# Patient Record
Sex: Female | Born: 1955
Health system: Southern US, Community
[De-identification: ages and names within clinical notes are randomized; demographics above are authoritative.]

## PROBLEM LIST (undated history)

## (undated) DIAGNOSIS — R0981 Nasal congestion: Secondary | ICD-10-CM

## (undated) DIAGNOSIS — C50912 Malignant neoplasm of unspecified site of left female breast: Secondary | ICD-10-CM

## (undated) DIAGNOSIS — E559 Vitamin D deficiency, unspecified: Secondary | ICD-10-CM

## (undated) DIAGNOSIS — I10 Essential (primary) hypertension: Secondary | ICD-10-CM

## (undated) DIAGNOSIS — E785 Hyperlipidemia, unspecified: Secondary | ICD-10-CM

## (undated) DIAGNOSIS — R002 Palpitations: Secondary | ICD-10-CM

## (undated) DIAGNOSIS — R059 Cough, unspecified: Secondary | ICD-10-CM

## (undated) DIAGNOSIS — M2550 Pain in unspecified joint: Secondary | ICD-10-CM

## (undated) DIAGNOSIS — N83209 Unspecified ovarian cyst, unspecified side: Secondary | ICD-10-CM

## (undated) DIAGNOSIS — F419 Anxiety disorder, unspecified: Secondary | ICD-10-CM

## (undated) DIAGNOSIS — R05 Cough: Secondary | ICD-10-CM

## (undated) DIAGNOSIS — N979 Female infertility, unspecified: Secondary | ICD-10-CM

## (undated) DIAGNOSIS — Z923 Personal history of irradiation: Secondary | ICD-10-CM

## (undated) HISTORY — DX: Pain in unspecified joint: M25.50

## (undated) HISTORY — DX: Vitamin D deficiency, unspecified: E55.9

## (undated) HISTORY — DX: Palpitations: R00.2

## (undated) HISTORY — DX: Essential (primary) hypertension: I10

## (undated) HISTORY — DX: Female infertility, unspecified: N97.9

## (undated) HISTORY — DX: Hyperlipidemia, unspecified: E78.5

---

## 1988-06-02 HISTORY — PX: DILATION AND CURETTAGE OF UTERUS: SHX78

## 1997-08-03 ENCOUNTER — Ambulatory Visit (HOSPITAL_COMMUNITY): Admission: RE | Admit: 1997-08-03 | Discharge: 1997-08-03 | Payer: Self-pay | Admitting: Family Medicine

## 1998-12-21 ENCOUNTER — Ambulatory Visit (HOSPITAL_COMMUNITY): Admission: RE | Admit: 1998-12-21 | Discharge: 1998-12-21 | Payer: Self-pay | Admitting: Family Medicine

## 2000-01-15 ENCOUNTER — Ambulatory Visit (HOSPITAL_COMMUNITY): Admission: RE | Admit: 2000-01-15 | Discharge: 2000-01-15 | Payer: Self-pay | Admitting: Family Medicine

## 2000-01-15 ENCOUNTER — Encounter: Payer: Self-pay | Admitting: Family Medicine

## 2000-08-31 ENCOUNTER — Encounter: Admission: RE | Admit: 2000-08-31 | Discharge: 2000-11-29 | Payer: Self-pay | Admitting: Family Medicine

## 2001-09-27 ENCOUNTER — Encounter: Payer: Self-pay | Admitting: Family Medicine

## 2001-09-27 ENCOUNTER — Ambulatory Visit (HOSPITAL_COMMUNITY): Admission: RE | Admit: 2001-09-27 | Discharge: 2001-09-27 | Payer: Self-pay | Admitting: Family Medicine

## 2001-10-22 ENCOUNTER — Other Ambulatory Visit: Admission: RE | Admit: 2001-10-22 | Discharge: 2001-10-22 | Payer: Self-pay | Admitting: Family Medicine

## 2003-01-02 ENCOUNTER — Encounter: Payer: Self-pay | Admitting: Family Medicine

## 2003-01-02 ENCOUNTER — Ambulatory Visit (HOSPITAL_COMMUNITY): Admission: RE | Admit: 2003-01-02 | Discharge: 2003-01-02 | Payer: Self-pay | Admitting: Family Medicine

## 2003-06-21 ENCOUNTER — Other Ambulatory Visit: Admission: RE | Admit: 2003-06-21 | Discharge: 2003-06-21 | Payer: Self-pay | Admitting: Family Medicine

## 2004-07-04 ENCOUNTER — Ambulatory Visit (HOSPITAL_COMMUNITY): Admission: RE | Admit: 2004-07-04 | Discharge: 2004-07-04 | Payer: Self-pay | Admitting: Family Medicine

## 2004-08-06 ENCOUNTER — Other Ambulatory Visit: Admission: RE | Admit: 2004-08-06 | Discharge: 2004-08-06 | Payer: Self-pay | Admitting: Family Medicine

## 2005-03-19 ENCOUNTER — Other Ambulatory Visit: Admission: RE | Admit: 2005-03-19 | Discharge: 2005-03-19 | Payer: Self-pay | Admitting: Obstetrics and Gynecology

## 2005-08-27 ENCOUNTER — Ambulatory Visit (HOSPITAL_COMMUNITY): Admission: RE | Admit: 2005-08-27 | Discharge: 2005-08-27 | Payer: Self-pay | Admitting: Family Medicine

## 2005-09-23 ENCOUNTER — Encounter: Admission: RE | Admit: 2005-09-23 | Discharge: 2005-10-08 | Payer: Self-pay | Admitting: Family Medicine

## 2005-09-25 ENCOUNTER — Other Ambulatory Visit: Admission: RE | Admit: 2005-09-25 | Discharge: 2005-09-25 | Payer: Self-pay | Admitting: Obstetrics and Gynecology

## 2006-02-11 ENCOUNTER — Ambulatory Visit (HOSPITAL_COMMUNITY): Admission: RE | Admit: 2006-02-11 | Discharge: 2006-02-11 | Payer: Self-pay | Admitting: Gastroenterology

## 2006-02-11 ENCOUNTER — Encounter (INDEPENDENT_AMBULATORY_CARE_PROVIDER_SITE_OTHER): Payer: Self-pay | Admitting: Specialist

## 2006-09-02 ENCOUNTER — Ambulatory Visit (HOSPITAL_COMMUNITY): Admission: RE | Admit: 2006-09-02 | Discharge: 2006-09-02 | Payer: Self-pay | Admitting: Family Medicine

## 2007-11-25 ENCOUNTER — Ambulatory Visit (HOSPITAL_COMMUNITY): Admission: RE | Admit: 2007-11-25 | Discharge: 2007-11-25 | Payer: Self-pay | Admitting: Family Medicine

## 2008-03-27 ENCOUNTER — Other Ambulatory Visit: Admission: RE | Admit: 2008-03-27 | Discharge: 2008-03-27 | Payer: Self-pay | Admitting: Family Medicine

## 2008-06-13 ENCOUNTER — Ambulatory Visit: Payer: Self-pay | Admitting: Family Medicine

## 2008-10-02 ENCOUNTER — Emergency Department (HOSPITAL_COMMUNITY): Admission: EM | Admit: 2008-10-02 | Discharge: 2008-10-02 | Payer: Self-pay | Admitting: Emergency Medicine

## 2009-01-11 ENCOUNTER — Ambulatory Visit (HOSPITAL_COMMUNITY): Admission: RE | Admit: 2009-01-11 | Discharge: 2009-01-11 | Payer: Self-pay | Admitting: Family Medicine

## 2009-03-27 ENCOUNTER — Encounter: Payer: Self-pay | Admitting: Internal Medicine

## 2009-04-13 DIAGNOSIS — E669 Obesity, unspecified: Secondary | ICD-10-CM

## 2009-04-13 DIAGNOSIS — J309 Allergic rhinitis, unspecified: Secondary | ICD-10-CM | POA: Insufficient documentation

## 2009-04-13 DIAGNOSIS — E7800 Pure hypercholesterolemia, unspecified: Secondary | ICD-10-CM | POA: Insufficient documentation

## 2009-04-13 DIAGNOSIS — F411 Generalized anxiety disorder: Secondary | ICD-10-CM | POA: Insufficient documentation

## 2009-04-13 DIAGNOSIS — F329 Major depressive disorder, single episode, unspecified: Secondary | ICD-10-CM

## 2009-04-13 DIAGNOSIS — I10 Essential (primary) hypertension: Secondary | ICD-10-CM | POA: Insufficient documentation

## 2009-04-16 ENCOUNTER — Ambulatory Visit: Payer: Self-pay | Admitting: Internal Medicine

## 2009-04-16 DIAGNOSIS — R002 Palpitations: Secondary | ICD-10-CM | POA: Insufficient documentation

## 2009-04-19 ENCOUNTER — Ambulatory Visit: Payer: Self-pay

## 2009-05-03 ENCOUNTER — Ambulatory Visit (HOSPITAL_COMMUNITY): Admission: RE | Admit: 2009-05-03 | Discharge: 2009-05-03 | Payer: Self-pay | Admitting: Internal Medicine

## 2009-05-03 ENCOUNTER — Encounter: Payer: Self-pay | Admitting: Internal Medicine

## 2009-05-03 ENCOUNTER — Ambulatory Visit: Payer: Self-pay

## 2009-05-03 ENCOUNTER — Ambulatory Visit: Payer: Self-pay | Admitting: Cardiology

## 2009-05-10 ENCOUNTER — Other Ambulatory Visit: Admission: RE | Admit: 2009-05-10 | Discharge: 2009-05-10 | Payer: Self-pay | Admitting: Family Medicine

## 2009-06-04 ENCOUNTER — Ambulatory Visit: Payer: Self-pay | Admitting: Internal Medicine

## 2010-02-07 ENCOUNTER — Ambulatory Visit (HOSPITAL_COMMUNITY): Admission: RE | Admit: 2010-02-07 | Discharge: 2010-02-07 | Payer: Self-pay | Admitting: Family Medicine

## 2010-07-04 ENCOUNTER — Other Ambulatory Visit: Payer: Self-pay | Admitting: Family Medicine

## 2010-07-04 ENCOUNTER — Other Ambulatory Visit (HOSPITAL_COMMUNITY)
Admission: RE | Admit: 2010-07-04 | Discharge: 2010-07-04 | Disposition: A | Discharge status: Home / Self Care | Payer: 59 | Source: Ambulatory Visit | Attending: Family Medicine | Admitting: Family Medicine

## 2010-07-04 DIAGNOSIS — Z124 Encounter for screening for malignant neoplasm of cervix: Secondary | ICD-10-CM | POA: Insufficient documentation

## 2010-07-04 NOTE — Letter (Signed)
Summary: [facility]   Imported By: Kassie Mends 07/23/2009 13:11:29  _____________________________________________________________________  External Attachment:    Type:   Image     Comment:   External Document

## 2010-07-04 NOTE — Assessment & Plan Note (Signed)
Summary: 6wk f/u pt needed to wait until 1st week in january/sl   Visit Type:  Follow-up Referring Provider:  Mila Palmer, MD Primary Provider:  Shaune Pollack, MD [facility])   History of Present Illness: The patient presents today for routine electrophysiology followup. She reports doing very well since last being seen in our clinic. Her palpitations have resolved.  The patient denies symptoms of chest pain, shortness of breath, orthopnea, PND, lower extremity edema, dizziness, presyncope, syncope, or neurologic sequela. The patient is tolerating medications without difficulties and is otherwise without complaint today.   She has significant family/ social stress but feels that this is improving.  Current Medications (verified): 1)  Lisinopril 10 Mg Tabs (Lisinopril) .... Take One Tablet By Mouth Daily 2)  Simvastatin 40 Mg Tabs (Simvastatin) .... Take One Tablet By Mouth Daily At Bedtime 3)  Vitamin D (Ergocalciferol) 50000 Unit Caps (Ergocalciferol) .... Weekly 4)  Multivitamins   Tabs (Multiple Vitamin) .... Once Daily -- Sometimes 5)  Wellbutrin  150 Mg Xr24h-Tab (Bupropion Hcl) .... Take One Tablet By Mouth Once Daily.  Allergies (verified): No Known Drug Allergies  Past History:  Past Medical History: hypercholesterolemia allergic rhinitis hypertension depression obesity palpitations  Social History: Reviewed history from 04/16/2009 and no changes required. Pt lives in [place] with husband and 2 daughters. 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 Tobacco Use - quit 1982 Alcohol Use - rare (once per month) Drug Use - none  Vital Signs:  Patient profile:   54 year old female Height:      64 inches Weight:      193 pounds BMI:     33.25 Pulse rate:   86 / minute BP sitting:   132 / 82  Vitals Entered By: Laurance Flatten CMA (June 04, 2009 4:38 PM)  Physical Exam  General:  tearful, obese, NAD Head:  normocephalic and atraumatic Eyes:   PERRLA/EOM intact; conjunctiva and lids normal. Nose:  no deformity, discharge, inflammation, or lesions Mouth:  Teeth, gums and palate normal. Oral mucosa normal. Neck:  Neck supple, no JVD. No masses, thyromegaly or abnormal cervical nodes. Lungs:  Clear bilaterally to auscultation and percussion. Heart:  Non-displaced PMI, chest non-tender; regular rate and rhythm, S1, S2 without murmurs, rubs or gallops. Carotid upstroke normal, no bruit. Normal abdominal aortic size, no bruits. Femorals normal pulses, no bruits. Pedals normal pulses. No edema, no varicosities. Abdomen:  Bowel sounds positive; abdomen soft and non-tender without masses, organomegaly, or hernias noted. No hepatosplenomegaly. Msk:  Back normal, normal gait. Muscle strength and tone normal. Pulses:  pulses normal in all 4 extremities Extremities:  No clubbing or cyanosis. Neurologic:  Alert and oriented x 3. Skin:  Intact without lesions or rashes. Cervical Nodes:  no significant adenopathy Psych:  tearful,  denies SI/HI   Impression & Recommendations:  Problem # 1:  PALPITATIONS (ICD-785.1) Recent event monitor was unremarkable.  No arrhythmias documented.  Her echo reveals no significant abnormalities. Her symptoms have improved.  NO further follow-up is recommended.  Problem # 2:  OBESITY (ICD-278.00) lifestyle modification advised  Problem # 3:  HYPERTENSION (ICD-401.9) stable, no change  The following medications were removed from the medication list:    Aspirin 81 Mg Tbec (Aspirin) .[name]... Take one tablet by mouth every other day Her updated medication list for this problem includes:    Lisinopril 10 Mg Tabs (Lisinopril) .[name]... Take one tablet by mouth daily  Problem # 4:  DEPRESSION (ICD-311) Pt to follow-up with  PCP  Patient Instructions: 1)  return as needed

## 2010-10-18 NOTE — Op Note (Signed)
NAMEAILEY, Connie Clarke                ACCOUNT NO.:  [id number]   MEDICAL RECORD NO.:  [id number]          PATIENT TYPE:  AMB   LOCATION:  ENDO                         FACILITY:  [facility]   PHYSICIAN:  Bernette Redbird, M.D.   DATE OF BIRTH:  08-Mar-1956   DATE OF PROCEDURE:  02/11/2006  DATE OF DISCHARGE:                                 OPERATIVE REPORT   PROCEDURE:  Colonoscopy with biopsy.   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  There is a family history of colon cancer in her  maternal grandfather, probably when he was in his 32s.   FINDINGS:  Diminutive rectal polyp.   PROCEDURE:  The nature, purpose, risks of the procedure had been discussed  with the patient through our open access program, and I reviewed the purpose  and risks with her prior to the procedure.  She had provided written  consent.   SEDATION:  Prior to and during the course of the procedure totaled fentanyl  25 mg, fentanyl 75 mcg and Versed 6 mg IV without arrhythmias or  desaturation.   The Olympus adjustable tension pediatric video colonoscope was advanced with  slight difficulty through a slightly fixated and looping sigmoid region, and  then fairly easily around the colon to the cecum, as identified by clear  visualization of the appendiceal orifice and ileocecal valve.  The terminal  ileum was not entered.  The quality of prep was excellent and it is felt  that all areas were well seen.   In the rectum was a 3-mm sessile polyp, removed by several cold biopsies.  No other polyps were seen, and there was no evidence of cancer, colitis,  vascular malformations or diverticulosis.  Retroflexion in the rectum, prior  to the biopsy was negative, and reinspection of the rectum showed no  additional abnormalities.   The patient tolerated the procedure well and there no apparent  complications.   IMPRESSION:  1. Solitary diminutive rectal polyp (211.4).  2. Remote family history of colon cancer.   PLAN:  1. Await pathology results.  2. Probable colonoscopic follow-up in 5 years in view of the family      history, regardless of the histology of her polyp.           ______________________________  Bernette Redbird, M.D.     RB/MEDQ  D:  02/11/2006  T:  02/11/2006  Job:  [id number]   cc:   Duncan Dull, M.D.

## 2011-03-04 ENCOUNTER — Other Ambulatory Visit (HOSPITAL_COMMUNITY): Payer: Self-pay | Admitting: Family Medicine

## 2011-03-04 DIAGNOSIS — Z1231 Encounter for screening mammogram for malignant neoplasm of breast: Secondary | ICD-10-CM

## 2011-03-05 ENCOUNTER — Ambulatory Visit (HOSPITAL_COMMUNITY)
Admission: RE | Admit: 2011-03-05 | Discharge: 2011-03-05 | Disposition: A | Discharge status: Home / Self Care | Payer: 59 | Source: Ambulatory Visit | Attending: Family Medicine | Admitting: Family Medicine

## 2011-03-05 DIAGNOSIS — Z1231 Encounter for screening mammogram for malignant neoplasm of breast: Secondary | ICD-10-CM | POA: Insufficient documentation

## 2012-06-07 ENCOUNTER — Other Ambulatory Visit (HOSPITAL_COMMUNITY): Payer: Self-pay | Admitting: Family Medicine

## 2012-06-07 DIAGNOSIS — Z1231 Encounter for screening mammogram for malignant neoplasm of breast: Secondary | ICD-10-CM

## 2012-06-16 ENCOUNTER — Ambulatory Visit (HOSPITAL_COMMUNITY)
Admission: RE | Admit: 2012-06-16 | Discharge: 2012-06-16 | Disposition: A | Discharge status: Home / Self Care | Payer: 59 | Source: Ambulatory Visit | Attending: Family Medicine | Admitting: Family Medicine

## 2012-06-16 DIAGNOSIS — Z1231 Encounter for screening mammogram for malignant neoplasm of breast: Secondary | ICD-10-CM | POA: Insufficient documentation

## 2013-07-13 ENCOUNTER — Other Ambulatory Visit (HOSPITAL_COMMUNITY)
Admission: RE | Admit: 2013-07-13 | Discharge: 2013-07-13 | Disposition: A | Discharge status: Home / Self Care | Payer: 59 | Source: Ambulatory Visit | Attending: Family Medicine | Admitting: Family Medicine

## 2013-07-13 ENCOUNTER — Other Ambulatory Visit: Payer: Self-pay | Admitting: Family Medicine

## 2013-07-13 DIAGNOSIS — Z124 Encounter for screening for malignant neoplasm of cervix: Secondary | ICD-10-CM | POA: Insufficient documentation

## 2013-07-18 ENCOUNTER — Other Ambulatory Visit (HOSPITAL_COMMUNITY): Payer: Self-pay | Admitting: Family Medicine

## 2013-07-18 DIAGNOSIS — Z1231 Encounter for screening mammogram for malignant neoplasm of breast: Secondary | ICD-10-CM

## 2013-07-25 ENCOUNTER — Ambulatory Visit (HOSPITAL_COMMUNITY)
Admission: RE | Admit: 2013-07-25 | Discharge: 2013-07-25 | Disposition: A | Discharge status: Home / Self Care | Payer: 59 | Source: Ambulatory Visit | Attending: Family Medicine | Admitting: Family Medicine

## 2013-07-25 DIAGNOSIS — Z1231 Encounter for screening mammogram for malignant neoplasm of breast: Secondary | ICD-10-CM | POA: Insufficient documentation

## 2014-07-10 ENCOUNTER — Other Ambulatory Visit (HOSPITAL_COMMUNITY): Payer: Self-pay | Admitting: Family Medicine

## 2014-07-10 DIAGNOSIS — Z1231 Encounter for screening mammogram for malignant neoplasm of breast: Secondary | ICD-10-CM

## 2014-08-02 ENCOUNTER — Ambulatory Visit (HOSPITAL_COMMUNITY)
Admission: RE | Admit: 2014-08-02 | Discharge: 2014-08-02 | Disposition: A | Discharge status: Home / Self Care | Payer: 59 | Source: Ambulatory Visit | Attending: Family Medicine | Admitting: Family Medicine

## 2014-08-02 DIAGNOSIS — Z1231 Encounter for screening mammogram for malignant neoplasm of breast: Secondary | ICD-10-CM | POA: Diagnosis present

## 2015-06-18 DIAGNOSIS — Z9889 Other specified postprocedural states: Secondary | ICD-10-CM | POA: Diagnosis not present

## 2015-06-18 DIAGNOSIS — M25561 Pain in right knee: Secondary | ICD-10-CM | POA: Diagnosis not present

## 2015-06-20 DIAGNOSIS — M25561 Pain in right knee: Secondary | ICD-10-CM | POA: Diagnosis not present

## 2015-06-20 DIAGNOSIS — Z9889 Other specified postprocedural states: Secondary | ICD-10-CM | POA: Diagnosis not present

## 2015-06-25 DIAGNOSIS — M25561 Pain in right knee: Secondary | ICD-10-CM | POA: Diagnosis not present

## 2015-06-25 DIAGNOSIS — Z9889 Other specified postprocedural states: Secondary | ICD-10-CM | POA: Diagnosis not present

## 2015-06-27 DIAGNOSIS — M25561 Pain in right knee: Secondary | ICD-10-CM | POA: Diagnosis not present

## 2015-06-27 DIAGNOSIS — Z9889 Other specified postprocedural states: Secondary | ICD-10-CM | POA: Diagnosis not present

## 2015-06-28 MED FILL — ESTRADIOL 0.075 MG PATCH: 0.075 | 56 days supply | Qty: 16 | Fill #0

## 2015-06-28 MED FILL — PROGESTERONE 100 MG CAPSULE: 100 | 30 days supply | Qty: 90 | Fill #0

## 2015-07-10 DIAGNOSIS — L814 Other melanin hyperpigmentation: Secondary | ICD-10-CM | POA: Diagnosis not present

## 2015-07-10 DIAGNOSIS — L720 Epidermal cyst: Secondary | ICD-10-CM | POA: Diagnosis not present

## 2015-07-10 DIAGNOSIS — D1801 Hemangioma of skin and subcutaneous tissue: Secondary | ICD-10-CM | POA: Diagnosis not present

## 2015-07-11 DIAGNOSIS — H52223 Regular astigmatism, bilateral: Secondary | ICD-10-CM | POA: Diagnosis not present

## 2015-07-11 DIAGNOSIS — H25013 Cortical age-related cataract, bilateral: Secondary | ICD-10-CM | POA: Diagnosis not present

## 2015-07-11 DIAGNOSIS — H5213 Myopia, bilateral: Secondary | ICD-10-CM | POA: Diagnosis not present

## 2015-07-11 DIAGNOSIS — H40013 Open angle with borderline findings, low risk, bilateral: Secondary | ICD-10-CM | POA: Diagnosis not present

## 2015-07-16 DIAGNOSIS — Z01419 Encounter for gynecological examination (general) (routine) without abnormal findings: Secondary | ICD-10-CM | POA: Diagnosis not present

## 2015-07-16 DIAGNOSIS — N840 Polyp of corpus uteri: Secondary | ICD-10-CM | POA: Diagnosis not present

## 2015-07-16 DIAGNOSIS — N889 Noninflammatory disorder of cervix uteri, unspecified: Secondary | ICD-10-CM | POA: Diagnosis not present

## 2015-07-23 ENCOUNTER — Other Ambulatory Visit: Payer: Self-pay

## 2015-07-23 DIAGNOSIS — Z1231 Encounter for screening mammogram for malignant neoplasm of breast: Secondary | ICD-10-CM

## 2015-07-23 DIAGNOSIS — Z13820 Encounter for screening for osteoporosis: Secondary | ICD-10-CM | POA: Diagnosis not present

## 2015-07-30 MED FILL — PROGESTERONE 100 MG CAPSULE: 100 | 30 days supply | Qty: 90 | Fill #1

## 2015-08-07 DIAGNOSIS — F39 Unspecified mood [affective] disorder: Secondary | ICD-10-CM | POA: Diagnosis not present

## 2015-08-07 DIAGNOSIS — N951 Menopausal and female climacteric states: Secondary | ICD-10-CM | POA: Diagnosis not present

## 2015-08-07 DIAGNOSIS — E559 Vitamin D deficiency, unspecified: Secondary | ICD-10-CM | POA: Diagnosis not present

## 2015-08-07 DIAGNOSIS — R7302 Impaired glucose tolerance (oral): Secondary | ICD-10-CM | POA: Diagnosis not present

## 2015-08-09 MED FILL — LISINOPRIL 10 MG TABLET: 10 | 90 days supply | Qty: 90 | Fill #0

## 2015-08-10 ENCOUNTER — Ambulatory Visit: Payer: 59

## 2015-08-13 ENCOUNTER — Ambulatory Visit
Admission: RE | Admit: 2015-08-13 | Discharge: 2015-08-13 | Disposition: A | Discharge status: Home / Self Care | Payer: 59 | Source: Ambulatory Visit

## 2015-08-13 DIAGNOSIS — Z1231 Encounter for screening mammogram for malignant neoplasm of breast: Secondary | ICD-10-CM | POA: Diagnosis not present

## 2015-08-13 MED FILL — ESTRADIOL 0.1 MG PATCH: 0.1 | 28 days supply | Qty: 8 | Fill #0

## 2015-08-13 MED FILL — NATURE-THROID 32.5 MG TAB: 32.5 | 30 days supply | Qty: 30 | Fill #0

## 2015-08-31 DIAGNOSIS — H5213 Myopia, bilateral: Secondary | ICD-10-CM | POA: Diagnosis not present

## 2015-08-31 DIAGNOSIS — Z83511 Family history of glaucoma: Secondary | ICD-10-CM | POA: Diagnosis not present

## 2015-08-31 DIAGNOSIS — H524 Presbyopia: Secondary | ICD-10-CM | POA: Diagnosis not present

## 2015-08-31 DIAGNOSIS — H52223 Regular astigmatism, bilateral: Secondary | ICD-10-CM | POA: Diagnosis not present

## 2015-08-31 DIAGNOSIS — H40013 Open angle with borderline findings, low risk, bilateral: Secondary | ICD-10-CM | POA: Diagnosis not present

## 2015-09-05 MED FILL — PROGESTERONE 100 MG CAPSULE: 100 | 30 days supply | Qty: 90 | Fill #2

## 2015-09-10 DIAGNOSIS — F329 Major depressive disorder, single episode, unspecified: Secondary | ICD-10-CM | POA: Diagnosis not present

## 2015-09-10 DIAGNOSIS — Z8639 Personal history of other endocrine, nutritional and metabolic disease: Secondary | ICD-10-CM | POA: Diagnosis not present

## 2015-09-10 DIAGNOSIS — Z0000 Encounter for general adult medical examination without abnormal findings: Secondary | ICD-10-CM | POA: Diagnosis not present

## 2015-09-10 DIAGNOSIS — E7800 Pure hypercholesterolemia, unspecified: Secondary | ICD-10-CM | POA: Diagnosis not present

## 2015-09-10 DIAGNOSIS — E559 Vitamin D deficiency, unspecified: Secondary | ICD-10-CM | POA: Diagnosis not present

## 2015-09-10 DIAGNOSIS — R0683 Snoring: Secondary | ICD-10-CM | POA: Diagnosis not present

## 2015-09-10 DIAGNOSIS — I10 Essential (primary) hypertension: Secondary | ICD-10-CM | POA: Diagnosis not present

## 2015-09-10 LAB — LIPID PANEL
CHOLESTEROL: 201 — AB (ref 0–200)
HDL: 53 (ref 35–70)
LDL CALC: 133
TRIGLYCERIDES: 74 (ref 40–160)

## 2015-09-10 LAB — VITAMIN D 25 HYDROXY (VIT D DEFICIENCY, FRACTURES): Vit D, 25-Hydroxy: 45.9

## 2015-09-10 LAB — HEPATIC FUNCTION PANEL
ALK PHOS: 66 (ref 25–125)
ALT: 24 (ref 7–35)
AST: 17 (ref 13–35)
BILIRUBIN, TOTAL: 0.6

## 2015-09-10 LAB — BASIC METABOLIC PANEL
BUN: 27 — AB (ref 4–21)
Creatinine: 0.8 (ref 0.5–1.1)
Glucose: 100
Potassium: 4.9 (ref 3.4–5.3)
SODIUM: 136 — AB (ref 137–147)

## 2015-09-10 LAB — HEMOGLOBIN A1C: Hemoglobin A1C: 5.4

## 2015-09-10 LAB — TSH: TSH: 1.19 (ref 0.41–5.90)

## 2015-10-08 MED FILL — PROGESTERONE 100 MG CAPSULE: 100 | 30 days supply | Qty: 90 | Fill #3

## 2015-10-08 MED FILL — ESTRADIOL 0.075 MG PATCH: 0.075 | 28 days supply | Qty: 8 | Fill #0

## 2015-11-06 MED FILL — PROGESTERONE 100 MG CAPSULE: 100 | 30 days supply | Qty: 90 | Fill #4

## 2015-11-07 MED FILL — ESTRADIOL 0.075 MG PATCH: 0.075 | 28 days supply | Qty: 8 | Fill #1

## 2015-11-09 DIAGNOSIS — F39 Unspecified mood [affective] disorder: Secondary | ICD-10-CM | POA: Diagnosis not present

## 2015-11-09 DIAGNOSIS — Z8639 Personal history of other endocrine, nutritional and metabolic disease: Secondary | ICD-10-CM | POA: Diagnosis not present

## 2015-11-09 DIAGNOSIS — E559 Vitamin D deficiency, unspecified: Secondary | ICD-10-CM | POA: Diagnosis not present

## 2015-11-09 DIAGNOSIS — E669 Obesity, unspecified: Secondary | ICD-10-CM | POA: Diagnosis not present

## 2015-11-09 DIAGNOSIS — N951 Menopausal and female climacteric states: Secondary | ICD-10-CM | POA: Diagnosis not present

## 2015-11-09 DIAGNOSIS — R5383 Other fatigue: Secondary | ICD-10-CM | POA: Diagnosis not present

## 2015-11-09 DIAGNOSIS — R7302 Impaired glucose tolerance (oral): Secondary | ICD-10-CM | POA: Diagnosis not present

## 2015-11-27 MED FILL — PROGESTERONE 100 MG CAPSULE: 100 | 30 days supply | Qty: 90 | Fill #5

## 2015-11-27 MED FILL — LISINOPRIL 10 MG TABLET: 10 | 90 days supply | Qty: 90 | Fill #0

## 2015-12-11 MED FILL — ESTRADIOL 0.075 MG PATCH: 0.075 | 28 days supply | Qty: 8 | Fill #2

## 2016-01-04 MED FILL — ESTRADIOL 0.075 MG PATCH: 0.075 | 28 days supply | Qty: 8 | Fill #3

## 2016-01-08 MED FILL — PROGESTERONE 100 MG CAPSULE: 100 | 30 days supply | Qty: 90 | Fill #0

## 2016-02-15 MED FILL — ESTRADIOL 0.075 MG PATCH: 0.075 | 28 days supply | Qty: 8 | Fill #4

## 2016-03-12 MED FILL — PROGESTERONE 100 MG CAPSULE: 100 | 30 days supply | Qty: 90 | Fill #1

## 2016-03-12 MED FILL — LISINOPRIL 10 MG TABLET: 10 | 90 days supply | Qty: 90 | Fill #1

## 2016-03-12 MED FILL — ESTRADIOL 0.075 MG PATCH: 0.075 | 28 days supply | Qty: 8 | Fill #5

## 2016-04-09 MED FILL — PROGESTERONE 100 MG CAPSULE: 100 | 30 days supply | Qty: 90 | Fill #2

## 2016-04-09 MED FILL — ESTRADIOL 0.075 MG PATCH: 0.075 | 28 days supply | Qty: 8 | Fill #6

## 2016-05-12 DIAGNOSIS — N951 Menopausal and female climacteric states: Secondary | ICD-10-CM | POA: Diagnosis not present

## 2016-05-12 MED FILL — ESTRADIOL 0.05 MG PATCH: 0.05 | 84 days supply | Qty: 24 | Fill #0

## 2016-05-15 ENCOUNTER — Encounter (INDEPENDENT_AMBULATORY_CARE_PROVIDER_SITE_OTHER): Payer: 59 | Admitting: Family Medicine

## 2016-05-15 MED FILL — PROGESTERONE 100 MG CAPSULE: 100 | 90 days supply | Qty: 90 | Fill #0

## 2016-05-15 MED FILL — LISINOPRIL 10 MG TABLET: 10 | 90 days supply | Qty: 90 | Fill #2

## 2016-05-21 ENCOUNTER — Ambulatory Visit (INDEPENDENT_AMBULATORY_CARE_PROVIDER_SITE_OTHER): Payer: 59 | Admitting: Family Medicine

## 2016-05-21 ENCOUNTER — Encounter (INDEPENDENT_AMBULATORY_CARE_PROVIDER_SITE_OTHER): Payer: Self-pay | Admitting: Family Medicine

## 2016-05-21 VITALS — BP 146/76 | HR 81 | Temp 97.1°F | Resp 12 | Ht 64.0 in | Wt 201.0 lb

## 2016-05-21 DIAGNOSIS — Z1331 Encounter for screening for depression: Secondary | ICD-10-CM

## 2016-05-21 DIAGNOSIS — I10 Essential (primary) hypertension: Secondary | ICD-10-CM | POA: Diagnosis not present

## 2016-05-21 DIAGNOSIS — Z6834 Body mass index (BMI) 34.0-34.9, adult: Secondary | ICD-10-CM | POA: Diagnosis not present

## 2016-05-21 DIAGNOSIS — R0602 Shortness of breath: Secondary | ICD-10-CM

## 2016-05-21 DIAGNOSIS — Z1389 Encounter for screening for other disorder: Secondary | ICD-10-CM

## 2016-05-21 DIAGNOSIS — E669 Obesity, unspecified: Secondary | ICD-10-CM

## 2016-05-21 DIAGNOSIS — E559 Vitamin D deficiency, unspecified: Secondary | ICD-10-CM

## 2016-05-21 DIAGNOSIS — Z9189 Other specified personal risk factors, not elsewhere classified: Secondary | ICD-10-CM | POA: Diagnosis not present

## 2016-05-21 DIAGNOSIS — R5383 Other fatigue: Secondary | ICD-10-CM | POA: Diagnosis not present

## 2016-05-21 NOTE — Progress Notes (Signed)
Office: [phone]  /  Fax: [phone]   HPI:   Chief Complaint: OBESITY  Connie Clarke (MR# [id number]) is a 60 y.o. female who presents on 05/22/2016 for obesity evaluation and treatment. Current BMI is Body mass index is 34.5 kg/m.[name] Connie Clarke has struggled with obesity for years and has been unsuccessful in either losing weight or maintaining long term weight loss. Connie Clarke attended our information session and states she is currently in the action stage of change and ready to dedicate time achieving and maintaining a healthier weight.  Connie Clarke states she has been heavy most of  her life,  Connie Clarke out frequently, notes sweet cravings, eats fast Clarke frequently, often eats larger meals than normal, is embarrassed by hear eating and feels judged.   Connie Clarke Connie Clarke feels her energy is lower than it should be. This has worsened with weight gain and has not worsened recently. Connie Clarke reports daytime somnolence and  reports waking up still tired. Connie Clarke is at risk for obstructive sleep apnea. Patent has a history of symptoms of daytime Connie Clarke. Connie Clarke generally gets 8 hours of sleep per night, and states they generally have generally restful sleep. Snoring is present. Apneic episodes is not present. Epworth Sleepiness Score is 10.  Dyspnea on exertion [name] notes increasing shortness of breath with exercising and seems to be worsening over time with weight gain. She notes getting out of breath sooner with activity than she used to. This has not gotten worse recently. Connie Clarke denies orthopnea.  Hypertension Connie Clarke is a 60 y.o. female with hypertension.  She is taking her  medications as instructed, no medication side effects noted, no TIA's, no chest pain on exertion. She is working weight loss to help control her blood pressure with the goal of decreasing her BP medications and decreasing her risk of heart attack and stroke. Connie Clarke blood pressure is not currently controlled today but she states  this is not her normal BP.  Vitamin D deficiency Connie Clarke has a diagnosis of vitamin D deficiency. She is currently taking vit D and denies nausea, vomiting or muscle weakness.   Depression Screen Connie Clarke and Mood (modified PHQ-9) score was  Depression screen PHQ 2/9 05/21/2016  Decreased Interest 2  Down, Depressed, Hopeless 3  PHQ - 2 Score 5  Altered sleeping 1  Tired, decreased energy 2  Change in appetite 3  Feeling bad or failure about yourself  3  Trouble concentrating 1  Moving slowly or fidgety/restless 1  Suicidal thoughts 0  PHQ-9 Score 16    ALLERGIES: Allergies not on file  MEDICATIONS: No current outpatient prescriptions on file prior to visit.   No current facility-administered medications on file prior to visit.     PAST MEDICAL HISTORY: Past Medical History:  Diagnosis Date  . Depression   . Hyperlipidemia   . Hypertension   . Infertility, female   . Joint pain   . Palpitations   . Vitamin D deficiency     PAST SURGICAL HISTORY: Past Surgical History:  Procedure Laterality Date  . NO PAST SURGERIES      SOCIAL HISTORY: Social History  Substance Use Topics  . Smoking status: Former Smoker    Quit date: 05/21/2014  . Smokeless tobacco: Never Used  . Alcohol use Not on file    FAMILY HISTORY: Family History  Problem Relation Age of Onset  . Stroke Mother   . Anxiety disorder Mother   . Obesity Mother   . Diabetes Father   .  Hyperlipidemia Father   . Stroke Father   . Depression Father   . Obesity Father     ROS: Review of Systems  Constitutional: Positive for malaise/Connie Clarke.  HENT:       Stuffiness  Eyes:       Wears glasses or contacts  Musculoskeletal: Positive for joint pain (Occasional).  Psychiatric/Behavioral: Positive for depression.       Frequent Tearfullness    PHYSICAL EXAM: Blood pressure (!) 146/76, pulse 81, temperature 97.1 F (36.2 C), temperature source Oral, resp. rate 12, height 5\' 4"  (1.626 m),  weight 201 lb (91.2 kg), last menstrual period 06/02/2005, SpO2 99 %. Body mass index is 34.5 kg/m. Physical Exam  Constitutional: She is oriented to person, place, and time. She appears well-developed and well-nourished.  HENT:  Head: Normocephalic and atraumatic.  Nose: Nose normal.  Eyes: EOM are normal. No scleral icterus.  Neck: Normal range of motion. Neck supple. No thyromegaly present.  Cardiovascular: Normal rate and regular rhythm.   Pulmonary/Chest: Effort normal and breath sounds normal.  Abdominal: Soft. There is no tenderness.  Musculoskeletal: Normal range of motion. She exhibits edema.  Neurological: She is alert and oriented to person, place, and time. Coordination normal.  Skin: Skin is warm and dry.  Psychiatric: She has a normal mood and affect. Her behavior is normal.  Vitals reviewed.   RECENT LABS AND TESTS: BMET    Component Value Date/Time   NA 140 05/21/2016 1504   K 4.6 05/21/2016 1504   CL 101 05/21/2016 1504   CO2 24 05/21/2016 1504   GLUCOSE 80 05/21/2016 1504   BUN 14 05/21/2016 1504   CREATININE 0.72 05/21/2016 1504   CALCIUM 9.6 05/21/2016 1504   GFRNONAA 91 05/21/2016 1504   GFRAA 105 05/21/2016 1504   Lab Results  Component Value Date   HGBA1C 5.4 05/21/2016   Lab Results  Component Value Date   INSULIN WILL FOLLOW 05/21/2016   CBC    Component Value Date/Time   WBC 7.3 05/21/2016 1504   RBC 4.97 05/21/2016 1504   HCT 43.4 05/21/2016 1504   MCV 87 05/21/2016 1504   MCH 29.6 05/21/2016 1504   MCHC 33.9 05/21/2016 1504   RDW 13.1 05/21/2016 1504   LYMPHSABS 1.9 05/21/2016 1504   EOSABS 0.1 05/21/2016 1504   BASOSABS 0.0 05/21/2016 1504   Iron/TIBC/Ferritin/ %Sat No results found for: IRON, TIBC, FERRITIN, IRONPCTSAT Lipid Panel     Component Value Date/Time   CHOL 236 (H) 05/21/2016 1504   TRIG 198 (H) 05/21/2016 1504   HDL 53 05/21/2016 1504   LDLCALC 143 (H) 05/21/2016 1504   Hepatic Function Panel     Component  Value Date/Time   PROT 6.8 05/21/2016 1504   ALBUMIN 4.3 05/21/2016 1504   AST 23 05/21/2016 1504   ALT 43 (H) 05/21/2016 1504   ALKPHOS 85 05/21/2016 1504   BILITOT 0.5 05/21/2016 1504      Component Value Date/Time   TSH 1.230 05/21/2016 1504    ECG  shows NSR with a rate of 76 INDIRECT CALORIMETER done today shows a VO2 of 232 and a REE of 1616.    ASSESSMENT AND PLAN: Other Connie Clarke - Plan: EKG 12-Lead, Comprehensive metabolic panel, Vitamin 123456, T3, T4, free, TSH, Insulin, random, Lipid Panel With LDL/HDL Ratio, Folate, Hemoglobin A1c, CBC With Differential  Shortness of breath - Plan: EKG 12-Lead  Essential hypertension - Plan: EKG 12-Lead, Comprehensive metabolic panel  Vitamin D deficiency - Plan: VITAMIN D  25 Hydroxy (Vit-D Deficiency, Fractures)  At risk for heart disease - Plan: EKG 12-Lead  Depression screening  Class 1 obesity with body mass index (BMI) of 34.0 to 34.9 in adult, unspecified obesity type, unspecified whether serious comorbidity present  PLAN:  Connie Clarke Amry was informed that her Connie Clarke may be related to obesity, depression or many other causes. Labs will be ordered, and in the meanwhile Connie Clarke has agreed to work on diet, exercise and weight loss to help with Connie Clarke. Proper sleep hygiene was discussed including the need for 7-8 hours of quality sleep each night. A sleep study was not ordered based on symptoms and Epworth score.  Exercise Induced Shortness of Breath Connie Clarke shortness of breath appears to be obesity related and exercise induced. She has agreed to work on weight loss and gradually increase exercise to treat her exercise induced shortness of breath. If Connie Clarke follows our instructions and loses weight without improvement of her shortness of breath, we will plan to refer to pulmonology. We will monitor this condition regularly. Connie Clarke agrees to this plan.  Hypertension We discussed sodium restriction, working on healthy weight loss,  and a regular exercise program as the means to achieve improved blood pressure control. Connie Clarke agreed with this plan and agreed to follow up as directed. We will continue to monitor her blood pressure as well as her progress with the above lifestyle modifications. She will continue her medications as prescribed and will watch for signs of hypotension as he continues his lifestyle modifications.   Vitamin D Deficiency Connie Clarke was informed that low vitamin D levels contributes to Connie Clarke and are associated with obesity, will check labs and follow  Risk counselling Cardiovascular risk counselling Connie Clarke was given extended (at least 15 minutes) coronary artery disease prevention counseling today. She is 60 y.o. female and has risk factors for heart disease including obesity. We discussed intensive lifestyle modifications today with an emphasis on specific weight loss instructions and strategies. Pt was also informed of the importance of increasing exercise and decreasing saturated fats to help prevent heart disease.  Depression Screen Connie Clarke had a positive depression screening. Depression is commonly associated with obesity and often results in emotional eating behaviors. We will monitor this closely and work on CBT to help improve the non-hunger eating patterns. Referral to Psychology may be required if no improvement is seen as she continues in our clinic.  Obesity Connie Clarke is currently in the action stage of change and her goal is to continue with weight loss efforts She has agreed to follow the Category 3 plan Connie Clarke has been instructed to work up to a goal of 150 minutes of combined cardio and strengthening exercise per week for weight loss and overall health benefits. We discussed the following Behavioral Modification Stratagies today: decreasing sodium intake  Connie Clarke has agreed to follow up with our clinic in 2 weeks. She was informed of the importance of frequent follow up visits to maximize her  success with intensive lifestyle modifications for her multiple health conditions. She was informed we would discuss her lab results at her next visit unless there is a critical issue that needs to be addressed sooner. Shalette agreed to keep her next visit at the agreed upon time to discuss these results.  I, Doreene Nest, am acting as scribe for Dennard Nip, MD  I have reviewed the above documentation for accuracy and completeness, and I agree with the above. -Dennard Nip, MD

## 2016-05-22 LAB — CBC WITH DIFFERENTIAL
BASOS ABS: 0 10*3/uL (ref 0.0–0.2)
Basos: 0 %
EOS (ABSOLUTE): 0.1 10*3/uL (ref 0.0–0.4)
Eos: 2 %
Hematocrit: 43.4 % (ref 34.0–46.6)
Hemoglobin: 14.7 g/dL (ref 11.1–15.9)
IMMATURE GRANS (ABS): 0 10*3/uL (ref 0.0–0.1)
Immature Granulocytes: 0 %
LYMPHS ABS: 1.9 10*3/uL (ref 0.7–3.1)
LYMPHS: 26 %
MCH: 29.6 pg (ref 26.6–33.0)
MCHC: 33.9 g/dL (ref 31.5–35.7)
MCV: 87 fL (ref 79–97)
Monocytes Absolute: 0.5 10*3/uL (ref 0.1–0.9)
Monocytes: 6 %
NEUTROS ABS: 4.8 10*3/uL (ref 1.4–7.0)
NEUTROS PCT: 66 %
RBC: 4.97 x10E6/uL (ref 3.77–5.28)
RDW: 13.1 % (ref 12.3–15.4)
WBC: 7.3 10*3/uL (ref 3.4–10.8)

## 2016-05-22 LAB — COMPREHENSIVE METABOLIC PANEL
ALK PHOS: 85 IU/L (ref 39–117)
ALT: 43 IU/L — AB (ref 0–32)
AST: 23 IU/L (ref 0–40)
Albumin/Globulin Ratio: 1.7 (ref 1.2–2.2)
Albumin: 4.3 g/dL (ref 3.6–4.8)
BUN/Creatinine Ratio: 19 (ref 12–28)
BUN: 14 mg/dL (ref 8–27)
Bilirubin Total: 0.5 mg/dL (ref 0.0–1.2)
CO2: 24 mmol/L (ref 18–29)
CREATININE: 0.72 mg/dL (ref 0.57–1.00)
Calcium: 9.6 mg/dL (ref 8.7–10.3)
Chloride: 101 mmol/L (ref 96–106)
GFR calc Af Amer: 105 mL/min/{1.73_m2} (ref >59)
GFR calc non Af Amer: 91 mL/min/{1.73_m2} (ref >59)
GLOBULIN, TOTAL: 2.5 g/dL (ref 1.5–4.5)
GLUCOSE: 80 mg/dL (ref 65–99)
Potassium: 4.6 mmol/L (ref 3.5–5.2)
SODIUM: 140 mmol/L (ref 134–144)
Total Protein: 6.8 g/dL (ref 6.0–8.5)

## 2016-05-22 LAB — LIPID PANEL WITH LDL/HDL RATIO
Cholesterol, Total: 236 mg/dL — ABNORMAL HIGH (ref 100–199)
HDL: 53 mg/dL (ref >39)
LDL Calculated: 143 mg/dL — ABNORMAL HIGH (ref 0–99)
LDl/HDL Ratio: 2.7 ratio units (ref 0.0–3.2)
Triglycerides: 198 mg/dL — ABNORMAL HIGH (ref 0–149)
VLDL CHOLESTEROL CAL: 40 mg/dL (ref 5–40)

## 2016-05-22 LAB — VITAMIN D 25 HYDROXY (VIT D DEFICIENCY, FRACTURES): VIT D 25 HYDROXY: 53.6 ng/mL (ref 30.0–100.0)

## 2016-05-22 LAB — T4, FREE: Free T4: 1.23 ng/dL (ref 0.82–1.77)

## 2016-05-22 LAB — VITAMIN B12: VITAMIN B 12: 446 pg/mL (ref 232–1245)

## 2016-05-22 LAB — INSULIN, RANDOM: INSULIN: 13.5 u[IU]/mL (ref 2.6–24.9)

## 2016-05-22 LAB — T3: T3 TOTAL: 147 ng/dL (ref 71–180)

## 2016-05-22 LAB — TSH: TSH: 1.23 u[IU]/mL (ref 0.450–4.500)

## 2016-05-22 LAB — FOLATE: FOLATE: 10 ng/mL (ref >3.0)

## 2016-05-22 LAB — HEMOGLOBIN A1C
Est. average glucose Bld gHb Est-mCnc: 108 mg/dL
HEMOGLOBIN A1C: 5.4 % (ref 4.8–5.6)

## 2016-06-09 ENCOUNTER — Ambulatory Visit (INDEPENDENT_AMBULATORY_CARE_PROVIDER_SITE_OTHER): Payer: 59 | Admitting: Family Medicine

## 2016-06-09 ENCOUNTER — Encounter (INDEPENDENT_AMBULATORY_CARE_PROVIDER_SITE_OTHER): Payer: Self-pay

## 2016-06-10 ENCOUNTER — Encounter (INDEPENDENT_AMBULATORY_CARE_PROVIDER_SITE_OTHER): Payer: Self-pay

## 2016-06-11 ENCOUNTER — Encounter (INDEPENDENT_AMBULATORY_CARE_PROVIDER_SITE_OTHER): Payer: Self-pay

## 2016-06-11 ENCOUNTER — Ambulatory Visit (INDEPENDENT_AMBULATORY_CARE_PROVIDER_SITE_OTHER): Payer: 59 | Admitting: Family Medicine

## 2016-06-16 ENCOUNTER — Ambulatory Visit (INDEPENDENT_AMBULATORY_CARE_PROVIDER_SITE_OTHER): Payer: 59 | Admitting: Family Medicine

## 2016-06-16 VITALS — BP 118/74 | HR 81 | Temp 97.9°F | Ht 64.0 in | Wt 197.0 lb

## 2016-06-16 DIAGNOSIS — E785 Hyperlipidemia, unspecified: Secondary | ICD-10-CM | POA: Diagnosis not present

## 2016-06-16 DIAGNOSIS — Z9189 Other specified personal risk factors, not elsewhere classified: Secondary | ICD-10-CM | POA: Diagnosis not present

## 2016-06-16 DIAGNOSIS — Z6833 Body mass index (BMI) 33.0-33.9, adult: Secondary | ICD-10-CM

## 2016-06-16 DIAGNOSIS — E669 Obesity, unspecified: Secondary | ICD-10-CM | POA: Diagnosis not present

## 2016-06-16 DIAGNOSIS — E559 Vitamin D deficiency, unspecified: Secondary | ICD-10-CM | POA: Diagnosis not present

## 2016-06-16 DIAGNOSIS — E8881 Metabolic syndrome: Secondary | ICD-10-CM | POA: Diagnosis not present

## 2016-06-16 NOTE — Progress Notes (Signed)
Office: [phone]  /  Fax: [phone]   HPI:   Chief Complaint: OBESITY Connie Clarke is here to discuss her progress with her obesity treatment plan. She is following her eating plan approximately 85 % of the time and states she is exercising 40 minutes 5-6 times per week. Connie Clarke struggled with following the plan while travelling, deviated significantly from the plan, and was bored with plan. Connie Clarke is walking for exercise. Her weight is 197 lb (89.4 kg) today and has had a weight loss of 4 pounds over a period of 3-4 weeks since her last visit. She has lost 4 lbs since starting treatment with [name].  Insulin Resistance Connie Clarke has a  New diagnosis of insulin resistance based on her elevated fasting insulin level >5. Positive polyphagia in evenings, positive family history of diabetes. Although Connie Clarke's blood glucose readings are still under good control, insulin resistance puts her at greater risk of metabolic syndrome and diabetes. She is not taking metformin currently and continues to work on diet and exercise to decrease risk of diabetes.  At risk for diabetes Connie Clarke is at higher than average risk for developing diabetes due to her obesity. She currently denies polyuria or polydipsia.  Hyperlipidemia Connie Clarke has hyperlipidemia, elevated LDL and triglycerides and has been trying to improve her cholesterol levels with intensive lifestyle modification including a low saturated fat diet, exercise and weight loss. She denies any chest pain, claudication or myalgias. Connie Clarke states she doesn't want to be on Statins due to family history of diabetes.  Vitamin D deficiency Connie Clarke has a diagnosis of vitamin D deficiency. She is currently taking vit D and denies nausea, vomiting or muscle weakness.  Wt Readings from Last 500 Encounters:  06/16/16 197 lb (89.4 kg)  05/21/16 201 lb (91.2 kg)  06/04/09 193 lb (87.5 kg)  04/16/09 193 lb (87.5 kg)     ALLERGIES: Allergies not on  file  MEDICATIONS: Current Outpatient Prescriptions on File Prior to Visit  Medication Sig Dispense Refill   Cholecalciferol ([employer] VIT D 5000 HIGH-POTENCY) 5000 units capsule Take 5,000 Units by mouth daily.     estradiol (VIVELLE-DOT) 0.05 MG/24HR patch Place 0.05 patches onto the skin 2 (two) times a week.  11   lisinopril (PRINIVIL,ZESTRIL) 10 MG tablet Take 10 mg by mouth every morning.  3   progesterone (PROMETRIUM) 100 MG capsule Take 100 mg by mouth daily.     No current facility-administered medications on file prior to visit.     PAST MEDICAL HISTORY: Past Medical History:  Diagnosis Date   Depression    Hyperlipidemia    Hypertension    Infertility, female    Joint pain    Palpitations    Vitamin D deficiency     PAST SURGICAL HISTORY: Past Surgical History:  Procedure Laterality Date   NO PAST SURGERIES      SOCIAL HISTORY: Social History  Substance Use Topics   Smoking status: Former Smoker    Quit date: 05/21/2014   Smokeless tobacco: Never Used   Alcohol use Not on file    FAMILY HISTORY: Family History  Problem Relation Age of Onset   Stroke Mother    Anxiety disorder Mother    Obesity Mother    Diabetes Father    Hyperlipidemia Father    Stroke Father    Depression Father    Obesity Father     ROS: Review of Systems  Constitutional: Positive for weight loss.  Cardiovascular: Negative for chest pain and claudication.  Genitourinary: Negative for frequency.  Musculoskeletal: Negative for myalgias.  Endo/Heme/Allergies: Negative for polydipsia.       Positive Polyphagia    PHYSICAL EXAM: Blood pressure 118/74, pulse 81, temperature 97.9 F (36.6 C), temperature source Oral, height 5\' 4"  (1.626 m), weight 197 lb (89.4 kg), last menstrual period 06/02/2005, SpO2 99 %. Body mass index is 33.81 kg/m. Physical Exam  Constitutional: She is oriented to person, place, and time. She appears well-developed and  well-nourished.  Cardiovascular: Normal rate.   Pulmonary/Chest: Effort normal.  Musculoskeletal: Normal range of motion.  Neurological: She is oriented to person, place, and time.  Skin: Skin is warm and dry.  Psychiatric: She has a normal mood and affect. Her behavior is normal.  Vitals reviewed.   RECENT LABS AND TESTS: BMET    Component Value Date/Time   NA 140 05/21/2016 1504   K 4.6 05/21/2016 1504   CL 101 05/21/2016 1504   CO2 24 05/21/2016 1504   GLUCOSE 80 05/21/2016 1504   BUN 14 05/21/2016 1504   CREATININE 0.72 05/21/2016 1504   CALCIUM 9.6 05/21/2016 1504   GFRNONAA 91 05/21/2016 1504   GFRAA 105 05/21/2016 1504   Lab Results  Component Value Date   HGBA1C 5.4 05/21/2016   Lab Results  Component Value Date   INSULIN 13.5 05/21/2016   CBC    Component Value Date/Time   WBC 7.3 05/21/2016 1504   RBC 4.97 05/21/2016 1504   HCT 43.4 05/21/2016 1504   MCV 87 05/21/2016 1504   MCH 29.6 05/21/2016 1504   MCHC 33.9 05/21/2016 1504   RDW 13.1 05/21/2016 1504   LYMPHSABS 1.9 05/21/2016 1504   EOSABS 0.1 05/21/2016 1504   BASOSABS 0.0 05/21/2016 1504   Iron/TIBC/Ferritin/ %Sat No results found for: IRON, TIBC, FERRITIN, IRONPCTSAT Lipid Panel     Component Value Date/Time   CHOL 236 (H) 05/21/2016 1504   TRIG 198 (H) 05/21/2016 1504   HDL 53 05/21/2016 1504   LDLCALC 143 (H) 05/21/2016 1504   Hepatic Function Panel     Component Value Date/Time   PROT 6.8 05/21/2016 1504   ALBUMIN 4.3 05/21/2016 1504   AST 23 05/21/2016 1504   ALT 43 (H) 05/21/2016 1504   ALKPHOS 85 05/21/2016 1504   BILITOT 0.5 05/21/2016 1504      Component Value Date/Time   TSH 1.230 05/21/2016 1504    ASSESSMENT AND PLAN: Hyperlipidemia, unspecified hyperlipidemia type - Plan: omega-3 fish oil (MAXEPA) 1000 MG CAPS capsule  Insulin resistance  Vitamin D deficiency  At risk for diabetes mellitus  Class 1 obesity without serious comorbidity with body mass index  (BMI) of 33.0 to 33.9 in adult, unspecified obesity type  PLAN:  Insulin Resistance Connie Clarke will continue to work on weight loss, exercise, and decreasing simple carbohydrates in her diet to help decrease the risk of diabetes. We dicussed metformin including benefits and risks. She was informed that eating too many simple carbohydrates or too many calories at one sitting increases the likelihood of GI side effects. Kynda declined metformin for now and prescription was not written today. Talanda agreed to follow up with [name] as directed to monitor her progress. Will re-check labs in 3 months.  Diabetes risk counselling Zhavia was given extended (at least 30 minutes) diabetes prevention counseling today. She is 61 y.o. female and has risk factors for diabetes including obesity. We discussed intensive lifestyle modifications today with an emphasis on weight loss as well as increasing exercise and decreasing simple  carbohydrates in her diet.  Hyperlipidemia Leafy was informed of the American Heart Association Guidelines emphasize intensive lifestyle modifications as the first line treatment for hyperlipidemia. We discussed many lifestyle modifications today in depth, and Lizet will continue to work on decreasing saturated fats such as fatty red meat, butter and many fried foods. She will also increase vegetables and lean protein in her diet and continue to work on exercise and her weight loss efforts. Dayanne agrees to start to take OTC fish oil 3,000 MG daily. Will re-check labs in 3 months and follow.  Vitamin D Deficiency Janyia was informed that low vitamin D levels contributes to fatigue and are associated with obesity, breast, and colon cancer. She agrees to continue to take prescription Vit D @5 ,000 IU every day and will follow up for routine testing of vitamin D, at least 2-3 times per year. She was informed of the risk of over-replacement of vitamin D and agrees to not increase her dose unless he  discusses this with [name] first.  Obesity Janera is currently in the action stage of change. As such, her goal is to continue with weight loss efforts She has agreed to keep a food journal with 350-500 calories and 30+ grams protein daily at supper, sugar below 10 grams and follow the Category 2 plan Guneet has been instructed to work up to a goal of 150 minutes of combined cardio and strengthening exercise per week for weight loss and overall health benefits. We discussed the following Behavioral Modification Stratagies today: increasing lean protein intake, decreasing simple carbohydrates  and increasing lower sugar fruits  Taelyr has agreed to follow up with our clinic in 2 weeks. She was informed of the importance of frequent follow up visits to maximize her success with intensive lifestyle modifications for her multiple health conditions.  I, Doreene Nest, am acting as scribe for Dennard Nip, MD  I have reviewed the above documentation for accuracy and completeness, and I agree with the above. -Dennard Nip, MD

## 2016-06-17 MED ORDER — OMEGA-3 FISH OIL 1000 MG PO CAPS: 3.0000 | ORAL_CAPSULE | Freq: Every day | ORAL | Status: DC

## 2016-06-23 ENCOUNTER — Ambulatory Visit (INDEPENDENT_AMBULATORY_CARE_PROVIDER_SITE_OTHER): Payer: 59 | Admitting: Family Medicine

## 2016-06-30 ENCOUNTER — Ambulatory Visit (INDEPENDENT_AMBULATORY_CARE_PROVIDER_SITE_OTHER): Payer: 59 | Admitting: Family Medicine

## 2016-06-30 ENCOUNTER — Encounter (INDEPENDENT_AMBULATORY_CARE_PROVIDER_SITE_OTHER): Payer: Self-pay | Admitting: Family Medicine

## 2016-06-30 VITALS — BP 145/73 | HR 72 | Temp 98.1°F | Ht 64.0 in | Wt 199.0 lb

## 2016-06-30 DIAGNOSIS — E669 Obesity, unspecified: Secondary | ICD-10-CM | POA: Diagnosis not present

## 2016-06-30 DIAGNOSIS — E559 Vitamin D deficiency, unspecified: Secondary | ICD-10-CM

## 2016-06-30 DIAGNOSIS — R030 Elevated blood-pressure reading, without diagnosis of hypertension: Secondary | ICD-10-CM | POA: Diagnosis not present

## 2016-06-30 DIAGNOSIS — Z6834 Body mass index (BMI) 34.0-34.9, adult: Secondary | ICD-10-CM | POA: Diagnosis not present

## 2016-06-30 NOTE — Progress Notes (Signed)
Office: [phone]  /  Fax: [phone]   HPI:   Chief Complaint: OBESITY Connie Clarke is here to discuss her progress with her obesity treatment plan. She is following her eating plan approximately 80 % of the time and states she is walking 45 minutes 6 times per week. She has started journaling. Connie Clarke is currently struggling with retaining fluid today.  She had celebration eating over the weekend. Her weight is 199 lb (90.3 kg) today and she has had a weight gain of 2 lbs over the past 2 weeks since her last visit. She has lost 2 lbs since starting treatment with [name].  Vitamin D deficiency Connie Clarke has a diagnosis of vitamin D deficiency. She is currently taking vit D and denies nausea, vomiting or muscle weakness.  Elevated Blood Pressure Connie Clarke has had 2 elevated blood pressure readings in the past month, she thinks she may have white coat syndrome. She denies chest pain or headache.  Wt Readings from Last 500 Encounters:  06/30/16 199 lb (90.3 kg)  06/16/16 197 lb (89.4 kg)  05/21/16 201 lb (91.2 kg)  06/04/09 193 lb (87.5 kg)  04/16/09 193 lb (87.5 kg)     ALLERGIES: Not on File  MEDICATIONS: Current Outpatient Prescriptions on File Prior to Visit  Medication Sig Dispense Refill  . Cholecalciferol ([employer] VIT D 5000 HIGH-POTENCY) 5000 units capsule Take 5,000 Units by mouth daily.    [name] estradiol (VIVELLE-DOT) 0.05 MG/24HR patch Place 0.05 patches onto the skin 2 (two) times a week.  11  . lisinopril (PRINIVIL,ZESTRIL) 10 MG tablet Take 10 mg by mouth every morning.  3  . omega-3 fish oil (MAXEPA) 1000 MG CAPS capsule Take 3 capsules (3,000 mg total) by mouth daily. 90 each   . progesterone (PROMETRIUM) 100 MG capsule Take 100 mg by mouth daily.     No current facility-administered medications on file prior to visit.     PAST MEDICAL HISTORY: Past Medical History:  Diagnosis Date  . Depression   . Hyperlipidemia   . Hypertension   . Infertility, female   . Joint pain     . Palpitations   . Vitamin D deficiency     PAST SURGICAL HISTORY: Past Surgical History:  Procedure Laterality Date  . NO PAST SURGERIES      SOCIAL HISTORY: Social History  Substance Use Topics  . Smoking status: Former Smoker    Quit date: 05/21/2014  . Smokeless tobacco: Never Used  . Alcohol use Not on file    FAMILY HISTORY: Family History  Problem Relation Age of Onset  . Stroke Mother   . Anxiety disorder Mother   . Obesity Mother   . Diabetes Father   . Hyperlipidemia Father   . Stroke Father   . Depression Father   . Obesity Father     ROS: Review of Systems  Constitutional: Negative for weight loss.  Cardiovascular: Negative for chest pain.  Gastrointestinal: Negative for nausea and vomiting.  Musculoskeletal:       Negative muscle weakness  Neurological: Negative for headaches.    PHYSICAL EXAM: Blood pressure (!) 145/73, pulse 72, temperature 98.1 F (36.7 C), temperature source Oral, height 5\' 4"  (1.626 m), weight 199 lb (90.3 kg), last menstrual period 06/02/2005, SpO2 99 %. Body mass index is 34.16 kg/m. Physical Exam  Constitutional: She is oriented to person, place, and time. She appears well-developed and well-nourished.  Cardiovascular: Normal rate.   Pulmonary/Chest: Effort normal.  Musculoskeletal: Normal range of motion.  Neurological: She is oriented to person, place, and time.  Skin: Skin is warm and dry.  Psychiatric: She has a normal mood and affect. Her behavior is normal.  Vitals reviewed.   RECENT LABS AND TESTS: BMET    Component Value Date/Time   NA 140 05/21/2016 1504   K 4.6 05/21/2016 1504   CL 101 05/21/2016 1504   CO2 24 05/21/2016 1504   GLUCOSE 80 05/21/2016 1504   BUN 14 05/21/2016 1504   CREATININE 0.72 05/21/2016 1504   CALCIUM 9.6 05/21/2016 1504   GFRNONAA 91 05/21/2016 1504   GFRAA 105 05/21/2016 1504   Lab Results  Component Value Date   HGBA1C 5.4 05/21/2016   Lab Results  Component Value  Date   INSULIN 13.5 05/21/2016   CBC    Component Value Date/Time   WBC 7.3 05/21/2016 1504   RBC 4.97 05/21/2016 1504   HCT 43.4 05/21/2016 1504   MCV 87 05/21/2016 1504   MCH 29.6 05/21/2016 1504   MCHC 33.9 05/21/2016 1504   RDW 13.1 05/21/2016 1504   LYMPHSABS 1.9 05/21/2016 1504   EOSABS 0.1 05/21/2016 1504   BASOSABS 0.0 05/21/2016 1504   Iron/TIBC/Ferritin/ %Sat No results found for: IRON, TIBC, FERRITIN, IRONPCTSAT Lipid Panel     Component Value Date/Time   CHOL 236 (H) 05/21/2016 1504   TRIG 198 (H) 05/21/2016 1504   HDL 53 05/21/2016 1504   LDLCALC 143 (H) 05/21/2016 1504   Hepatic Function Panel     Component Value Date/Time   PROT 6.8 05/21/2016 1504   ALBUMIN 4.3 05/21/2016 1504   AST 23 05/21/2016 1504   ALT 43 (H) 05/21/2016 1504   ALKPHOS 85 05/21/2016 1504   BILITOT 0.5 05/21/2016 1504      Component Value Date/Time   TSH 1.230 05/21/2016 1504    ASSESSMENT AND PLAN: Vitamin D deficiency  Elevated blood pressure reading without diagnosis of hypertension  Class 1 obesity without serious comorbidity with body mass index (BMI) of 34.0 to 34.9 in adult, unspecified obesity type  PLAN:  Vitamin D Deficiency Connie Clarke was informed that low vitamin D levels contributes to fatigue and are associated with obesity, breast, and colon cancer. She agrees to continue to take OTC Vit D @5 ,000 IU every week and will plan to re-check labs in 6 weeks and will follow up for routine testing of vitamin D, at least 2-3 times per year. She was informed of the risk of over-replacement of vitamin D and agrees to not increase her dose unless he discusses this with [name] first.  Elevated Blood Pressure Connie Clarke agrees to check blood pressure at home and at work 3 to 4 times a week and bring in log to next visit.  We spent > than 50% of the 30 minute visit on the counseling as documented in the note.  Obesity Connie Clarke is currently in the action stage of change. As such, her  goal is to continue with weight loss efforts She has agreed to keep a food journal with 1100-1400 calories and 80+ grams of protein daily Connie Clarke has been instructed to work up to a goal of 150 minutes of combined cardio and strengthening exercise per week for weight loss and overall health benefits. We discussed the following Behavioral Modification Stratagies today: increasing lean protein intake, lower sugar fruits and increasing water.  Connie Clarke has agreed to follow up with our clinic in 2 weeks. She was informed of the importance of frequent follow up visits to maximize her  success with intensive lifestyle modifications for her multiple health conditions.  I, Doreene Nest, am acting as scribe for Dennard Nip, MD  I have reviewed the above documentation for accuracy and completeness, and I agree with the above. -Dennard Nip, MD

## 2016-07-16 ENCOUNTER — Ambulatory Visit (INDEPENDENT_AMBULATORY_CARE_PROVIDER_SITE_OTHER): Payer: 59 | Admitting: Family Medicine

## 2016-07-16 VITALS — BP 123/77 | HR 83 | Temp 98.3°F | Ht 64.0 in | Wt 193.0 lb

## 2016-07-16 DIAGNOSIS — E8881 Metabolic syndrome: Secondary | ICD-10-CM | POA: Diagnosis not present

## 2016-07-16 DIAGNOSIS — Z6833 Body mass index (BMI) 33.0-33.9, adult: Secondary | ICD-10-CM

## 2016-07-16 DIAGNOSIS — Z6832 Body mass index (BMI) 32.0-32.9, adult: Secondary | ICD-10-CM | POA: Insufficient documentation

## 2016-07-16 DIAGNOSIS — E669 Obesity, unspecified: Secondary | ICD-10-CM | POA: Diagnosis not present

## 2016-07-16 NOTE — Progress Notes (Signed)
Office: [phone]  /  Fax: [phone]   HPI:   Chief Complaint: OBESITY Connie Clarke is here to discuss her progress with her obesity treatment plan. She is following her eating plan approximately 80 to 90 % of the time and states she is walking 10,000 steps   7 times per week. Connie Clarke has done well with weight loss. She is getting tired of some of the food. She is currently struggling with planning meals ahead of time. She is journaling some and doing well with increased protein.Connie Clarke  Her weight is 193 lb (87.5 kg) today and has had a weight loss of 6 pounds over a period of 2 weeks since her last visit. She has lost 8 lbs since starting treatment with [name].  Insulin Resistance Connie Clarke has a diagnosis of insulin resistance based on her elevated fasting insulin level >5. Although Connie Clarke's blood glucose readings are still under good control, insulin resistance puts her at greater risk of metabolic syndrome and diabetes. She is not taking metformin currently, she is attempting to control without medications and is doing well so far. She had questions about ketosis and eating plans related to Atkins, etc. She continues to work on diet and exercise to decrease risk of diabetes. She is doing well working on decreasing simple carbohydrates in diet.  Wt Readings from Last 500 Encounters:  07/16/16 193 lb (87.5 kg)  06/30/16 199 lb (90.3 kg)  06/16/16 197 lb (89.4 kg)  05/21/16 201 lb (91.2 kg)  06/04/09 193 lb (87.5 kg)  04/16/09 193 lb (87.5 kg)     ALLERGIES: Not on File  MEDICATIONS: Current Outpatient Prescriptions on File Prior to Visit  Medication Sig Dispense Refill   Cholecalciferol ([employer]S VIT D 5000 HIGH-POTENCY) 5000 units capsule Take 5,000 Units by mouth daily.     estradiol (VIVELLE-DOT) 0.05 MG/24HR patch Place 0.05 patches onto the skin 2 (two) times a week.  11   lisinopril (PRINIVIL,ZESTRIL) 10 MG tablet Take 10 mg by mouth every morning.  3   omega-3 fish oil (MAXEPA) 1000 MG  CAPS capsule Take 3 capsules (3,000 mg total) by mouth daily. 90 each    progesterone (PROMETRIUM) 100 MG capsule Take 100 mg by mouth daily.     No current facility-administered medications on file prior to visit.     PAST MEDICAL HISTORY: Past Medical History:  Diagnosis Date   Depression    Hyperlipidemia    Hypertension    Infertility, female    Joint pain    Palpitations    Vitamin D deficiency     PAST SURGICAL HISTORY: Past Surgical History:  Procedure Laterality Date   NO PAST SURGERIES      SOCIAL HISTORY: Social History  Substance Use Topics   Smoking status: Former Smoker    Quit date: 05/21/2014   Smokeless tobacco: Never Used   Alcohol use Not on file    FAMILY HISTORY: Family History  Problem Relation Age of Onset   Stroke Mother    Anxiety disorder Mother    Obesity Mother    Diabetes Father    Hyperlipidemia Father    Stroke Father    Depression Father    Obesity Father     ROS: ROS  PHYSICAL EXAM: Blood pressure 123/77, pulse 83, temperature 98.3 F (36.8 C), temperature source Oral, height 5\' 4"  (1.626 m), weight 193 lb (87.5 kg), last menstrual period 06/02/2005, SpO2 99 %. Body mass index is 33.13 kg/m. Physical Exam  Constitutional: She is oriented  to person, place, and time. She appears well-developed and well-nourished.  Cardiovascular: Normal rate.   Pulmonary/Chest: Effort normal.  Musculoskeletal: Normal range of motion.  Neurological: She is oriented to person, place, and time.  Skin: Skin is warm and dry.  Psychiatric: She has a normal mood and affect. Her behavior is normal.  Vitals reviewed.   RECENT LABS AND TESTS: BMET    Component Value Date/Time   NA 140 05/21/2016 1504   K 4.6 05/21/2016 1504   CL 101 05/21/2016 1504   CO2 24 05/21/2016 1504   GLUCOSE 80 05/21/2016 1504   BUN 14 05/21/2016 1504   CREATININE 0.72 05/21/2016 1504   CALCIUM 9.6 05/21/2016 1504   GFRNONAA 91 05/21/2016 1504    GFRAA 105 05/21/2016 1504   Lab Results  Component Value Date   HGBA1C 5.4 05/21/2016   Lab Results  Component Value Date   INSULIN 13.5 05/21/2016   CBC    Component Value Date/Time   WBC 7.3 05/21/2016 1504   RBC 4.97 05/21/2016 1504   HCT 43.4 05/21/2016 1504   MCV 87 05/21/2016 1504   MCH 29.6 05/21/2016 1504   MCHC 33.9 05/21/2016 1504   RDW 13.1 05/21/2016 1504   LYMPHSABS 1.9 05/21/2016 1504   EOSABS 0.1 05/21/2016 1504   BASOSABS 0.0 05/21/2016 1504   Iron/TIBC/Ferritin/ %Sat No results found for: IRON, TIBC, FERRITIN, IRONPCTSAT Lipid Panel     Component Value Date/Time   CHOL 236 (H) 05/21/2016 1504   TRIG 198 (H) 05/21/2016 1504   HDL 53 05/21/2016 1504   LDLCALC 143 (H) 05/21/2016 1504   Hepatic Function Panel     Component Value Date/Time   PROT 6.8 05/21/2016 1504   ALBUMIN 4.3 05/21/2016 1504   AST 23 05/21/2016 1504   ALT 43 (H) 05/21/2016 1504   ALKPHOS 85 05/21/2016 1504   BILITOT 0.5 05/21/2016 1504      Component Value Date/Time   TSH 1.230 05/21/2016 1504    ASSESSMENT AND PLAN: Insulin resistance  Class 1 obesity without serious comorbidity with body mass index (BMI) of 33.0 to 33.9 in adult, unspecified obesity type  PLAN:  Insulin Resistance Connie Clarke will continue to work on weight loss, exercise, and decreasing simple carbohydrates in her diet to help decrease the risk of diabetes. We dicussed metformin including benefits and risks. We discussed insulin resistance and what the evidence says about the best way to eat for this condition. She was informed that eating too many simple carbohydrates or too many calories at one sitting increases the likelihood of GI side effects. [name] declined metformin for now and prescription was not written today. [name] agreed to follow up with [name] as directed to monitor her progress.  We spent > than 50% of the 15 minute visit on the counseling as documented in the note.  Obesity Connie Clarke is  currently in the action stage of change. As such, her goal is to continue with weight loss efforts She has agreed to keep a food journal with 1100 to 1400 calories and 80+ grams of protein daily Connie Clarke has been instructed to work up to a goal of 150 minutes of combined cardio and strengthening exercise per week for weight loss and overall health benefits. We discussed the following Behavioral Modification Stratagies today: decreasing simple carbohydrates , increasing lower sugar fruits, not skipping meals, ways to avoid night time snacking and avoiding temptations  Orba has agreed to follow up with our clinic in 2 weeks. She was informed  of the importance of frequent follow up visits to maximize her success with intensive lifestyle modifications for her multiple health conditions.  I, Doreene Nest, am acting as scribe for Dennard Nip, MD  I have reviewed the above documentation for accuracy and completeness, and I agree with the above. -Dennard Nip, MD

## 2016-08-04 ENCOUNTER — Ambulatory Visit (INDEPENDENT_AMBULATORY_CARE_PROVIDER_SITE_OTHER): Payer: 59 | Admitting: Family Medicine

## 2016-08-04 ENCOUNTER — Other Ambulatory Visit: Payer: Self-pay | Admitting: Family Medicine

## 2016-08-04 VITALS — BP 130/75 | Temp 97.8°F | Resp 16 | Ht 64.0 in | Wt 193.0 lb

## 2016-08-04 DIAGNOSIS — Z1231 Encounter for screening mammogram for malignant neoplasm of breast: Secondary | ICD-10-CM

## 2016-08-04 DIAGNOSIS — Z6833 Body mass index (BMI) 33.0-33.9, adult: Secondary | ICD-10-CM

## 2016-08-04 DIAGNOSIS — E8881 Metabolic syndrome: Secondary | ICD-10-CM

## 2016-08-04 DIAGNOSIS — Z9189 Other specified personal risk factors, not elsewhere classified: Secondary | ICD-10-CM | POA: Diagnosis not present

## 2016-08-04 DIAGNOSIS — R945 Abnormal results of liver function studies: Secondary | ICD-10-CM

## 2016-08-04 DIAGNOSIS — E669 Obesity, unspecified: Secondary | ICD-10-CM | POA: Diagnosis not present

## 2016-08-04 DIAGNOSIS — R7989 Other specified abnormal findings of blood chemistry: Secondary | ICD-10-CM | POA: Insufficient documentation

## 2016-08-04 MED ORDER — METFORMIN HCL 500 MG PO TABS: 500.0000 mg | ORAL_TABLET | Freq: Every day | ORAL | 0 refills | Status: DC

## 2016-08-04 MED FILL — ESTRADIOL 0.05 MG PATCH: 0.05 | 84 days supply | Qty: 24 | Fill #1

## 2016-08-04 MED FILL — PROGESTERONE 100 MG CAPSULE: 100 | 90 days supply | Qty: 90 | Fill #1

## 2016-08-04 MED FILL — metFORMIN HCL 500 MG TABS: 500 | 30 days supply | Qty: 30 | Fill #0

## 2016-08-04 NOTE — Progress Notes (Signed)
Office: [phone]  /  Fax: [phone]   HPI:   Chief Complaint: OBESITY Connie Clarke is here to discuss her progress with her obesity treatment plan. She is following her eating plan approximately 50 % of the time and states she is exercising 10,000 steps 5 to 6 times per week. Connie Clarke notes some celebration eating over the past weekend while traveling with family. She is trying to increase walking and worked on improving portion control. She hasn't journaled much over the last week and she is not eating enough protein. Her weight is 193 lb (87.5 kg) today and has maintained weight over the past 2 to 3 weeks since her last visit. She has lost 8 lbs since starting treatment with [name].  Insulin Resistance [name] has a diagnosis of insulin resistance based on her elevated fasting insulin level >5. Although Connie Clarke's blood glucose readings are still under good control, insulin resistance puts her at greater risk of metabolic syndrome and diabetes. She had questions about sugar and carbohydrates. She is working on decreasing simple carbohydrates with weight loss to help avoid diabetes. She is not taking metformin currently and continues to work on diet and exercise to help avoid diabetes.  At risk for diabetes Connie Clarke is at higher than average risk for developing diabetes due to her obesity. She currently denies polyuria or polydipsia.  Wt Readings from Last 500 Encounters:  08/04/16 193 lb (87.5 kg)  07/16/16 193 lb (87.5 kg)  06/30/16 199 lb (90.3 kg)  06/16/16 197 lb (89.4 kg)  05/21/16 201 lb (91.2 kg)  06/04/09 193 lb (87.5 kg)  04/16/09 193 lb (87.5 kg)     ALLERGIES: No Known Allergies  MEDICATIONS: Current Outpatient Prescriptions on File Prior to Visit  Medication Sig Dispense Refill  . Cholecalciferol ([employer] VIT D 5000 HIGH-POTENCY) 5000 units capsule Take 5,000 Units by mouth daily.    [name] estradiol (VIVELLE-DOT) 0.05 MG/24HR patch Place 0.05 patches onto the skin 2 (two) times a  week.  11  . lisinopril (PRINIVIL,ZESTRIL) 10 MG tablet Take 10 mg by mouth every morning.  3  . omega-3 fish oil (MAXEPA) 1000 MG CAPS capsule Take 3 capsules (3,000 mg total) by mouth daily. 90 each   . progesterone (PROMETRIUM) 100 MG capsule Take 100 mg by mouth daily.     No current facility-administered medications on file prior to visit.     PAST MEDICAL HISTORY: Past Medical History:  Diagnosis Date  . Depression   . Hyperlipidemia   . Hypertension   . Infertility, female   . Joint pain   . Palpitations   . Vitamin D deficiency     PAST SURGICAL HISTORY: Past Surgical History:  Procedure Laterality Date  . NO PAST SURGERIES      SOCIAL HISTORY: Social History  Substance Use Topics  . Smoking status: Former Smoker    Quit date: 05/21/2014  . Smokeless tobacco: Never Used  . Alcohol use Not on file    FAMILY HISTORY: Family History  Problem Relation Age of Onset  . Stroke Mother   . Anxiety disorder Mother   . Obesity Mother   . Diabetes Father   . Hyperlipidemia Father   . Stroke Father   . Depression Father   . Obesity Father     ROS: Review of Systems  Constitutional: Negative for weight loss.  Genitourinary: Negative for frequency.  Endo/Heme/Allergies: Negative for polydipsia.    PHYSICAL EXAM: Blood pressure 130/75, temperature 97.8 F (36.6 C), temperature source Oral,  resp. rate 16, height 5\' 4"  (1.626 m), weight 193 lb (87.5 kg), last menstrual period 06/02/2005, SpO2 99 %. Body mass index is 33.13 kg/m. Physical Exam  Constitutional: She is oriented to person, place, and time. She appears well-developed and well-nourished.  Cardiovascular: Normal rate.   Pulmonary/Chest: Effort normal.  Musculoskeletal: Normal range of motion.  Neurological: She is oriented to person, place, and time.  Skin: Skin is warm and dry.  Psychiatric: She has a normal mood and affect. Her behavior is normal.  Vitals reviewed.   RECENT LABS AND  TESTS: BMET    Component Value Date/Time   NA 140 05/21/2016 1504   K 4.6 05/21/2016 1504   CL 101 05/21/2016 1504   CO2 24 05/21/2016 1504   GLUCOSE 80 05/21/2016 1504   BUN 14 05/21/2016 1504   CREATININE 0.72 05/21/2016 1504   CALCIUM 9.6 05/21/2016 1504   GFRNONAA 91 05/21/2016 1504   GFRAA 105 05/21/2016 1504   Lab Results  Component Value Date   HGBA1C 5.4 05/21/2016   Lab Results  Component Value Date   INSULIN 13.5 05/21/2016   CBC    Component Value Date/Time   WBC 7.3 05/21/2016 1504   RBC 4.97 05/21/2016 1504   HCT 43.4 05/21/2016 1504   MCV 87 05/21/2016 1504   MCH 29.6 05/21/2016 1504   MCHC 33.9 05/21/2016 1504   RDW 13.1 05/21/2016 1504   LYMPHSABS 1.9 05/21/2016 1504   EOSABS 0.1 05/21/2016 1504   BASOSABS 0.0 05/21/2016 1504   Iron/TIBC/Ferritin/ %Sat No results found for: IRON, TIBC, FERRITIN, IRONPCTSAT Lipid Panel     Component Value Date/Time   CHOL 236 (H) 05/21/2016 1504   TRIG 198 (H) 05/21/2016 1504   HDL 53 05/21/2016 1504   LDLCALC 143 (H) 05/21/2016 1504   Hepatic Function Panel     Component Value Date/Time   PROT 6.8 05/21/2016 1504   ALBUMIN 4.3 05/21/2016 1504   AST 23 05/21/2016 1504   ALT 43 (H) 05/21/2016 1504   ALKPHOS 85 05/21/2016 1504   BILITOT 0.5 05/21/2016 1504      Component Value Date/Time   TSH 1.230 05/21/2016 1504    ASSESSMENT AND PLAN: Insulin resistance - Plan: metFORMIN (GLUCOPHAGE) 500 MG tablet  At risk for diabetes mellitus  Class 1 obesity without serious comorbidity with body mass index (BMI) of 33.0 to 33.9 in adult, unspecified obesity type  PLAN:  Insulin Resistance Connie Clarke will continue to work on weight loss, exercise, and decreasing simple carbohydrates in her diet to help decrease the risk of diabetes. We dicussed metformin including benefits and risks. She was informed that eating too many simple carbohydrates or too many calories at one sitting increases the likelihood of GI side  effects. Connie Clarke requested metformin for now and prescription was written today for Metformin 500 mg every morning #30 with no refills. Connie Clarke agreed to follow up with [name] as directed to monitor her progress.  Diabetes risk counselling Connie Clarke was given extended (at least 15 minutes) diabetes prevention counseling today. She is 61 y.o. female and has risk factors for diabetes including obesity. We discussed intensive lifestyle modifications today with an emphasis on weight loss as well as increasing exercise and decreasing simple carbohydrates in her diet.  Obesity Keyira is currently in the action stage of change. As such, her goal is to continue with weight loss efforts She has agreed to go back to  keeping a food journal with 1100 to 1400 calories and 80+ grams  of protein protein at daily Yeneisy has been instructed to work up to a goal of 150 minutes of combined cardio and strengthening exercise per week for weight loss and overall health benefits. We discussed the following Behavioral Modification Stratagies today: increasing lean protein intake, decreasing simple carbohydrates  and increasing lower sugar fruits  Suli has agreed to follow up with our clinic in 2 weeks. She was informed of the importance of frequent follow up visits to maximize her success with intensive lifestyle modifications for her multiple health conditions.  I, Doreene Nest, am acting as scribe for Dennard Nip, MD  I have reviewed the above documentation for accuracy and completeness, and I agree with the above. -Dennard Nip, MD

## 2016-08-20 ENCOUNTER — Ambulatory Visit (INDEPENDENT_AMBULATORY_CARE_PROVIDER_SITE_OTHER): Payer: 59 | Admitting: Family Medicine

## 2016-08-20 ENCOUNTER — Ambulatory Visit
Admission: RE | Admit: 2016-08-20 | Discharge: 2016-08-20 | Disposition: A | Discharge status: Home / Self Care | Payer: 59 | Source: Ambulatory Visit | Attending: Family Medicine | Admitting: Family Medicine

## 2016-08-20 VITALS — BP 131/83 | HR 82 | Temp 97.6°F | Ht 64.0 in | Wt 191.0 lb

## 2016-08-20 DIAGNOSIS — R945 Abnormal results of liver function studies: Secondary | ICD-10-CM

## 2016-08-20 DIAGNOSIS — E669 Obesity, unspecified: Secondary | ICD-10-CM

## 2016-08-20 DIAGNOSIS — R7989 Other specified abnormal findings of blood chemistry: Secondary | ICD-10-CM | POA: Diagnosis not present

## 2016-08-20 DIAGNOSIS — Z9189 Other specified personal risk factors, not elsewhere classified: Secondary | ICD-10-CM

## 2016-08-20 DIAGNOSIS — Z1231 Encounter for screening mammogram for malignant neoplasm of breast: Secondary | ICD-10-CM

## 2016-08-20 DIAGNOSIS — E8881 Metabolic syndrome: Secondary | ICD-10-CM

## 2016-08-20 DIAGNOSIS — E559 Vitamin D deficiency, unspecified: Secondary | ICD-10-CM | POA: Diagnosis not present

## 2016-08-20 DIAGNOSIS — E785 Hyperlipidemia, unspecified: Secondary | ICD-10-CM | POA: Diagnosis not present

## 2016-08-20 DIAGNOSIS — Z6832 Body mass index (BMI) 32.0-32.9, adult: Secondary | ICD-10-CM

## 2016-08-21 NOTE — Progress Notes (Signed)
Office: [phone]  /  Fax: [phone]   HPI:   Chief Complaint: OBESITY Connie Clarke is here to discuss her progress with her obesity treatment plan. She is following her eating plan approximately 75 % of the time and states she is walking 10,000 steps 6 times per week. Nour continues to do well with weight loss. She has struggled to journal regularly and still notes cravings especially. Her weight is 191 lb (86.6 kg) today and has had a weight loss of 2 pounds over a period of 2 weeks since her last visit. She has lost 10 lbs since starting treatment with [name].  Vitamin D deficiency Connie Clarke has a diagnosis of vitamin D deficiency. She is currently taking OTC vit D, not yet at goal. She denies nausea, vomiting or muscle weakness. Connie Clarke is due for labs.  Insulin Resistance Connie Clarke has a diagnosis of insulin resistance based on her elevated fasting insulin level >5. Although Connie Clarke's blood glucose readings are still under good control, insulin resistance puts her at greater risk of metabolic syndrome and diabetes. She started taking metformin and feels it isn't helping her and would like to stop taking it. She still notes polyphagia and is due for labs. Connie Clarke continues to work on diet and exercise to decrease risk of diabetes.  Elevated Liver Function Test Connie Clarke has elevated liver function test, likely due to Non Alcoholic Fatty Liver Disease. She is working on weight loss and is due for labs.  Hyperlipidemia Connie Clarke has hyperlipidemia and has been trying to improve her cholesterol levels with intensive lifestyle modification including a low saturated fat diet, exercise and weight loss. She denies any chest pain, claudication or myalgias.  Wt Readings from Last 500 Encounters:  08/20/16 191 lb (86.6 kg)  08/04/16 193 lb (87.5 kg)  07/16/16 193 lb (87.5 kg)  06/30/16 199 lb (90.3 kg)  06/16/16 197 lb (89.4 kg)  05/21/16 201 lb (91.2 kg)  06/04/09 193 lb (87.5 kg)  04/16/09 193 lb (87.5  kg)     ALLERGIES: No Known Allergies  MEDICATIONS: Current Outpatient Prescriptions on File Prior to Visit  Medication Sig Dispense Refill  . Cholecalciferol ([employer] VIT D 5000 HIGH-POTENCY) 5000 units capsule Take 5,000 Units by mouth daily.    [name] estradiol (VIVELLE-DOT) 0.05 MG/24HR patch Place 0.05 patches onto the skin 2 (two) times a week.  11  . lisinopril (PRINIVIL,ZESTRIL) 10 MG tablet Take 10 mg by mouth every morning.  3  . metFORMIN (GLUCOPHAGE) 500 MG tablet Take 1 tablet (500 mg total) by mouth daily with breakfast. 30 tablet 0  . omega-3 fish oil (MAXEPA) 1000 MG CAPS capsule Take 3 capsules (3,000 mg total) by mouth daily. 90 each   . progesterone (PROMETRIUM) 100 MG capsule Take 100 mg by mouth daily.     No current facility-administered medications on file prior to visit.     PAST MEDICAL HISTORY: Past Medical History:  Diagnosis Date  . Depression   . Hyperlipidemia   . Hypertension   . Infertility, female   . Joint pain   . Palpitations   . Vitamin D deficiency     PAST SURGICAL HISTORY: Past Surgical History:  Procedure Laterality Date  . NO PAST SURGERIES      SOCIAL HISTORY: Social History  Substance Use Topics  . Smoking status: Former Smoker    Quit date: 05/21/2014  . Smokeless tobacco: Never Used  . Alcohol use Not on file    FAMILY HISTORY: Family History  Problem  Relation Age of Onset  . Stroke Mother   . Anxiety disorder Mother   . Obesity Mother   . Diabetes Father   . Hyperlipidemia Father   . Stroke Father   . Depression Father   . Obesity Father     ROS: Review of Systems  Constitutional: Positive for weight loss.  Cardiovascular: Negative for chest pain and claudication.  Gastrointestinal: Negative for nausea and vomiting.  Musculoskeletal: Negative for myalgias.       Negative muscle weakness  Endo/Heme/Allergies:       Polyphagia    PHYSICAL EXAM: Blood pressure 131/83, pulse 82, temperature 97.6 F (36.4 C),  temperature source Oral, height 5\' 4"  (1.626 m), weight 191 lb (86.6 kg), last menstrual period 06/02/2005, SpO2 98 %. Body mass index is 32.79 kg/m. Physical Exam  Constitutional: She is oriented to person, place, and time. She appears well-developed and well-nourished.  Cardiovascular: Normal rate.   Pulmonary/Chest: Effort normal.  Musculoskeletal: Normal range of motion.  Neurological: She is oriented to person, place, and time.  Skin: Skin is warm and dry.  Psychiatric: She has a normal mood and affect. Her behavior is normal.  Vitals reviewed.   RECENT LABS AND TESTS: BMET    Component Value Date/Time   NA 140 05/21/2016 1504   K 4.6 05/21/2016 1504   CL 101 05/21/2016 1504   CO2 24 05/21/2016 1504   GLUCOSE 80 05/21/2016 1504   BUN 14 05/21/2016 1504   CREATININE 0.72 05/21/2016 1504   CALCIUM 9.6 05/21/2016 1504   GFRNONAA 91 05/21/2016 1504   GFRAA 105 05/21/2016 1504   Lab Results  Component Value Date   HGBA1C 5.4 05/21/2016   Lab Results  Component Value Date   INSULIN 13.5 05/21/2016   CBC    Component Value Date/Time   WBC 7.3 05/21/2016 1504   RBC 4.97 05/21/2016 1504   HCT 43.4 05/21/2016 1504   MCV 87 05/21/2016 1504   MCH 29.6 05/21/2016 1504   MCHC 33.9 05/21/2016 1504   RDW 13.1 05/21/2016 1504   LYMPHSABS 1.9 05/21/2016 1504   EOSABS 0.1 05/21/2016 1504   BASOSABS 0.0 05/21/2016 1504   Iron/TIBC/Ferritin/ %Sat No results found for: IRON, TIBC, FERRITIN, IRONPCTSAT Lipid Panel     Component Value Date/Time   CHOL 236 (H) 05/21/2016 1504   TRIG 198 (H) 05/21/2016 1504   HDL 53 05/21/2016 1504   LDLCALC 143 (H) 05/21/2016 1504   Hepatic Function Panel     Component Value Date/Time   PROT 6.8 05/21/2016 1504   ALBUMIN 4.3 05/21/2016 1504   AST 23 05/21/2016 1504   ALT 43 (H) 05/21/2016 1504   ALKPHOS 85 05/21/2016 1504   BILITOT 0.5 05/21/2016 1504      Component Value Date/Time   TSH 1.230 05/21/2016 1504    ASSESSMENT AND  PLAN: Insulin resistance - Plan: Hemoglobin A1c, Insulin, random  Vitamin D deficiency - Plan: Comprehensive metabolic panel, VITAMIN D 25 Hydroxy (Vit-D Deficiency, Fractures)  Elevated liver function tests - Plan: Comprehensive metabolic panel  Hyperlipidemia, unspecified hyperlipidemia type - Plan: Comprehensive metabolic panel, Lipid Panel With LDL/HDL Ratio  Class 1 obesity without serious comorbidity with body mass index (BMI) of 32.0 to 32.9 in adult, unspecified obesity type  PLAN:  Vitamin D Deficiency Connie Clarke was informed that low vitamin D levels contributes to fatigue and are associated with obesity, breast, and colon cancer. She agrees to continue to take OTC vitamin D and we will check labs and  will follow up for routine testing of vitamin D, at least 2-3 times per year. She was informed of the risk of on Vit ver-replacement of vitamin D and agrees to not increase her dose unless he discusses this with [name] first. Connie Clarke agrees to follow with our clinic in 2 weeks.  Insulin Resistance Connie Clarke will continue to work on weight loss, exercise, and decreasing simple carbohydrates in her diet to help decrease the risk of diabetes. We dicussed metformin including benefits and risks. She was informed that eating too many simple carbohydrates or too many calories at one sitting increases the likelihood of GI side effects. Nareh is ok to discontinue metformin for now and prescription was not written today. We will check labs and Connie Clarke agreed to follow up with [name] as directed to monitor her progress.  Elevated Liver Function Test We will check labs and follow  Hyperlipidemia Connie Clarke was informed of the American Heart Association Guidelines emphasizing intensive lifestyle modifications as the first line treatment for hyperlipidemia. We discussed many lifestyle modifications today in depth, and Connie Clarke will continue to work on decreasing saturated fats such as fatty red meat, butter and many fried  foods. She will also increase vegetables and lean protein in her diet and continue to work on exercise and weight loss efforts. We will check labs and follow.  Cardiovascular risk counselling Connie Clarke was given extended (at least 15 minutes) coronary artery disease prevention counseling today. She is 61 y.o. female and has risk factors for heart disease including obesity. We discussed intensive lifestyle modifications today with an emphasis on specific weight loss instructions and strategies. Pt was also informed of the importance of increasing exercise and decreasing saturated fats to help prevent heart disease.  Obesity Connie Clarke is currently in the action stage of change. As such, her goal is to continue with weight loss efforts She has agreed to keep a food journal with 1100 to 1400 calories and 80 grams of protein daily Connie Clarke has been instructed to work up to a goal of 150 minutes of combined cardio and strengthening exercise per week for weight loss and overall health benefits. We discussed the following Behavioral Modification Stratagies today: increasing lean protein intake, work on meal planning and easy cooking plans, dealing with family or coworker sabotage and emotional eating strategies  Connie Clarke has agreed to follow up with our clinic in 2 weeks. She was informed of the importance of frequent follow up visits to maximize her success with intensive lifestyle modifications for her multiple health conditions.  I, Doreene Nest, am acting as scribe for Dennard Nip, MD  I have reviewed the above documentation for accuracy and completeness, and I agree with the above. -Dennard Nip, MD

## 2016-08-27 DIAGNOSIS — R7989 Other specified abnormal findings of blood chemistry: Secondary | ICD-10-CM | POA: Diagnosis not present

## 2016-08-27 DIAGNOSIS — E559 Vitamin D deficiency, unspecified: Secondary | ICD-10-CM | POA: Diagnosis not present

## 2016-08-27 DIAGNOSIS — E785 Hyperlipidemia, unspecified: Secondary | ICD-10-CM | POA: Diagnosis not present

## 2016-08-27 DIAGNOSIS — E8881 Metabolic syndrome: Secondary | ICD-10-CM | POA: Diagnosis not present

## 2016-08-28 LAB — COMPREHENSIVE METABOLIC PANEL
A/G RATIO: 1.6 (ref 1.2–2.2)
ALT: 33 IU/L — AB (ref 0–32)
AST: 20 IU/L (ref 0–40)
Albumin: 4.2 g/dL (ref 3.6–4.8)
Alkaline Phosphatase: 81 IU/L (ref 39–117)
BUN/Creatinine Ratio: 31 — ABNORMAL HIGH (ref 12–28)
BUN: 24 mg/dL (ref 8–27)
Bilirubin Total: 0.5 mg/dL (ref 0.0–1.2)
CALCIUM: 9.8 mg/dL (ref 8.7–10.3)
CO2: 25 mmol/L (ref 18–29)
Chloride: 101 mmol/L (ref 96–106)
Creatinine, Ser: 0.78 mg/dL (ref 0.57–1.00)
GFR calc Af Amer: 96 mL/min/{1.73_m2} (ref >59)
GFR, EST NON AFRICAN AMERICAN: 83 mL/min/{1.73_m2} (ref >59)
Globulin, Total: 2.7 g/dL (ref 1.5–4.5)
Glucose: 89 mg/dL (ref 65–99)
POTASSIUM: 5.2 mmol/L (ref 3.5–5.2)
Sodium: 141 mmol/L (ref 134–144)
Total Protein: 6.9 g/dL (ref 6.0–8.5)

## 2016-08-28 LAB — HEMOGLOBIN A1C
ESTIMATED AVERAGE GLUCOSE: 105 mg/dL
Hgb A1c MFr Bld: 5.3 % (ref 4.8–5.6)

## 2016-08-28 LAB — LIPID PANEL WITH LDL/HDL RATIO
Cholesterol, Total: 237 mg/dL — ABNORMAL HIGH (ref 100–199)
HDL: 54 mg/dL (ref >39)
LDL Calculated: 160 mg/dL — ABNORMAL HIGH (ref 0–99)
LDl/HDL Ratio: 3 ratio units (ref 0.0–3.2)
Triglycerides: 114 mg/dL (ref 0–149)
VLDL Cholesterol Cal: 23 mg/dL (ref 5–40)

## 2016-08-28 LAB — INSULIN, RANDOM: INSULIN: 11.6 u[IU]/mL (ref 2.6–24.9)

## 2016-08-28 LAB — VITAMIN D 25 HYDROXY (VIT D DEFICIENCY, FRACTURES): Vit D, 25-Hydroxy: 60.9 ng/mL (ref 30.0–100.0)

## 2016-09-03 ENCOUNTER — Ambulatory Visit (INDEPENDENT_AMBULATORY_CARE_PROVIDER_SITE_OTHER): Payer: 59 | Admitting: Family Medicine

## 2016-09-03 VITALS — BP 143/80 | HR 69 | Temp 97.9°F | Ht 64.0 in | Wt 191.0 lb

## 2016-09-03 DIAGNOSIS — E669 Obesity, unspecified: Secondary | ICD-10-CM

## 2016-09-03 DIAGNOSIS — Z6832 Body mass index (BMI) 32.0-32.9, adult: Secondary | ICD-10-CM

## 2016-09-03 DIAGNOSIS — E8881 Metabolic syndrome: Secondary | ICD-10-CM

## 2016-09-03 NOTE — Progress Notes (Signed)
Office: [phone]  /  Fax: [phone]   HPI:   Chief Complaint: OBESITY Connie Clarke is here to discuss her progress with her obesity treatment plan. Connie Clarke is following her eating plan approximately 50 % of the time and states Connie Clarke is exercising 45 minutes 6 times per week. Connie Clarke has done well with maintaining weight with increased celebration eating but is ready to get back on track. Connie Clarke is doing better increasing exercise. Her weight is 191 lb (86.6 kg) today and has maintained her weight over a period of 2 weeks since her last visit. Connie Clarke has lost 10 lbs since starting treatment with Connie Clarke.  Insulin Resistance Connie Clarke has a diagnosis of insulin resistance based on her elevated fasting insulin level >5. Although Connie Clarke's blood glucose readings are still under good control, insulin resistance puts her at greater risk of metabolic syndrome and diabetes. Connie Clarke is improving with diet and Connie Clarke is no longer on Metformin. Connie Clarke continues to work on diet and exercise to decrease risk of diabetes. Connie Clarke denies any hypoglycemic episodes.  Wt Readings from Last 500 Encounters:  09/03/16 191 lb (86.6 kg)  08/20/16 191 lb (86.6 kg)  08/04/16 193 lb (87.5 kg)  07/16/16 193 lb (87.5 kg)  06/30/16 199 lb (90.3 kg)  06/16/16 197 lb (89.4 kg)  05/21/16 201 lb (91.2 kg)  06/04/09 193 lb (87.5 kg)  04/16/09 193 lb (87.5 kg)     ALLERGIES: No Known Allergies  MEDICATIONS: Current Outpatient Prescriptions on File Prior to Visit  Medication Sig Dispense Refill  . Cholecalciferol ([employer] VIT D 5000 HIGH-POTENCY) 5000 units capsule Take 5,000 Units by mouth daily.    [name] estradiol (VIVELLE-DOT) 0.05 MG/24HR patch Place 0.05 patches onto the skin 2 (two) times a week.  11  . lisinopril (PRINIVIL,ZESTRIL) 10 MG tablet Take 10 mg by mouth every morning.  3  . progesterone (PROMETRIUM) 100 MG capsule Take 100 mg by mouth daily.    [name] omega-3 fish oil (MAXEPA) 1000 MG CAPS capsule Take 3 capsules (3,000 mg total) by mouth  daily. (Patient not taking: Reported on 09/03/2016) 90 each    No current facility-administered medications on file prior to visit.     PAST MEDICAL HISTORY: Past Medical History:  Diagnosis Date  . Depression   . Hyperlipidemia   . Hypertension   . Infertility, female   . Joint pain   . Palpitations   . Vitamin D deficiency     PAST SURGICAL HISTORY: Past Surgical History:  Procedure Laterality Date  . NO PAST SURGERIES      SOCIAL HISTORY: Social History  Substance Use Topics  . Smoking status: Former Smoker    Quit date: 05/21/2014  . Smokeless tobacco: Never Used  . Alcohol use Not on file    FAMILY HISTORY: Family History  Problem Relation Age of Onset  . Stroke Mother   . Anxiety disorder Mother   . Obesity Mother   . Diabetes Father   . Hyperlipidemia Father   . Stroke Father   . Depression Father   . Obesity Father     ROS: Review of Systems  Constitutional: Negative for weight loss.  Endo/Heme/Allergies:       Negative hypoglycemia    PHYSICAL EXAM: Blood pressure (!) 143/80, pulse 69, temperature 97.9 F (36.6 C), temperature source Oral, height 5\' 4"  (1.626 m), weight 191 lb (86.6 kg), last menstrual period 06/02/2005, SpO2 99 %. Body mass index is 32.79 kg/m. Physical Exam  Constitutional: Connie Clarke is oriented  to person, place, and time. Connie Clarke appears well-developed and well-nourished.  Cardiovascular: Normal rate.   Pulmonary/Chest: Effort normal.  Musculoskeletal: Normal range of motion.  Neurological: Connie Clarke is oriented to person, place, and time.  Skin: Skin is warm and dry.  Psychiatric: Connie Clarke has a normal mood and affect. Her behavior is normal.  Vitals reviewed.   RECENT LABS AND TESTS: BMET    Component Value Date/Time   NA 141 08/27/2016 0958   K 5.2 08/27/2016 0958   CL 101 08/27/2016 0958   CO2 25 08/27/2016 0958   GLUCOSE 89 08/27/2016 0958   BUN 24 08/27/2016 0958   CREATININE 0.78 08/27/2016 0958   CALCIUM 9.8 08/27/2016 0958     GFRNONAA 83 08/27/2016 0958   GFRAA 96 08/27/2016 0958   Lab Results  Component Value Date   HGBA1C 5.3 08/27/2016   HGBA1C 5.4 05/21/2016   Lab Results  Component Value Date   INSULIN 11.6 08/27/2016   INSULIN 13.5 05/21/2016   CBC    Component Value Date/Time   WBC 7.3 05/21/2016 1504   RBC 4.97 05/21/2016 1504   HCT 43.4 05/21/2016 1504   MCV 87 05/21/2016 1504   MCH 29.6 05/21/2016 1504   MCHC 33.9 05/21/2016 1504   RDW 13.1 05/21/2016 1504   LYMPHSABS 1.9 05/21/2016 1504   EOSABS 0.1 05/21/2016 1504   BASOSABS 0.0 05/21/2016 1504   Iron/TIBC/Ferritin/ %Sat No results found for: IRON, TIBC, FERRITIN, IRONPCTSAT Lipid Panel     Component Value Date/Time   CHOL 237 (H) 08/27/2016 0958   TRIG 114 08/27/2016 0958   HDL 54 08/27/2016 0958   LDLCALC 160 (H) 08/27/2016 0958   Hepatic Function Panel     Component Value Date/Time   PROT 6.9 08/27/2016 0958   ALBUMIN 4.2 08/27/2016 0958   AST 20 08/27/2016 0958   ALT 33 (H) 08/27/2016 0958   ALKPHOS 81 08/27/2016 0958   BILITOT 0.5 08/27/2016 0958      Component Value Date/Time   TSH 1.230 05/21/2016 1504    ASSESSMENT AND PLAN: Insulin resistance  Class 1 obesity without serious comorbidity with body mass index (BMI) of 32.0 to 32.9 in adult, unspecified obesity type  PLAN:  Insulin Resistance Connie Clarke will continue to work on weight loss, exercise, and decreasing simple carbohydrates in her diet to help decrease the risk of diabetes. Connie Clarke was informed that eating too many simple carbohydrates or too many calories at one sitting increases the likelihood of GI side effects. Connie Clarke agreed to follow up with Connie Clarke as directed to monitor her progress.  We spent > than 50% of the 30 minute visit on the counseling as documented in the note.  Obesity Connie Clarke is currently in the action stage of change. As such, her goal is to continue with weight loss efforts Connie Clarke has agreed to follow the Category 2 plan Connie Clarke has  been instructed to work up to a goal of 150 minutes of combined cardio and strengthening exercise per week for weight loss and overall health benefits. We discussed the following Behavioral Modification Stratagies today: increasing lean protein intake, increasing lower sugar fruits and work on meal planning and easy cooking plans  Connie Clarke has agreed to follow up with our clinic in 2 to 3 weeks. Connie Clarke was informed of the importance of frequent follow up visits to maximize her success with intensive lifestyle modifications for her multiple health conditions.  Corey Skains, am acting as scribe for Dennard Nip, MD  I have reviewed  the above documentation for accuracy and completeness, and I agree with the above. -Dennard Nip, MD

## 2016-09-25 MED FILL — LISINOPRIL 10 MG TABLET: 10 | 90 days supply | Qty: 90 | Fill #0

## 2016-09-29 DIAGNOSIS — E559 Vitamin D deficiency, unspecified: Secondary | ICD-10-CM | POA: Diagnosis not present

## 2016-09-29 DIAGNOSIS — Z1159 Encounter for screening for other viral diseases: Secondary | ICD-10-CM | POA: Diagnosis not present

## 2016-09-29 DIAGNOSIS — E669 Obesity, unspecified: Secondary | ICD-10-CM | POA: Diagnosis not present

## 2016-09-29 DIAGNOSIS — I10 Essential (primary) hypertension: Secondary | ICD-10-CM | POA: Diagnosis not present

## 2016-09-29 DIAGNOSIS — E7800 Pure hypercholesterolemia, unspecified: Secondary | ICD-10-CM | POA: Diagnosis not present

## 2016-09-29 DIAGNOSIS — F329 Major depressive disorder, single episode, unspecified: Secondary | ICD-10-CM | POA: Diagnosis not present

## 2016-09-29 DIAGNOSIS — Z0000 Encounter for general adult medical examination without abnormal findings: Secondary | ICD-10-CM | POA: Diagnosis not present

## 2016-09-29 LAB — HM HEPATITIS C SCREENING LAB: HM HEPATITIS C SCREENING: NEGATIVE

## 2016-10-01 ENCOUNTER — Ambulatory Visit (INDEPENDENT_AMBULATORY_CARE_PROVIDER_SITE_OTHER): Payer: 59 | Admitting: Family Medicine

## 2016-10-01 VITALS — BP 108/71 | HR 85 | Temp 97.4°F | Ht 64.0 in | Wt 192.0 lb

## 2016-10-01 DIAGNOSIS — F3289 Other specified depressive episodes: Secondary | ICD-10-CM

## 2016-10-01 DIAGNOSIS — E669 Obesity, unspecified: Secondary | ICD-10-CM | POA: Diagnosis not present

## 2016-10-01 DIAGNOSIS — Z6833 Body mass index (BMI) 33.0-33.9, adult: Secondary | ICD-10-CM

## 2016-10-01 MED ORDER — BUPROPION HCL ER (SR) 150 MG PO TB12: 150.0000 mg | ORAL_TABLET | Freq: Every day | ORAL | 0 refills | Status: DC

## 2016-10-01 MED FILL — AMOXICILLIN 875 MG TABLET: 875 | 10 days supply | Qty: 20 | Fill #0

## 2016-10-01 NOTE — Progress Notes (Signed)
Office: [phone]  /  Fax: [phone]   HPI:   Chief Complaint: OBESITY Connie Clarke is here to discuss her progress with her obesity treatment plan. She is on the  follow the Category 2 plan and is following her eating plan approximately 75 % of the time. She states she is walking 10,000 steps a day for 30 to 45 minutes minutes 5 times per week. Connie Clarke is struggling to follow th e plan closely, planning ahead is still a bit of a challenge. She struggles with some comfort eating. Her weight is 192 lb (87.1 kg) today and has had a weight gain of 1 lb over a period of 4 weeks since her last visit. She has lost 9 lbs since starting treatment with [name].  Depression with emotional eating behaviors Connie Clarke is struggling with emotional eating and using food for comfort to the extent that it is negatively impacting her health. She often snacks when she is not hungry. Connie Clarke sometimes feels she is out of control and then feels guilty that she made poor food choices. She notes increased emotional eating and feels she makes less ideal food choices when she is stressed. She has been working on behavior modification techniques to help reduce her emotional eating and has been somewhat successful. She shows no sign of suicidal or homicidal ideations.  Depression screen PHQ 2/9 05/21/2016  Decreased Interest 2  Down, Depressed, Hopeless 3  PHQ - 2 Score 5  Altered sleeping 1  Tired, decreased energy 2  Change in appetite 3  Feeling bad or failure about yourself  3  Trouble concentrating 1  Moving slowly or fidgety/restless 1  Suicidal thoughts 0  PHQ-9 Score 16     Wt Readings from Last 500 Encounters:  10/01/16 192 lb (87.1 kg)  09/03/16 191 lb (86.6 kg)  08/20/16 191 lb (86.6 kg)  08/04/16 193 lb (87.5 kg)  07/16/16 193 lb (87.5 kg)  06/30/16 199 lb (90.3 kg)  06/16/16 197 lb (89.4 kg)  05/21/16 201 lb (91.2 kg)  06/04/09 193 lb (87.5 kg)  04/16/09 193 lb (87.5 kg)     ALLERGIES: No  Known Allergies  MEDICATIONS: Current Outpatient Prescriptions on File Prior to Visit  Medication Sig Dispense Refill  . Cholecalciferol ([employer] VIT D 5000 HIGH-POTENCY) 5000 units capsule Take 5,000 Units by mouth daily.    [name] estradiol (VIVELLE-DOT) 0.05 MG/24HR patch Place 0.05 patches onto the skin 2 (two) times a week.  11  . lisinopril (PRINIVIL,ZESTRIL) 10 MG tablet Take 10 mg by mouth every morning.  3  . omega-3 fish oil (MAXEPA) 1000 MG CAPS capsule Take 3 capsules (3,000 mg total) by mouth daily. 90 each   . progesterone (PROMETRIUM) 100 MG capsule Take 100 mg by mouth daily.     No current facility-administered medications on file prior to visit.     PAST MEDICAL HISTORY: Past Medical History:  Diagnosis Date  . Depression   . Hyperlipidemia   . Hypertension   . Infertility, female   . Joint pain   . Palpitations   . Vitamin D deficiency     PAST SURGICAL HISTORY: Past Surgical History:  Procedure Laterality Date  . NO PAST SURGERIES      SOCIAL HISTORY: Social History  Substance Use Topics  . Smoking status: Former Smoker    Quit date: 05/21/2014  . Smokeless tobacco: Never Used  . Alcohol use Not on file    FAMILY HISTORY: Family History  Problem Relation Age of  Onset  . Stroke Mother   . Anxiety disorder Mother   . Obesity Mother   . Diabetes Father   . Hyperlipidemia Father   . Stroke Father   . Depression Father   . Obesity Father     ROS: Review of Systems  Constitutional: Negative for weight loss.  Psychiatric/Behavioral: Positive for depression. Negative for suicidal ideas.       Stress     PHYSICAL EXAM: Blood pressure 108/71, pulse 85, temperature 97.4 F (36.3 C), temperature source Oral, height 5\' 4"  (1.626 m), weight 192 lb (87.1 kg), last menstrual period 06/02/2005, SpO2 99 %. Body mass index is 32.96 kg/m. Physical Exam  Constitutional: She is oriented to person, place, and time. She appears well-developed and well-nourished.   Cardiovascular: Normal rate.   Pulmonary/Chest: Effort normal.  Musculoskeletal: Normal range of motion.  Neurological: She is oriented to person, place, and time.  Skin: Skin is warm and dry.  Vitals reviewed.   RECENT LABS AND TESTS: BMET    Component Value Date/Time   NA 141 08/27/2016 0958   K 5.2 08/27/2016 0958   CL 101 08/27/2016 0958   CO2 25 08/27/2016 0958   GLUCOSE 89 08/27/2016 0958   BUN 24 08/27/2016 0958   CREATININE 0.78 08/27/2016 0958   CALCIUM 9.8 08/27/2016 0958   GFRNONAA 83 08/27/2016 0958   GFRAA 96 08/27/2016 0958   Lab Results  Component Value Date   HGBA1C 5.3 08/27/2016   HGBA1C 5.4 05/21/2016   Lab Results  Component Value Date   INSULIN 11.6 08/27/2016   INSULIN 13.5 05/21/2016   CBC    Component Value Date/Time   WBC 7.3 05/21/2016 1504   RBC 4.97 05/21/2016 1504   HCT 43.4 05/21/2016 1504   MCV 87 05/21/2016 1504   MCH 29.6 05/21/2016 1504   MCHC 33.9 05/21/2016 1504   RDW 13.1 05/21/2016 1504   LYMPHSABS 1.9 05/21/2016 1504   EOSABS 0.1 05/21/2016 1504   BASOSABS 0.0 05/21/2016 1504   Iron/TIBC/Ferritin/ %Sat No results found for: IRON, TIBC, FERRITIN, IRONPCTSAT Lipid Panel     Component Value Date/Time   CHOL 237 (H) 08/27/2016 0958   TRIG 114 08/27/2016 0958   HDL 54 08/27/2016 0958   LDLCALC 160 (H) 08/27/2016 0958   Hepatic Function Panel     Component Value Date/Time   PROT 6.9 08/27/2016 0958   ALBUMIN 4.2 08/27/2016 0958   AST 20 08/27/2016 0958   ALT 33 (H) 08/27/2016 0958   ALKPHOS 81 08/27/2016 0958   BILITOT 0.5 08/27/2016 0958      Component Value Date/Time   TSH 1.230 05/21/2016 1504    ASSESSMENT AND PLAN: Other depression - Plan: buPROPion (WELLBUTRIN SR) 150 MG 12 hr tablet  Class 1 obesity without serious comorbidity with body mass index (BMI) of 33.0 to 33.9 in adult, unspecified obesity type  PLAN:  Depression with Emotional Eating Behaviors We discussed behavior modification  techniques today to help Connie Clarke deal with her emotional eating and depression. She has agreed to start to  take Wellbutrin SR 150 mg every morning #30 with no refills and she agreed to follow up as directed.  We spent > than 50% of the 30 minute visit on the counseling as documented in the note.  Obesity Connie Clarke is currently in the action stage of change. As such, her goal is to continue with weight loss efforts She has agreed to follow the Category 2 plan Connie Clarke has been instructed to work  up to a goal of 150 minutes of combined cardio and strengthening exercise per week for weight loss and overall health benefits. We discussed the following Behavioral Modification Stratagies today: increasing lean protein intake and work on meal planning.  Connie Clarke has agreed to follow up with our clinic in 2 weeks. She was informed of the importance of frequent follow up visits to maximize her success with intensive lifestyle modifications for her multiple health conditions.  I, Doreene Nest, am acting as scribe for Dennard Nip, MD  I have reviewed the above documentation for accuracy and completeness, and I agree with the above. -Dennard Nip, MD

## 2016-10-02 MED FILL — BUPROPION SR 150 MG TABLET: 150 | 30 days supply | Qty: 30 | Fill #0

## 2016-10-15 ENCOUNTER — Ambulatory Visit (INDEPENDENT_AMBULATORY_CARE_PROVIDER_SITE_OTHER): Payer: 59 | Admitting: Family Medicine

## 2016-10-22 ENCOUNTER — Ambulatory Visit (INDEPENDENT_AMBULATORY_CARE_PROVIDER_SITE_OTHER): Payer: 59 | Admitting: Family Medicine

## 2016-10-22 VITALS — BP 118/76 | HR 87 | Temp 97.6°F | Ht 64.0 in | Wt 190.0 lb

## 2016-10-22 DIAGNOSIS — E669 Obesity, unspecified: Secondary | ICD-10-CM | POA: Diagnosis not present

## 2016-10-22 DIAGNOSIS — F3289 Other specified depressive episodes: Secondary | ICD-10-CM | POA: Diagnosis not present

## 2016-10-22 DIAGNOSIS — Z6832 Body mass index (BMI) 32.0-32.9, adult: Secondary | ICD-10-CM

## 2016-10-22 MED ORDER — BUPROPION HCL 100 MG PO TABS: 100.0000 mg | ORAL_TABLET | Freq: Every day | ORAL | 0 refills | Status: DC

## 2016-10-22 MED FILL — buPROPion HCL 100 MG TABS: 100 | 30 days supply | Qty: 30 | Fill #0

## 2016-10-22 NOTE — Progress Notes (Signed)
Office: [phone]  /  Fax: [phone]   HPI:   Chief Complaint: OBESITY Connie Clarke is here to discuss her progress with her obesity treatment plan. She is on the  follow the Category 2 plan and is following her eating plan approximately 60 % of the time. She states she is exercising 60 minutes 6 times per week. Connie Clarke continues to do well with weight loss.  She states she is getting bored with the meal plan and would like more variety.  Her weight is 190 lb (86.2 kg) today and has had a weight loss of 2 pounds over a period of 3 weeks since her last visit. She has lost 11 lbs since starting treatment with [name].  Depression with emotional eating behaviors Connie Clarke states Wellbutrin 150 mg slow release was causing her to wake up more frequently at night, she wants to try the immediate release Wellbutrin. She is struggling with emotional eating and using food for comfort to the extent that it is negatively impacting her health. She often snacks when she is not hungry. Connie Clarke sometimes feels she is out of control and then feels guilty that she made poor food choices. She has been working on behavior modification techniques to help reduce her emotional eating and has been somewhat successful. She shows no sign of suicidal or homicidal ideations.  Depression screen PHQ 2/9 05/21/2016  Decreased Interest 2  Down, Depressed, Hopeless 3  PHQ - 2 Score 5  Altered sleeping 1  Tired, decreased energy 2  Change in appetite 3  Feeling bad or failure about yourself  3  Trouble concentrating 1  Moving slowly or fidgety/restless 1  Suicidal thoughts 0  PHQ-9 Score 16     ALLERGIES: No Known Allergies  MEDICATIONS: Current Outpatient Prescriptions on File Prior to Visit  Medication Sig Dispense Refill  . Cholecalciferol ([employer] VIT D 5000 HIGH-POTENCY) 5000 units capsule Take 5,000 Units by mouth daily.    [name] estradiol (VIVELLE-DOT) 0.05 MG/24HR patch Place 0.05 patches onto the skin 2 (two) times a  week.  11  . lisinopril (PRINIVIL,ZESTRIL) 10 MG tablet Take 10 mg by mouth every morning.  3  . Multiple Vitamins-Minerals (MULTIVITAMIN WITH MINERALS) tablet Take 1 tablet by mouth daily.    [name] omega-3 fish oil (MAXEPA) 1000 MG CAPS capsule Take 3 capsules (3,000 mg total) by mouth daily. 90 each   . progesterone (PROMETRIUM) 100 MG capsule Take 100 mg by mouth daily.    . [DISCONTINUED] buPROPion (WELLBUTRIN SR) 150 MG 12 hr tablet Take 1 tablet (150 mg total) by mouth daily. 30 tablet 0   No current facility-administered medications on file prior to visit.     PAST MEDICAL HISTORY: Past Medical History:  Diagnosis Date  . Depression   . Hyperlipidemia   . Hypertension   . Infertility, female   . Joint pain   . Palpitations   . Vitamin D deficiency     PAST SURGICAL HISTORY: Past Surgical History:  Procedure Laterality Date  . NO PAST SURGERIES      SOCIAL HISTORY: Social History  Substance Use Topics  . Smoking status: Former Smoker    Quit date: 05/21/2014  . Smokeless tobacco: Never Used  . Alcohol use Not on file    FAMILY HISTORY: Family History  Problem Relation Age of Onset  . Stroke Mother   . Anxiety disorder Mother   . Obesity Mother   . Diabetes Father   . Hyperlipidemia Father   . Stroke  Father   . Depression Father   . Obesity Father     ROS: Review of Systems  Constitutional: Positive for weight loss.  Psychiatric/Behavioral: Negative for suicidal ideas.       Negative homicidal    PHYSICAL EXAM: Blood pressure 118/76, pulse 87, temperature 97.6 F (36.4 C), temperature source Oral, height 5\' 4"  (1.626 m), weight 190 lb (86.2 kg), last menstrual period 06/02/2005, SpO2 98 %. Body mass index is 32.61 kg/m. Physical Exam  Constitutional: She is oriented to person, place, and time. She appears well-developed and well-nourished.  Cardiovascular: Normal rate.   Pulmonary/Chest: Effort normal.  Neurological: She is alert and oriented to  person, place, and time.  Skin: Skin is warm and dry.  Psychiatric: She has a normal mood and affect.    RECENT LABS AND TESTS: BMET    Component Value Date/Time   NA 141 08/27/2016 0958   K 5.2 08/27/2016 0958   CL 101 08/27/2016 0958   CO2 25 08/27/2016 0958   GLUCOSE 89 08/27/2016 0958   BUN 24 08/27/2016 0958   CREATININE 0.78 08/27/2016 0958   CALCIUM 9.8 08/27/2016 0958   GFRNONAA 83 08/27/2016 0958   GFRAA 96 08/27/2016 0958   Lab Results  Component Value Date   HGBA1C 5.3 08/27/2016   HGBA1C 5.4 05/21/2016   Lab Results  Component Value Date   INSULIN 11.6 08/27/2016   INSULIN 13.5 05/21/2016   CBC    Component Value Date/Time   WBC 7.3 05/21/2016 1504   RBC 4.97 05/21/2016 1504   HCT 43.4 05/21/2016 1504   MCV 87 05/21/2016 1504   MCH 29.6 05/21/2016 1504   MCHC 33.9 05/21/2016 1504   RDW 13.1 05/21/2016 1504   LYMPHSABS 1.9 05/21/2016 1504   EOSABS 0.1 05/21/2016 1504   BASOSABS 0.0 05/21/2016 1504   Iron/TIBC/Ferritin/ %Sat No results found for: IRON, TIBC, FERRITIN, IRONPCTSAT Lipid Panel     Component Value Date/Time   CHOL 237 (H) 08/27/2016 0958   TRIG 114 08/27/2016 0958   HDL 54 08/27/2016 0958   LDLCALC 160 (H) 08/27/2016 0958   Hepatic Function Panel     Component Value Date/Time   PROT 6.9 08/27/2016 0958   ALBUMIN 4.2 08/27/2016 0958   AST 20 08/27/2016 0958   ALT 33 (H) 08/27/2016 0958   ALKPHOS 81 08/27/2016 0958   BILITOT 0.5 08/27/2016 0958      Component Value Date/Time   TSH 1.230 05/21/2016 1504    ASSESSMENT AND PLAN: Other depression - Plan: buPROPion (WELLBUTRIN) 100 MG tablet  Class 1 obesity without serious comorbidity with body mass index (BMI) of 32.0 to 32.9 in adult, unspecified obesity type  PLAN: Depression with Emotional Eating Behaviors We discussed behavior modification techniques today to help Connie Clarke deal with her emotional eating and depression. She has agreed to take Wellbutrin IR 100 mg qd and  agreed to follow up as directed.  Obesity Connie Clarke is currently in the action stage of change. As such, her goal is to continue with weight loss efforts She has agreed to keep a food journal with 1100-1400 calories and 70 protein  Connie Clarke has been instructed to work up to a goal of 150 minutes of combined cardio and strengthening exercise per week for weight loss and overall health benefits. We discussed the following Behavioral Modification Stratagies today: increasing lean protein intake and holiday eating strategies    Connie Clarke has agreed to follow up with our clinic in 3 weeks. She was informed  of the importance of frequent follow up visits to maximize her success with intensive lifestyle modifications for her multiple health conditions.  I, Doreene Nest, am acting as scribe for Dennard Nip, MD  I have reviewed the above documentation for accuracy and completeness, and I agree with the above. -Dennard Nip, MD

## 2016-10-28 MED FILL — PROGESTERONE 100 MG CAPSULE: 100 | 90 days supply | Qty: 90 | Fill #2

## 2016-10-28 MED FILL — ESTRADIOL 0.05 MG PATCH: 0.05 | 84 days supply | Qty: 24 | Fill #2

## 2016-11-03 MED FILL — AMOXICILLIN 500 MG CAPSULE: 500 | 6 days supply | Qty: 25 | Fill #0

## 2016-11-03 MED FILL — TRAMADOL-ACETAMINOPHN 37.5-: 37.5-325 | 2 days supply | Qty: 16 | Fill #0

## 2016-11-19 ENCOUNTER — Ambulatory Visit (INDEPENDENT_AMBULATORY_CARE_PROVIDER_SITE_OTHER): Payer: 59 | Admitting: Family Medicine

## 2016-11-19 VITALS — BP 133/73 | HR 74 | Temp 97.5°F | Ht 64.0 in | Wt 190.0 lb

## 2016-11-19 DIAGNOSIS — F3289 Other specified depressive episodes: Secondary | ICD-10-CM | POA: Diagnosis not present

## 2016-11-19 DIAGNOSIS — E669 Obesity, unspecified: Secondary | ICD-10-CM | POA: Diagnosis not present

## 2016-11-19 DIAGNOSIS — Z6832 Body mass index (BMI) 32.0-32.9, adult: Secondary | ICD-10-CM | POA: Diagnosis not present

## 2016-11-19 MED ORDER — BUPROPION HCL 100 MG PO TABS: 100.0000 mg | ORAL_TABLET | Freq: Every day | ORAL | 0 refills | Status: DC

## 2016-11-19 MED FILL — buPROPion HCL 100 MG TABS: 100 | 30 days supply | Qty: 30 | Fill #0

## 2016-11-19 NOTE — Progress Notes (Signed)
Office: [phone]  /  Fax: [phone]   HPI:   Chief Complaint: OBESITY Connie Clarke is here to discuss her progress with her obesity treatment plan. She is on the  follow the Category 2 plan and is following her eating plan approximately 10 % of the time. She states she is walking, Pilates, Yoga and weights for 60 minutes 5 times per week. [name] was off track with increased travelling and celebrations. She tried to increase protein and portion control. She is ready to get back on track and has questions about meal prep/planning. Her weight is 190 lb (86.2 kg) today and has maintained weight over a period of 4 weeks since her last visit. She has lost 11 lbs since starting treatment with [name].  Depression with emotional eating behaviors [name] is doing well on lower dose of wellbutrin, insomnia is improved and her mood is stable. She is still working on decreasing emotional eating. [name] struggles with emotional eating and using food for comfort to the extent that it is negatively impacting her health. She often snacks when she is not hungry. [name] sometimes feels she is out of control and then feels guilty that she made poor food choices. She has been working on behavior modification techniques to help reduce her emotional eating and has been somewhat successful. She shows no sign of suicidal or homicidal ideations.  Depression screen PHQ 2/9 05/21/2016  Decreased Interest 2  Down, Depressed, Hopeless 3  PHQ - 2 Score 5  Altered sleeping 1  Tired, decreased energy 2  Change in appetite 3  Feeling bad or failure about yourself  3  Trouble concentrating 1  Moving slowly or fidgety/restless 1  Suicidal thoughts 0  PHQ-9 Score 16      ALLERGIES: No Known Allergies  MEDICATIONS: Current Outpatient Prescriptions on File Prior to Visit  Medication Sig Dispense Refill  . Cholecalciferol ([employer] VIT D 5000 HIGH-POTENCY) 5000 units capsule Take 5,000 Units by mouth daily.    [name] estradiol  (VIVELLE-DOT) 0.05 MG/24HR patch Place 0.05 patches onto the skin 2 (two) times a week.  11  . lisinopril (PRINIVIL,ZESTRIL) 10 MG tablet Take 10 mg by mouth every morning.  3  . Multiple Vitamins-Minerals (MULTIVITAMIN WITH MINERALS) tablet Take 1 tablet by mouth daily.    [name] omega-3 fish oil (MAXEPA) 1000 MG CAPS capsule Take 3 capsules (3,000 mg total) by mouth daily. 90 each   . progesterone (PROMETRIUM) 100 MG capsule Take 100 mg by mouth daily.     No current facility-administered medications on file prior to visit.     PAST MEDICAL HISTORY: Past Medical History:  Diagnosis Date  . Depression   . Hyperlipidemia   . Hypertension   . Infertility, female   . Joint pain   . Palpitations   . Vitamin D deficiency     PAST SURGICAL HISTORY: Past Surgical History:  Procedure Laterality Date  . NO PAST SURGERIES      SOCIAL HISTORY: Social History  Substance Use Topics  . Smoking status: Former Smoker    Quit date: 05/21/2014  . Smokeless tobacco: Never Used  . Alcohol use Not on file    FAMILY HISTORY: Family History  Problem Relation Age of Onset  . Stroke Mother   . Anxiety disorder Mother   . Obesity Mother   . Diabetes Father   . Hyperlipidemia Father   . Stroke Father   . Depression Father   . Obesity Father     ROS: Review  of Systems  Constitutional: Negative for weight loss.  Psychiatric/Behavioral: Positive for depression. Negative for suicidal ideas. The patient has insomnia.     PHYSICAL EXAM: Blood pressure 133/73, pulse 74, temperature 97.5 F (36.4 C), temperature source Oral, height 5\' 4"  (1.626 m), weight 190 lb (86.2 kg), last menstrual period 06/02/2005, SpO2 98 %. Body mass index is 32.61 kg/m. Physical Exam  Constitutional: She is oriented to person, place, and time. She appears well-developed and well-nourished.  Cardiovascular: Normal rate.   Pulmonary/Chest: Effort normal.  Musculoskeletal: Normal range of motion.  Neurological: She  is oriented to person, place, and time.  Skin: Skin is warm and dry.  Vitals reviewed.   RECENT LABS AND TESTS: BMET    Component Value Date/Time   NA 141 08/27/2016 0958   K 5.2 08/27/2016 0958   CL 101 08/27/2016 0958   CO2 25 08/27/2016 0958   GLUCOSE 89 08/27/2016 0958   BUN 24 08/27/2016 0958   CREATININE 0.78 08/27/2016 0958   CALCIUM 9.8 08/27/2016 0958   GFRNONAA 83 08/27/2016 0958   GFRAA 96 08/27/2016 0958   Lab Results  Component Value Date   HGBA1C 5.3 08/27/2016   HGBA1C 5.4 05/21/2016   Lab Results  Component Value Date   INSULIN 11.6 08/27/2016   INSULIN 13.5 05/21/2016   CBC    Component Value Date/Time   WBC 7.3 05/21/2016 1504   RBC 4.97 05/21/2016 1504   HGB 14.7 05/21/2016 1504   HCT 43.4 05/21/2016 1504   MCV 87 05/21/2016 1504   MCH 29.6 05/21/2016 1504   MCHC 33.9 05/21/2016 1504   RDW 13.1 05/21/2016 1504   LYMPHSABS 1.9 05/21/2016 1504   EOSABS 0.1 05/21/2016 1504   BASOSABS 0.0 05/21/2016 1504   Iron/TIBC/Ferritin/ %Sat No results found for: IRON, TIBC, FERRITIN, IRONPCTSAT Lipid Panel     Component Value Date/Time   CHOL 237 (H) 08/27/2016 0958   TRIG 114 08/27/2016 0958   HDL 54 08/27/2016 0958   LDLCALC 160 (H) 08/27/2016 0958   Hepatic Function Panel     Component Value Date/Time   PROT 6.9 08/27/2016 0958   ALBUMIN 4.2 08/27/2016 0958   AST 20 08/27/2016 0958   ALT 33 (H) 08/27/2016 0958   ALKPHOS 81 08/27/2016 0958   BILITOT 0.5 08/27/2016 0958      Component Value Date/Time   TSH 1.230 05/21/2016 1504    ASSESSMENT AND PLAN: Other depression - Plan: buPROPion (WELLBUTRIN) 100 MG tablet  Class 1 obesity without serious comorbidity with body mass index (BMI) of 32.0 to 32.9 in adult, unspecified obesity type  PLAN:  Depression with Emotional Eating Behaviors We discussed behavior modification techniques today to help [name] deal with her emotional eating and depression. She has agreed to continue Wellbutrin  and follow up as directed.  We spent > than 50% of the 15 minute visit on the counseling as documented in the note.  Obesity [name] is currently in the action stage of change. As such, her goal is to continue with weight loss efforts She has agreed to keep a food journal with 1100 to 1400 calories and 75+ grams of protein  Lorian has been instructed to work up to a goal of 150 minutes of combined cardio and strengthening exercise per week for weight loss and overall health benefits. We discussed the following Behavioral Modification Strategies today: meal planning & cooking strategies, increasing lean protein intake and emotional eating strategies  Jenay has agreed to follow up with our  clinic in 2 weeks. She was informed of the importance of frequent follow up visits to maximize her success with intensive lifestyle modifications for her multiple health conditions.  I, Doreene Nest, am acting as transcriptionist for Dennard Nip, MD  I have reviewed the above documentation for accuracy and completeness, and I agree with the above. -Dennard Nip, MD  OBESITY BEHAVIORAL INTERVENTION VISIT  Today's visit was # 10 out of 22.  Starting weight: 201 lbs Starting date: 05/21/16 Today's weight : 190 lbs Today's date: 11/19/2016 Total lbs lost to date: 11 (Patients must lose 7 lbs in the first 6 months to continue with counseling)   ASK: We discussed the diagnosis of obesity with Kipp Laurence today and Jennylee agreed to give [name] permission to discuss obesity behavioral modification therapy today.  ASSESS: Sephora has the diagnosis of obesity and her BMI today is 32.7 Lashawnna is in the action stage of change   ADVISE: Khalea was educated on the multiple health risks of obesity as well as the benefit of weight loss to improve her health. She was advised of the need for long term treatment and the importance of lifestyle modifications.  AGREE: Multiple dietary modification options and  treatment options were discussed and  Maryalice agreed to keep a food journal with 1100 to 1400 calories and 75+ grams of protein  We discussed the following Behavioral Modification Strategies today: meal planning & cooking strategies, increasing lean protein intake and emotional eating strategies

## 2016-12-15 ENCOUNTER — Ambulatory Visit (INDEPENDENT_AMBULATORY_CARE_PROVIDER_SITE_OTHER): Payer: 59 | Admitting: Family Medicine

## 2016-12-15 VITALS — BP 105/68 | HR 82 | Temp 97.5°F | Ht 64.0 in | Wt 188.0 lb

## 2016-12-15 DIAGNOSIS — Z6832 Body mass index (BMI) 32.0-32.9, adult: Secondary | ICD-10-CM

## 2016-12-15 DIAGNOSIS — F3289 Other specified depressive episodes: Secondary | ICD-10-CM | POA: Diagnosis not present

## 2016-12-15 DIAGNOSIS — I10 Essential (primary) hypertension: Secondary | ICD-10-CM

## 2016-12-15 DIAGNOSIS — E669 Obesity, unspecified: Secondary | ICD-10-CM

## 2016-12-15 DIAGNOSIS — Z9189 Other specified personal risk factors, not elsewhere classified: Secondary | ICD-10-CM

## 2016-12-15 MED ORDER — BUPROPION HCL 100 MG PO TABS: 100.0000 mg | ORAL_TABLET | Freq: Every day | ORAL | 0 refills | Status: DC

## 2016-12-15 MED FILL — buPROPion HCL 100 MG TABS: 100 | 30 days supply | Qty: 30 | Fill #0

## 2016-12-15 NOTE — Progress Notes (Signed)
Office: [phone]  /  Fax: [phone]   HPI:   Chief Complaint: OBESITY Connie Clarke is here to discuss her progress with her obesity treatment plan. She is on the  keep a food journal with 1100 to 1400 calories and 75+ grams of protein weekly and is following her eating plan approximately 70 % of the time. She states she is walking/strength training for 60/20 minutes 7/3 times per week. Connie Clarke continues to do well with weight loss. She is still struggling with planning meals ahead of time. She has worked on Teacher, adult education exercise to help with increasing metabolic rate. Her weight is 188 lb (85.3 kg) today and has had a weight loss of 2 pounds over a period of 4 weeks since her last visit. She has lost 13 lbs since starting treatment with [name].  Depression with emotional eating behaviors Connie Clarke is sleeping better on wellbutrin, mood is stable and she has decreased emotional eating. Connie Clarke struggles with emotional eating and using food for comfort to the extent that it is negatively impacting her health. She often snacks when she is not hungry. Connie Clarke sometimes feels she is out of control and then feels guilty that she made poor food choices. She has been working on behavior modification techniques to help reduce her emotional eating and has been somewhat successful. She shows no sign of suicidal or homicidal ideations.  Hypertension Connie Clarke is a 61 y.o. female with hypertension. Her blood pressure is slowly improving with diet and weight loss. Connie Clarke denies chest pain, headache, lightheadedness, dizziness or shortness of breath on exertion. She is working weight loss to help control her blood pressure with the goal of decreasing her risk of heart attack and stroke. Connie Clarke blood pressure is not currently controlled.    Depression screen PHQ 2/9 05/21/2016  Decreased Interest 2  Down, Depressed, Hopeless 3  PHQ - 2 Score 5  Altered sleeping 1  Tired, decreased energy  2  Change in appetite 3  Feeling bad or failure about yourself  3  Trouble concentrating 1  Moving slowly or fidgety/restless 1  Suicidal thoughts 0  PHQ-9 Score 16     ALLERGIES: No Known Allergies  MEDICATIONS: Current Outpatient Prescriptions on File Prior to Visit  Medication Sig Dispense Refill  . Cholecalciferol ([employer] VIT D 5000 HIGH-POTENCY) 5000 units capsule Take 5,000 Units by mouth daily.    [name] estradiol (VIVELLE-DOT) 0.05 MG/24HR patch Place 0.05 patches onto the skin 2 (two) times a week.  11  . lisinopril (PRINIVIL,ZESTRIL) 10 MG tablet Take 10 mg by mouth every morning.  3  . Multiple Vitamins-Minerals (MULTIVITAMIN WITH MINERALS) tablet Take 1 tablet by mouth daily.    [name] omega-3 fish oil (MAXEPA) 1000 MG CAPS capsule Take 3 capsules (3,000 mg total) by mouth daily. 90 each   . progesterone (PROMETRIUM) 100 MG capsule Take 100 mg by mouth daily.     No current facility-administered medications on file prior to visit.     PAST MEDICAL HISTORY: Past Medical History:  Diagnosis Date  . Depression   . Hyperlipidemia   . Hypertension   . Infertility, female   . Joint pain   . Palpitations   . Vitamin D deficiency     PAST SURGICAL HISTORY: Past Surgical History:  Procedure Laterality Date  . NO PAST SURGERIES      SOCIAL HISTORY: Social History  Substance Use Topics  . Smoking status: Former Smoker    Quit date: 05/21/2014  .  Smokeless tobacco: Never Used  . Alcohol use Not on file    FAMILY HISTORY: Family History  Problem Relation Age of Onset  . Stroke Mother   . Anxiety disorder Mother   . Obesity Mother   . Diabetes Father   . Hyperlipidemia Father   . Stroke Father   . Depression Father   . Obesity Father     ROS: Review of Systems  Constitutional: Positive for weight loss.  Respiratory: Negative for shortness of breath (on exertion).   Cardiovascular: Negative for chest pain.  Neurological: Negative for dizziness and headaches.        Negative lightheadedness  Psychiatric/Behavioral: Positive for depression. Negative for suicidal ideas.    PHYSICAL EXAM: Blood pressure 105/68, pulse 82, temperature (!) 97.5 F (36.4 C), temperature source Oral, height 5\' 4"  (1.626 m), weight 188 lb (85.3 kg), last menstrual period 06/02/2005, SpO2 97 %. Body mass index is 32.27 kg/m. Physical Exam  Constitutional: She is oriented to person, place, and time. She appears well-developed and well-nourished.  Cardiovascular: Normal rate.   Pulmonary/Chest: Effort normal.  Musculoskeletal: Normal range of motion.  Neurological: She is oriented to person, place, and time.  Skin: Skin is warm and dry.  Psychiatric: She has a normal mood and affect. Her behavior is normal.  Vitals reviewed.   RECENT LABS AND TESTS: BMET    Component Value Date/Time   NA 141 08/27/2016 0958   K 5.2 08/27/2016 0958   CL 101 08/27/2016 0958   CO2 25 08/27/2016 0958   GLUCOSE 89 08/27/2016 0958   BUN 24 08/27/2016 0958   CREATININE 0.78 08/27/2016 0958   CALCIUM 9.8 08/27/2016 0958   GFRNONAA 83 08/27/2016 0958   GFRAA 96 08/27/2016 0958   Lab Results  Component Value Date   HGBA1C 5.3 08/27/2016   HGBA1C 5.4 05/21/2016   Lab Results  Component Value Date   INSULIN 11.6 08/27/2016   INSULIN 13.5 05/21/2016   CBC    Component Value Date/Time   WBC 7.3 05/21/2016 1504   RBC 4.97 05/21/2016 1504   HGB 14.7 05/21/2016 1504   HCT 43.4 05/21/2016 1504   MCV 87 05/21/2016 1504   MCH 29.6 05/21/2016 1504   MCHC 33.9 05/21/2016 1504   RDW 13.1 05/21/2016 1504   LYMPHSABS 1.9 05/21/2016 1504   EOSABS 0.1 05/21/2016 1504   BASOSABS 0.0 05/21/2016 1504   Iron/TIBC/Ferritin/ %Sat No results found for: IRON, TIBC, FERRITIN, IRONPCTSAT Lipid Panel     Component Value Date/Time   CHOL 237 (H) 08/27/2016 0958   TRIG 114 08/27/2016 0958   HDL 54 08/27/2016 0958   LDLCALC 160 (H) 08/27/2016 0958   Hepatic Function Panel     Component  Value Date/Time   PROT 6.9 08/27/2016 0958   ALBUMIN 4.2 08/27/2016 0958   AST 20 08/27/2016 0958   ALT 33 (H) 08/27/2016 0958   ALKPHOS 81 08/27/2016 0958   BILITOT 0.5 08/27/2016 0958      Component Value Date/Time   TSH 1.230 05/21/2016 1504    ASSESSMENT AND PLAN: Essential hypertension  Other depression - Plan: buPROPion (WELLBUTRIN) 100 MG tablet  Class 1 obesity with serious comorbidity and body mass index (BMI) of 32.0 to 32.9 in adult, unspecified obesity type  PLAN:  Depression with Emotional Eating Behaviors We discussed behavior modification techniques today to help Connie Clarke deal with her emotional eating and depression. She has agreed to continue to take Wellbutrin 100 mg qd, we will refill for 1  month and she agreed to follow up as directed.  Hypertension We discussed sodium restriction, working on healthy weight loss, and a regular exercise program as the means to achieve improved blood pressure control. Connie Clarke agreed with this plan and agreed to follow up as directed. We will continue to monitor her blood pressure as well as her progress with the above lifestyle modifications. She will continue her medications as prescribed and will watch for signs of hypotension as she continues her lifestyle modifications. Connie Clarke agrees to increase H2O intake, will likely be decreasing medications soon with continued weight loss.  Cardiovascular risk counselling Connie Clarke was given extended (at least 15 minutes) coronary artery disease prevention counseling today. She is 61 y.o. female and has risk factors for heart disease including obesity. We discussed intensive lifestyle modifications today with an emphasis on specific weight loss instructions and strategies. Pt was also informed of the importance of increasing exercise and decreasing saturated fats to help prevent heart disease.  Obesity Connie Clarke is currently in the action stage of change. As such, her goal is to continue with weight  loss efforts She has agreed to follow the Category 2 plan Connie Clarke has been instructed to work up to a goal of 150 minutes of combined cardio and strengthening exercise per week or continue walking/strength training for 60/20 minutes 7/3 times per week for weight loss and overall health benefits. We discussed the following Behavioral Modification Strategies today: emotional eating strategies, meal planning & cooking strategies and planning for success  Connie Clarke has agreed to follow up with our clinic in 2 to 3 weeks. She was informed of the importance of frequent follow up visits to maximize her success with intensive lifestyle modifications for her multiple health conditions.  I, Doreene Nest, am acting as transcriptionist for Connie Nip, MD  I have reviewed the above documentation for accuracy and completeness, and I agree with the above. -Connie Nip, MD   OBESITY BEHAVIORAL INTERVENTION VISIT  Today's visit was # 11 out of 22.  Starting weight: 201 lbs Starting date: 05/21/16 Today's weight : 188 lbs Today's date: 12/16/2016 Total lbs lost to date: 13 (Patients must lose 7 lbs in the first 6 months to continue with counseling)   ASK: We discussed the diagnosis of obesity with Connie Clarke today and Shanay agreed to give [name] permission to discuss obesity behavioral modification therapy today.  ASSESS: Shriya has the diagnosis of obesity and her BMI today is 32.3 Kelee is in the action stage of change   ADVISE: Kyree was educated on the multiple health risks of obesity as well as the benefit of weight loss to improve her health. She was advised of the need for long term treatment and the importance of lifestyle modifications.  AGREE: Multiple dietary modification options and treatment options were discussed and  Audrey agreed to follow the Category 2 plan We discussed the following Behavioral Modification Strategies today: emotional eating strategies, meal planning & cooking  strategies and planning for success

## 2017-01-05 ENCOUNTER — Ambulatory Visit (INDEPENDENT_AMBULATORY_CARE_PROVIDER_SITE_OTHER): Payer: 59 | Admitting: Family Medicine

## 2017-01-05 VITALS — BP 127/75 | HR 76 | Temp 98.0°F | Ht 64.0 in | Wt 187.0 lb

## 2017-01-05 DIAGNOSIS — Z6832 Body mass index (BMI) 32.0-32.9, adult: Secondary | ICD-10-CM

## 2017-01-05 DIAGNOSIS — E559 Vitamin D deficiency, unspecified: Secondary | ICD-10-CM

## 2017-01-05 DIAGNOSIS — R5383 Other fatigue: Secondary | ICD-10-CM | POA: Diagnosis not present

## 2017-01-05 DIAGNOSIS — Z9189 Other specified personal risk factors, not elsewhere classified: Secondary | ICD-10-CM | POA: Diagnosis not present

## 2017-01-05 DIAGNOSIS — E784 Other hyperlipidemia: Secondary | ICD-10-CM

## 2017-01-05 DIAGNOSIS — R0602 Shortness of breath: Secondary | ICD-10-CM

## 2017-01-05 DIAGNOSIS — E8881 Metabolic syndrome: Secondary | ICD-10-CM

## 2017-01-05 DIAGNOSIS — R748 Abnormal levels of other serum enzymes: Secondary | ICD-10-CM | POA: Diagnosis not present

## 2017-01-05 DIAGNOSIS — E669 Obesity, unspecified: Secondary | ICD-10-CM | POA: Diagnosis not present

## 2017-01-05 DIAGNOSIS — E7849 Other hyperlipidemia: Secondary | ICD-10-CM

## 2017-01-05 MED ORDER — CHOLECALCIFEROL 125 MCG (5000 UT) PO CAPS: 5000.0000 [IU] | ORAL_CAPSULE | Freq: Every day | ORAL | Status: DC

## 2017-01-05 NOTE — Progress Notes (Signed)
Office: [phone]  /  Fax: [phone]   HPI:   Chief Complaint: OBESITY Connie Clarke is here to discuss her progress with her obesity treatment plan. She is on the Category 2 plan and is following her eating plan approximately 75 % of the time. She states she is exercising 60 minutes 6 times per week. Connie Clarke continues to do well with weight loss. She has increased her exercise but is deviating from her plan more. She has started doing more research and has questions about about omega-3's, intermittent fasting and ketogenic diet. Her weight is 187 lb (84.8 kg) today and has had a weight loss of 1 pound over a period of 3 weeks since her last visit. She has lost 14 lbs since starting treatment with [name].  Fatigue with daytime somnolence Connie Clarke struggles with insomnia and feels this may be due to her low dose of wellbutrin. She notes daytime somnolence and wakes frequently through the night. Connie Clarke admits snoring and there are no witnessed apneic events.  Dyspnea with Exercise Connie Clarke still notes shortness of breath with exercising and is slightly improved with increased exercise. Agustina denies shortness of breath at rest.  Elevated Liver Function Test Connie Clarke has a diagnosis of elevated ALT that is improving with weight loss and is likely due to non alcoholic fatty liver disease. She denies abdominal pain or jaundice. She denies excessive alcohol intake.  Vitamin D deficiency Connie Clarke has a diagnosis of vitamin D deficiency. She is currently taking OTC vit D 5,000 IU daily, last level was at goal but she is at higher risk of over replacement due to weight loss. Connie Clarke denies nausea, vomiting or muscle weakness.  Insulin Resistance Connie Clarke has a diagnosis of insulin resistance based on her elevated fasting insulin level >5. Although Connie Clarke's blood glucose readings are still under good control, insulin resistance puts her at greater risk of metabolic syndrome and diabetes. She is not taking metformin  currently and continues to attempt to control her insulin resistance with decreasing simple carbohydrates, diet and exercise to decrease risk of diabetes.  Hyperlipidemia Connie Clarke has hyperlipidemia and has been trying to control her cholesterol levels with intensive lifestyle modification including a low saturated fat diet, exercise and weight loss. She denies any chest pain, claudication or myalgias.  At risk for diabetes Connie Clarke is at higher than average risk for developing diabetes due to her obesity and insulin resistance. She currently denies polyuria or polydipsia.   ALLERGIES: No Known Allergies  MEDICATIONS: Current Outpatient Prescriptions on File Prior to Visit  Medication Sig Dispense Refill  . Cholecalciferol ([employer]S VIT D 5000 HIGH-POTENCY) 5000 units capsule Take 5,000 Units by mouth daily.    [name] estradiol (VIVELLE-DOT) 0.05 MG/24HR patch Place 0.05 patches onto the skin 2 (two) times a week.  11  . lisinopril (PRINIVIL,ZESTRIL) 10 MG tablet Take 10 mg by mouth every morning.  3  . Multiple Vitamins-Minerals (MULTIVITAMIN WITH MINERALS) tablet Take 1 tablet by mouth daily.    [name] omega-3 fish oil (MAXEPA) 1000 MG CAPS capsule Take 3 capsules (3,000 mg total) by mouth daily. 90 each   . progesterone (PROMETRIUM) 100 MG capsule Take 100 mg by mouth daily.     No current facility-administered medications on file prior to visit.     PAST MEDICAL HISTORY: Past Medical History:  Diagnosis Date  . Depression   . Hyperlipidemia   . Hypertension   . Infertility, female   . Joint pain   . Palpitations   . Vitamin D  deficiency     PAST SURGICAL HISTORY: Past Surgical History:  Procedure Laterality Date  . NO PAST SURGERIES      SOCIAL HISTORY: Social History  Substance Use Topics  . Smoking status: Former Smoker    Quit date: 05/21/2014  . Smokeless tobacco: Never Used  . Alcohol use Not on file    FAMILY HISTORY: Family History  Problem Relation Age of Onset  .  Stroke Mother   . Anxiety disorder Mother   . Obesity Mother   . Diabetes Father   . Hyperlipidemia Father   . Stroke Father   . Depression Father   . Obesity Father     ROS: Review of Systems  Constitutional: Positive for malaise/fatigue and weight loss.  Respiratory: Positive for shortness of breath (with exercise).        Negative shortness of breath at rest  Cardiovascular: Negative for chest pain and claudication.  Gastrointestinal: Negative for abdominal pain, nausea and vomiting.       Negative jaundice  Genitourinary: Negative for frequency.  Musculoskeletal: Negative for myalgias.       Negative muscle weakness  Endo/Heme/Allergies: Negative for polydipsia.  Psychiatric/Behavioral: The patient has insomnia.     PHYSICAL EXAM: Blood pressure 127/75, pulse 76, temperature 98 F (36.7 C), temperature source Oral, height 5\' 4"  (1.626 m), weight 187 lb (84.8 kg), last menstrual period 06/02/2005, SpO2 97 %. Body mass index is 32.1 kg/m. Physical Exam  Constitutional: She is oriented to person, place, and time. She appears well-developed and well-nourished.  Cardiovascular: Normal rate.   Pulmonary/Chest: Effort normal.  Musculoskeletal: Normal range of motion.  Neurological: She is oriented to person, place, and time.  Skin: Skin is warm and dry.  Psychiatric: She has a normal mood and affect. Her behavior is normal.  Vitals reviewed.   RECENT LABS AND TESTS: BMET    Component Value Date/Time   NA 141 08/27/2016 0958   K 5.2 08/27/2016 0958   CL 101 08/27/2016 0958   CO2 25 08/27/2016 0958   GLUCOSE 89 08/27/2016 0958   BUN 24 08/27/2016 0958   CREATININE 0.78 08/27/2016 0958   CALCIUM 9.8 08/27/2016 0958   GFRNONAA 83 08/27/2016 0958   GFRAA 96 08/27/2016 0958   Lab Results  Component Value Date   HGBA1C 5.3 08/27/2016   HGBA1C 5.4 05/21/2016   Lab Results  Component Value Date   INSULIN 11.6 08/27/2016   INSULIN 13.5 05/21/2016   CBC      Component Value Date/Time   WBC 7.3 05/21/2016 1504   RBC 4.97 05/21/2016 1504   HGB 14.7 05/21/2016 1504   HCT 43.4 05/21/2016 1504   MCV 87 05/21/2016 1504   MCH 29.6 05/21/2016 1504   MCHC 33.9 05/21/2016 1504   RDW 13.1 05/21/2016 1504   LYMPHSABS 1.9 05/21/2016 1504   EOSABS 0.1 05/21/2016 1504   BASOSABS 0.0 05/21/2016 1504   Iron/TIBC/Ferritin/ %Sat No results found for: IRON, TIBC, FERRITIN, IRONPCTSAT Lipid Panel     Component Value Date/Time   CHOL 237 (H) 08/27/2016 0958   TRIG 114 08/27/2016 0958   HDL 54 08/27/2016 0958   LDLCALC 160 (H) 08/27/2016 0958   Hepatic Function Panel     Component Value Date/Time   PROT 6.9 08/27/2016 0958   ALBUMIN 4.2 08/27/2016 0958   AST 20 08/27/2016 0958   ALT 33 (H) 08/27/2016 0958   ALKPHOS 81 08/27/2016 0958   BILITOT 0.5 08/27/2016 0958      Component  Value Date/Time   TSH 1.230 05/21/2016 1504    ASSESSMENT AND PLAN: Increased liver enzymes  Other hyperlipidemia - Plan: Lipid Panel With LDL/HDL Ratio  Insulin resistance - Plan: Comprehensive metabolic panel, Hemoglobin A1c, Insulin, random  Vitamin D deficiency - Plan: VITAMIN D 25 Hydroxy (Vit-D Deficiency, Fractures), Cholecalciferol (CVS VIT D 5000 HIGH-POTENCY) 5000 units capsule  Other fatigue - with daytime somnolence  Shortness of breath on exertion  At risk for diabetes mellitus  Class 1 obesity with serious comorbidity and body mass index (BMI) of 32.0 to 32.9 in adult, unspecified obesity type  PLAN:  Fatigue with daytime somnolence Connie Clarke was informed that her fatigue may be related to obesity, depression or many other causes. Myrikal agrees to discontinue wellbutrin and we will refer to Connie Clarke for consultation and in the meanwhile Connie Clarke has agreed to work on diet, exercise and weight loss to help with fatigue. Proper sleep hygiene was discussed including the need for 7-8 hours of quality sleep each night.  Dyspnea with  exercise Connie Clarke's shortness of breath is likely due to exercise intolerance. She will continue with exercise and weight loss and work on strengthening exercise to increase her basal metabolic rate. Connie Clarke agrees to follow up with our clinic in 3 to 4 weeks.  Elevated Liver Function Test We discussed the likely diagnosis of non alcoholic fatty liver disease today and how this condition is obesity related. Connie Clarke was educated on her risk of developing NASH or even liver failure and the only proven treatment for NAFLD was weight loss. Connie Clarke agreed to continue with her weight loss efforts with healthier diet and exercise as an essential part of her treatment plan.  Vitamin D Deficiency Connie Clarke was informed that low vitamin D levels contributes to fatigue and are associated with obesity, breast, and colon cancer. She agrees to continue to take OTC Vit D @5 ,000 IU every day for now and we will re-check labs and will follow up for routine testing of vitamin D, at least 2-3 times per year. She was informed of the risk of over-replacement of vitamin D and agrees to not increase her dose unless he discusses this with [name] first. Connie Clarke agrees to follow up with our clinic in 3 to 4 weeks.  Insulin Resistance Connie Clarke will continue to work on weight loss, exercise, and decreasing simple carbohydrates in her diet to help decrease the risk of diabetes.  She was informed that eating too many simple carbohydrates or too many calories at one sitting increases the likelihood of GI side effects.  Connie Clarke agreed to follow up with [name] as directed to monitor her progress.  Hyperlipidemia Connie Clarke was informed of the American Heart Association Guidelines emphasizing intensive lifestyle modifications as the first line treatment for hyperlipidemia. We discussed many lifestyle modifications today in depth, and Zarai will continue to work on decreasing saturated fats such as fatty red meat, butter and many fried foods. She will also  increase vegetables and lean protein in her diet and continue to work on exercise and weight loss efforts. We will check labs and Connie Clarke agrees to follow up at the agreed upon time.  Diabetes risk counselling Connie Clarke was given extended (15 minutes) diabetes prevention counseling today. She is 61 y.o. female and has risk factors for diabetes including obesity and insulin resistance. We discussed intensive lifestyle modifications today with an emphasis on weight loss as well as increasing exercise and decreasing simple carbohydrates in her diet.  Obesity Sharmin is currently in the action  stage of change. As such, her goal is to continue with weight loss efforts She has agreed to keep a food journal with 1200 to 1400 calories and 75+ grams of protein and less than 30 grams of sugar daily Trish has been instructed to work up to a goal of 150 minutes of combined cardio and strengthening exercise per week for weight loss and overall health benefits. We discussed the following Behavioral Modification Strategies today: no skipping meals, keep a strict food journal, increasing lean protein intake, decreasing simple carbohydrates  and increasing vegetables  Madhuri was encouraged to eat fish 2 to 3 times per week to get additional omega-3's and was advised that the ketogenic diet is very high in fat and makes long term weight loss difficult and intermittent fasting encourages meal skipping and possible decrease in metabolic rate. She will change to journaling instead.  Aleigha has agreed to follow up with our clinic in 3 to 4 weeks. She was informed of the importance of frequent follow up visits to maximize her success with intensive lifestyle modifications for her multiple health conditions.  I, Doreene Nest, am acting as transcriptionist for Dennard Nip, MD  I have reviewed the above documentation for accuracy and completeness, and I agree with the above. -Dennard Nip, MD   OBESITY BEHAVIORAL  INTERVENTION VISIT  Today's visit was # 12 out of 22.  Starting weight: 201 lbs Starting date: 05/21/17 Today's weight : 187 lbs Today's date: 01/05/2017 Total lbs lost to date: 14 (Patients must lose 7 lbs in the first 6 months to continue with counseling)   ASK: We discussed the diagnosis of obesity with Kipp Laurence today and [name] agreed to give [name] permission to discuss obesity behavioral modification therapy today.  ASSESS: Aiman has the diagnosis of obesity and her BMI today is 32.2 Ivery is in the action stage of change   ADVISE: Seila was educated on the multiple health risks of obesity as well as the benefit of weight loss to improve her health. She was advised of the need for long term treatment and the importance of lifestyle modifications.  AGREE: Multiple dietary modification options and treatment options were discussed and  Dalores agreed to keep a food journal with 1200 to 1400 calories and 75+ grams of protein and less than 30 grams of sugar daily We discussed the following Behavioral Modification Strategies today: no skipping meals, keep a strict food journal, increasing lean protein intake, decreasing simple carbohydrates  and increasing vegetables

## 2017-01-06 LAB — COMPREHENSIVE METABOLIC PANEL
ALBUMIN: 4.3 g/dL (ref 3.6–4.8)
ALT: 56 IU/L — AB (ref 0–32)
AST: 33 IU/L (ref 0–40)
Albumin/Globulin Ratio: 1.9 (ref 1.2–2.2)
Alkaline Phosphatase: 84 IU/L (ref 39–117)
BILIRUBIN TOTAL: 0.4 mg/dL (ref 0.0–1.2)
BUN / CREAT RATIO: 27 (ref 12–28)
BUN: 23 mg/dL (ref 8–27)
CALCIUM: 9.5 mg/dL (ref 8.7–10.3)
CO2: 23 mmol/L (ref 20–29)
Chloride: 103 mmol/L (ref 96–106)
Creatinine, Ser: 0.85 mg/dL (ref 0.57–1.00)
GFR, EST AFRICAN AMERICAN: 86 mL/min/{1.73_m2} (ref >59)
GFR, EST NON AFRICAN AMERICAN: 74 mL/min/{1.73_m2} (ref >59)
GLUCOSE: 93 mg/dL (ref 65–99)
Globulin, Total: 2.3 g/dL (ref 1.5–4.5)
Potassium: 4.5 mmol/L (ref 3.5–5.2)
Sodium: 139 mmol/L (ref 134–144)
TOTAL PROTEIN: 6.6 g/dL (ref 6.0–8.5)

## 2017-01-06 LAB — LIPID PANEL WITH LDL/HDL RATIO
CHOLESTEROL TOTAL: 220 mg/dL — AB (ref 100–199)
HDL: 56 mg/dL (ref >39)
LDL Calculated: 139 mg/dL — ABNORMAL HIGH (ref 0–99)
LDl/HDL Ratio: 2.5 ratio (ref 0.0–3.2)
Triglycerides: 126 mg/dL (ref 0–149)
VLDL CHOLESTEROL CAL: 25 mg/dL (ref 5–40)

## 2017-01-06 LAB — VITAMIN D 25 HYDROXY (VIT D DEFICIENCY, FRACTURES): VIT D 25 HYDROXY: 52.9 ng/mL (ref 30.0–100.0)

## 2017-01-06 LAB — HEMOGLOBIN A1C
Est. average glucose Bld gHb Est-mCnc: 105 mg/dL
Hgb A1c MFr Bld: 5.3 % (ref 4.8–5.6)

## 2017-01-06 LAB — INSULIN, RANDOM: INSULIN: 11.6 u[IU]/mL (ref 2.6–24.9)

## 2017-01-06 MED FILL — LISINOPRIL 10 MG TABLET: 10 | 90 days supply | Qty: 90 | Fill #1

## 2017-01-20 MED FILL — ESTRADIOL 0.05 MG PATCH: 0.05 | 84 days supply | Qty: 24 | Fill #3

## 2017-01-20 MED FILL — PROGESTERONE 100 MG CAPSULE: 100 | 90 days supply | Qty: 90 | Fill #3

## 2017-01-26 ENCOUNTER — Ambulatory Visit (INDEPENDENT_AMBULATORY_CARE_PROVIDER_SITE_OTHER): Payer: 59 | Admitting: Family Medicine

## 2017-01-26 VITALS — BP 114/76 | HR 89 | Temp 98.0°F | Ht 64.0 in | Wt 186.0 lb

## 2017-01-26 DIAGNOSIS — Z6832 Body mass index (BMI) 32.0-32.9, adult: Secondary | ICD-10-CM

## 2017-01-26 DIAGNOSIS — E559 Vitamin D deficiency, unspecified: Secondary | ICD-10-CM

## 2017-01-26 DIAGNOSIS — R7989 Other specified abnormal findings of blood chemistry: Secondary | ICD-10-CM

## 2017-01-26 DIAGNOSIS — R945 Abnormal results of liver function studies: Principal | ICD-10-CM

## 2017-01-26 DIAGNOSIS — E669 Obesity, unspecified: Secondary | ICD-10-CM

## 2017-01-26 NOTE — Progress Notes (Signed)
Office: [phone]  /  Fax: [phone]   HPI:   Chief Complaint: OBESITY Connie Clarke is here to discuss her progress with her obesity treatment plan. She is on the keep Clarke food journal with 1200 to 1400 calories and 75+ grams of protein daily and is following her eating plan approximately 25 % of the time. She states she is exercising at the gym and walkiing for 45 to 60 minutes 6 times per week. Connie Clarke continues to lose weight and is exercising but is not journaling as strictly. She thinks her protein is still 80+ grams per day. Her weight is 186 lb (84.4 kg) today and has had Clarke weight loss of 1 pound over Clarke period of 3 weeks since her last visit. She has lost 15 lbs since starting treatment with [name].  Vitamin D deficiency Connie Clarke has Clarke diagnosis of vitamin D deficiency. She is currently taking OTC vit D 5,000 units daily and level is now at goal, not over replaced. She denies nausea, vomiting or muscle weakness.  Elevated LFT Connie Clarke has Clarke dx of elevated ALT, slightly elevated from last lab. She denies abdominal pain or jaundice and has never been told of any liver problems in the past. She denies excessive alcohol intake. She thinks she had additional labs at her PCP before.  ALLERGIES: No Known Allergies  MEDICATIONS: Current Outpatient Prescriptions on File Prior to Visit  Medication Sig Dispense Refill  . Cholecalciferol ([employer] VIT D 5000 HIGH-POTENCY) 5000 units capsule Take 1 capsule (5,000 Units total) by mouth daily.    Connie Clarke Kitchen estradiol (VIVELLE-DOT) 0.05 MG/24HR patch Place 0.05 patches onto the skin 2 (two) times Clarke week.  11  . lisinopril (PRINIVIL,ZESTRIL) 10 MG tablet Take 10 mg by mouth every morning.  3  . Multiple Vitamins-Minerals (MULTIVITAMIN WITH MINERALS) tablet Take 1 tablet by mouth daily.    Connie Clarke Kitchen omega-3 fish oil (MAXEPA) 1000 MG CAPS capsule Take 3 capsules (3,000 mg total) by mouth daily. 90 each   . progesterone (PROMETRIUM) 100 MG capsule Take 100 mg by mouth daily.      No current facility-administered medications on file prior to visit.     PAST MEDICAL HISTORY: Past Medical History:  Diagnosis Date  . Depression   . Hyperlipidemia   . Hypertension   . Infertility, female   . Joint pain   . Palpitations   . Vitamin D deficiency     PAST SURGICAL HISTORY: Past Surgical History:  Procedure Laterality Date  . NO PAST SURGERIES      SOCIAL HISTORY: Social History  Substance Use Topics  . Smoking status: Former Smoker    Quit date: 05/21/2014  . Smokeless tobacco: Never Used  . Alcohol use Not on file    FAMILY HISTORY: Family History  Problem Relation Age of Onset  . Stroke Mother   . Anxiety disorder Mother   . Obesity Mother   . Diabetes Father   . Hyperlipidemia Father   . Stroke Father   . Depression Father   . Obesity Father     ROS: Review of Systems  Constitutional: Positive for weight loss.  Gastrointestinal: Negative for abdominal pain, nausea and vomiting.       Negative jaundice  Musculoskeletal:       Negative muscle weakness    PHYSICAL EXAM: Blood pressure 114/76, pulse 89, temperature 98 F (36.7 C), temperature source Oral, height 5\' 4"  (1.626 m), weight 186 lb (84.4 kg), last menstrual period 06/02/2005, SpO2 97 %. Body  mass index is 31.93 kg/m. Physical Exam  Constitutional: She is oriented to person, place, and time. She appears well-developed and well-nourished.  Cardiovascular: Normal rate.   Pulmonary/Chest: Effort normal.  Musculoskeletal: Normal range of motion.  Neurological: She is oriented to person, place, and time.  Skin: Skin is warm and dry.  Psychiatric: She has Clarke normal mood and affect. Her behavior is normal.  Vitals reviewed.   RECENT LABS AND TESTS: BMET    Component Value Date/Time   NA 139 01/05/2017 0839   K 4.5 01/05/2017 0839   CL 103 01/05/2017 0839   CO2 23 01/05/2017 0839   GLUCOSE 93 01/05/2017 0839   BUN 23 01/05/2017 0839   CREATININE 0.85 01/05/2017 0839    CALCIUM 9.5 01/05/2017 0839   GFRNONAA 74 01/05/2017 0839   GFRAA 86 01/05/2017 0839   Lab Results  Component Value Date   HGBA1C 5.3 01/05/2017   HGBA1C 5.3 08/27/2016   HGBA1C 5.4 05/21/2016   Lab Results  Component Value Date   INSULIN 11.6 01/05/2017   INSULIN 11.6 08/27/2016   INSULIN 13.5 05/21/2016   CBC    Component Value Date/Time   WBC 7.3 05/21/2016 1504   RBC 4.97 05/21/2016 1504   HGB 14.7 05/21/2016 1504   HCT 43.4 05/21/2016 1504   MCV 87 05/21/2016 1504   MCH 29.6 05/21/2016 1504   MCHC 33.9 05/21/2016 1504   RDW 13.1 05/21/2016 1504   LYMPHSABS 1.9 05/21/2016 1504   EOSABS 0.1 05/21/2016 1504   BASOSABS 0.0 05/21/2016 1504   Iron/TIBC/Ferritin/ %Sat No results found for: IRON, TIBC, FERRITIN, IRONPCTSAT Lipid Panel     Component Value Date/Time   CHOL 220 (H) 01/05/2017 0839   TRIG 126 01/05/2017 0839   HDL 56 01/05/2017 0839   LDLCALC 139 (H) 01/05/2017 0839   Hepatic Function Panel     Component Value Date/Time   PROT 6.6 01/05/2017 0839   ALBUMIN 4.3 01/05/2017 0839   AST 33 01/05/2017 0839   ALT 56 (H) 01/05/2017 0839   ALKPHOS 84 01/05/2017 0839   BILITOT 0.4 01/05/2017 0839      Component Value Date/Time   TSH 1.230 05/21/2016 1504    ASSESSMENT AND PLAN: LFT elevation  Vitamin D deficiency  Class 1 obesity with serious comorbidity and body mass index (BMI) of 32.0 to 32.9 in adult, unspecified obesity type  PLAN:  Vitamin D Deficiency Connie Clarke was informed that low vitamin D levels contributes to fatigue and are associated with obesity, breast, and colon cancer. She agrees to take OTC Vit D @5 ,000 IU every day and will follow up for routine testing of vitamin D, at least 2-3 times per year. She was informed of the risk of over-replacement of vitamin D and agrees to not increase her dose unless he discusses this with [name] first.  Elevated LFT We discussed the likely diagnosis of non alcoholic fatty liver disease today and how  this condition is obesity related. Connie Clarke was educated on her risk of developing NASH or even liver failure and the only proven treatment for NAFLD was weight loss. Bo agreed to continue with her weight loss efforts with healthier diet and exercise as an essential part of her treatment plan. We will request ultrasound, abdominal CT scan results, labs and hepatitis C results from PCP Darcus Austin. Connie Clarke agrees to follow up with our clinic in 3 weeks.  Obesity Connie Clarke is currently in the action stage of change. As such, her goal is to continue  with weight loss efforts She has agreed to keep Clarke food journal with 1200 to 1400 calories and 75+ grams of protein daily Connie Clarke has been instructed to work up to Clarke goal of 150 minutes of combined cardio and strengthening exercise per week for weight loss and overall health benefits. We discussed the following Behavioral Modification Strategies today: no skipping meals, increasing lean protein intake, decreasing simple carbohydrates and holiday eating strategies   Connie Clarke has agreed to follow up with our clinic in 3 weeks. She was informed of the importance of frequent follow up visits to maximize her success with intensive lifestyle modifications for her multiple health conditions.  I, Doreene Nest, am acting as transcriptionist for Dennard Nip, MD  I have reviewed the above documentation for accuracy and completeness, and I agree with the above. -Dennard Nip, MD   OBESITY BEHAVIORAL INTERVENTION VISIT  Today's visit was # 13 out of 22.  Starting weight: 201 lbs Starting date: 05/21/16 Today's weight : 186 lbs Today's date: 01/26/2017 Total lbs lost to date: 15 (Patients must lose 7 lbs in the first 6 months to continue with counseling)   ASK: We discussed the diagnosis of obesity with Kipp Laurence today and Ernesha agreed to give [name] permission to discuss obesity behavioral modification therapy today.  ASSESS: Ondine has the diagnosis of  obesity and her BMI today is 31.91 Citlally is in the action stage of change   ADVISE: Jamilla was educated on the multiple health risks of obesity as well as the benefit of weight loss to improve her health. She was advised of the need for long term treatment and the importance of lifestyle modifications.  AGREE: Multiple dietary modification options and treatment options were discussed and  Lissett agreed to keep Clarke food journal with 1200 to 1400 calories and 75+ grams of protein  We discussed the following Behavioral Modification Strategies today: no skipping meals,.vitd increasing lean protein intake, decreasing simple carbohydrates and holiday eating strategies

## 2017-02-09 ENCOUNTER — Ambulatory Visit (INDEPENDENT_AMBULATORY_CARE_PROVIDER_SITE_OTHER): Payer: 59 | Admitting: Neurology

## 2017-02-09 ENCOUNTER — Encounter: Payer: Self-pay | Admitting: Neurology

## 2017-02-09 ENCOUNTER — Encounter (INDEPENDENT_AMBULATORY_CARE_PROVIDER_SITE_OTHER): Payer: Self-pay

## 2017-02-09 VITALS — BP 142/87 | HR 89 | Ht 64.0 in | Wt 190.0 lb

## 2017-02-09 DIAGNOSIS — F32A Depression, unspecified: Secondary | ICD-10-CM

## 2017-02-09 DIAGNOSIS — F329 Major depressive disorder, single episode, unspecified: Secondary | ICD-10-CM | POA: Diagnosis not present

## 2017-02-09 DIAGNOSIS — F5101 Primary insomnia: Secondary | ICD-10-CM | POA: Diagnosis not present

## 2017-02-09 DIAGNOSIS — E663 Overweight: Secondary | ICD-10-CM | POA: Diagnosis not present

## 2017-02-09 NOTE — Progress Notes (Signed)
SLEEP MEDICINE CLINIC   Provider:  Larey Seat, M D  Primary Care Physician:  Darcus Austin, MD   Referring Provider: Dennard Nip, MD   Chief Complaint  Patient presents with  . New Patient (Initial Visit)    pt alone, room 11. healthy weight and wellness. while being there she found out that her metabolic rate is extremely low.  pt has been tired thru the day and the patient says that she sleeps alot if possible. she states she has mental fatigue.     HPI:  Connie Clarke is a 60 y.o. female , seen here as in a referral from Dr. Leafy Ro at [facility], for a sleep consultation,   I had the pleasure of seeing Connie Clarke today on 02/09/2017 upon referral by her medical weight management specialist Dr. Dennard Nip. Connie Clarke is a 61 year old right-handed, Caucasian female patient who has followed Dr. Migdalia Clarke weight loss plan. She is in the category to plan and is exercising 60 minutes 6 times per week. When she started with a medical weight management program her weight was about 201 pounds at a height of 5 foot 4. She was found to have elevated transaminases but these improved with weight loss and I attributed to nonalcoholic fatty liver disease. She has struggled with insomnia and daytime somnolence, she wakes frequently throughout the night. She has been snoring but has not been told that she has apnea. She is treated for vitamin D deficiency with supplements, and is insulin resistant, controlled by diet and exercise. She has let the lipidemia also diet controlled. I reviewed her current medication list she is on estradiol Vivelle-Dot 0.05 mg 24-hour patch, vitamin D 5000 units a day, lisinopril 10 mg tablets, fish oil, Prometrium progesterone 100 mg capsules daily, and she takes multiple vitamins.  She the diagnosis of hyperlipidemia hypertension, infertility, cardiac palpitations, arthropathy, vitamin D deficiency and obesity. She was treated for depression in the past an took Wellbutrin.   It gave her insomnia. Dr. Leafy Ro tested her metabolic rate by CO 2 re-breather mask test and found it low at 1300 "Connie Clarke" a day.   Chief complaint according to patient :  "I snore and I am fatigued, my sleep is not sound".  The last time she reported sound sleep was 2 years ago.   Sleep habits are as follows: Connie Clarke will take her dog for a short walk between 9:00 PM and 9.30 PM, she then may read or watch TV but outside of her bedroom and finally will retreat to the bedroom to sleep at about 10 PM. Her husband has very active fact sleep habits, she reports he is usually in bed by 10 and asleep. She may dream, but rarely remembers.  She would describe her bedroom is cool, quiet and dark, she usually sleeps on her side or prone. It is less difficult for her to go to sleep and to stay asleep. She reports waking up with shoulder pain, sometimes with the urge to go to the bathroom between 3 and 4 AM and some nights she cannot go back to sleep. She will often take naproxen or ibuprofen at that time and if her shoulder pain improves she may sleep very deep for the last 2 hours of the morning. She then has difficulty rising. She rarely feels restored and refreshed in the morning. She averages a sleep time of about 6-7 hours a night. She seldomly wakes up with headaches, but sometimes with a dry mouth. She reports  she cannot tolerate a supine sleep position.  Sleep medical history and family sleep history: Connie Clarke remembers having no difficulties falling asleep and staying asleep during childhood and young adulthood, no history of sleepwalking, night terrors, frequent nightmares or enuresis. She is not sure when she begun snoring. No parental  family history of sleep disorders, parents were diabetic. [name] Her sister has OSA and uses a CPAP>. Her daughters are healthy.   Social history: "my kids ( now 54 and 55 )  were telling me that I snored after any girl scout outing ", married. 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 The patient briefly smoked for less than 2 years in the late 70s during her college years. None since. Alcohol use: A couple of drinks per month. Caffeine use: She drinks coffee in the morning and ice tea to lunch. She does not drink sodas.  Reviewed labs. Dr. Migdalia Clarke Notes.     Review of Systems: Out of a complete 14 system review, the patient complains of only the following symptoms, and all other reviewed systems are negative. No RLS, no anemia. Snoring, possible apnea, dry mouth, and sleep quality.  Epworth score 14  , Fatigue severity score 46 , depression score n/a    Social History   Social History  . Marital status: Married    Spouse name: N/A  . Number of children: N/A  . Years of education: N/A   Occupational History  . Physical Therapist [employer]   Social History Main Topics  . Smoking status: Former Smoker    Quit date: 05/21/2014  . Smokeless tobacco: Never Used  . Alcohol use Not on file  . Drug use: Unknown  . Sexual activity: Not on file   Other Topics Concern  . Not on file   Social History Narrative  . No narrative on file    Family History  Problem Relation Age of Onset  . Stroke Mother   . Anxiety disorder Mother   . Obesity Mother   . Diabetes Father   . Hyperlipidemia Father   . Stroke Father   . Depression Father   . Obesity Father     Past Medical History:  Diagnosis Date  . Depression   . Hyperlipidemia   . Hypertension   . Infertility, female   . Joint pain   . Palpitations   . Vitamin D deficiency     Past Surgical History:  Procedure Laterality Date  . NO PAST SURGERIES      Current Outpatient Prescriptions  Medication Sig Dispense Refill  . Cholecalciferol ([facility]S VIT D 5000 HIGH-POTENCY) 5000 units capsule Take 1 capsule (5,000 Units total) by mouth daily.    [name] estradiol (VIVELLE-DOT) 0.05 MG/24HR patch Place 0.05 patches onto the skin 2 (two) times a week.  11  . lisinopril  (PRINIVIL,ZESTRIL) 10 MG tablet Take 10 mg by mouth every morning.  3  . Multiple Vitamins-Minerals (MULTIVITAMIN WITH MINERALS) tablet Take 1 tablet by mouth daily.    [name] omega-3 fish oil (MAXEPA) 1000 MG CAPS capsule Take 3 capsules (3,000 mg total) by mouth daily. 90 each   . progesterone (PROMETRIUM) 100 MG capsule Take 100 mg by mouth daily.     No current facility-administered medications for this visit.     Allergies as of 02/09/2017  . (No Known Allergies)    Vitals: BP (!) 142/87   Pulse 89   Ht 5\' 4"  (1.626 m)   Wt 190  lb (86.2 kg)   LMP 06/02/2005 (Approximate)   BMI 32.61 kg/m  Last Weight:  Wt Readings from Last 1 Encounters:  02/09/17 190 lb (86.2 kg)   UXN:ATFT mass index is 32.61 kg/m.     Last Height:   Ht Readings from Last 1 Encounters:  02/09/17 5\' 4"  (1.626 m)    Physical exam:  General: The patient is awake, alert and appears not in acute distress. The patient is well groomed. Head: Normocephalic, atraumatic. Neck is supple. Mallampati 5- invisible uvula hidden behind tongue.   neck circumference: 15.5 . Nasal airflow patent , TMJ click is  evident . Retrognathia is not seen.  Cardiovascular:  Regular rate and rhythm, without  murmurs or carotid bruit, and without distended neck veins. Respiratory: Lungs are clear to auscultation. Skin:  Without evidence of edema, or rash Trunk: BMI is 32.6.  Neurologic exam : The patient is awake and alert, oriented to place and time.    Attention span & concentration ability appears normal.  Speech is fluent,  without dysarthria, dysphonia or aphasia.  Mood and affect are appropriate.  Cranial nerves: Pupils are equal and briskly reactive to light.  Extraocular movements  in vertical and horizontal planes intact and without nystagmus. Visual fields by finger perimetry are intact. Hearing to finger rub intact.   Facial sensation intact to fine touch.  Facial motor strength is symmetric and tongue and uvula move  midline. Shoulder shrug was symmetrical.   Motor exam: Normal tone, muscle bulk and symmetric strength in all extremities. Sensory:  Fine touch, pinprick and vibration were tested in all extremities.  Coordination: Rapid alternating movements in the fingers/hands was normal. Finger-to-nose maneuver  normal without evidence of ataxia, dysmetria or tremor.  Gait and station: Patient walks without assistive device and is able unassisted to climb up to the exam table. Strength within normal limits.  Stance is stable and normal.   Deep tendon reflexes: in the  upper and lower extremities are symmetric and intact.     Assessment:  After physical and neurologic examination, review of laboratory studies,  Personal review of imaging studies, reports of other /same  Imaging studies, results of polysomnography and / or neurophysiology testing and pre-existing records as far as provided in visit., my assessment is   1)  Mrs. Browns's low metabolic rate was found to be the most probable cause for her stagnating weight loss. She has implemented a diligent exercise regimen, she adheres to a diet more than 70% of the time, she does not drink sodas. She still wonders why she can't lose weight in part may be related to her sleep architecture and sleep pattern. We will discuss some sleep hygiene baselines including advancing her bedtime so that she will get 7 hours of sleep per night, she has already implemented no TV and no reading in her bedroom and she describes her bedroom as conducive to sleep. I usually find progesterone at night helpful for postmenopausal women to sleep, estrogen is less important for sleep.  2) snoring has been witnessed for over 15 years ( girl scout outings) and her airway shape, her BMI and her neck size pose a higher risk of OSA. Age is also a risk factor, as is postmenopausal stage.   I will invite her for an attended sleep study at [place].  RV after sleep study .   The  patient was advised of the nature of the diagnosed disorder , the treatment options and the  risks  for general health and wellness arising from not treating the condition.   I spent more than 45 minutes of face to face time with the patient.  Greater than 50% of time was spent in counseling and coordination of care. We have discussed the diagnosis and differential and I answered the patient's questions.      Larey Seat, MD 8/64/8472, 0:72 AM  Certified in Neurology by ABPN Certified in [place] by [facility] [address] [place], [place] [address]

## 2017-02-09 NOTE — Progress Notes (Signed)
Can you please enter these results into EPIC? THX!

## 2017-02-09 NOTE — Progress Notes (Signed)
These are already in her chart and the ones that are not are more up to date. Connie Clarke, [place]

## 2017-02-09 NOTE — Patient Instructions (Signed)

## 2017-02-10 MED FILL — AMOXICILLIN 500 MG CAPSULE: 500 | 7 days supply | Qty: 21 | Fill #0

## 2017-02-16 ENCOUNTER — Ambulatory Visit (INDEPENDENT_AMBULATORY_CARE_PROVIDER_SITE_OTHER): Payer: 59 | Admitting: Family Medicine

## 2017-02-16 VITALS — BP 123/79 | HR 80 | Temp 97.9°F | Ht 64.0 in | Wt 185.0 lb

## 2017-02-16 DIAGNOSIS — E784 Other hyperlipidemia: Secondary | ICD-10-CM | POA: Diagnosis not present

## 2017-02-16 DIAGNOSIS — Z6831 Body mass index (BMI) 31.0-31.9, adult: Secondary | ICD-10-CM | POA: Diagnosis not present

## 2017-02-16 DIAGNOSIS — E669 Obesity, unspecified: Secondary | ICD-10-CM

## 2017-02-16 DIAGNOSIS — R5383 Other fatigue: Secondary | ICD-10-CM

## 2017-02-16 DIAGNOSIS — E7849 Other hyperlipidemia: Secondary | ICD-10-CM

## 2017-02-16 DIAGNOSIS — Z9189 Other specified personal risk factors, not elsewhere classified: Secondary | ICD-10-CM

## 2017-02-16 NOTE — Progress Notes (Signed)
Office: [phone]  /  Fax: [phone]   HPI:   Chief Complaint: OBESITY Connie Clarke is here to discuss her progress with her obesity treatment plan. She is on the Category 2 plan and is following her eating plan approximately 30 % of the time. She states she is walking and Yoga for exercise 45 minutes 5 times per week. Sharryn continues to do well with slow weight loss but she would like to look at other options besides the category 2 plan. Her weight is 185 lb (83.9 kg) today and has had a weight loss of 1 pound over a period of 3 weeks since her last visit. She has lost 16 lbs since starting treatment with [name].  Fatigue Kenia saw Dr. Brett Fairy and is getting scheduled for a sleep study. She still notes fatigue. She feels her energy is lower than it should be. This has worsened with weight gain and has not worsened recently.   Hyperlipidemia [name] has hyperlipidemia and has been trying to improve her cholesterol levels with intensive lifestyle modification including a low saturated fat diet, exercise and weight loss. Her LDL has improved from 160 to 139 with diet. She is currently taking fish oil. She denies any chest pain, claudication or myalgias.  At risk for cardiovascular disease Stuart is at a higher than average risk for cardiovascular disease due to obesity and hyperlipidemia. She currently denies any chest pain.  ALLERGIES: No Known Allergies  MEDICATIONS: Current Outpatient Prescriptions on File Prior to Visit  Medication Sig Dispense Refill  . Cholecalciferol ([employer] VIT D 5000 HIGH-POTENCY) 5000 units capsule Take 1 capsule (5,000 Units total) by mouth daily.    [name] estradiol (VIVELLE-DOT) 0.05 MG/24HR patch Place 0.05 patches onto the skin 2 (two) times a week.  11  . lisinopril (PRINIVIL,ZESTRIL) 10 MG tablet Take 10 mg by mouth every morning.  3  . Multiple Vitamins-Minerals (MULTIVITAMIN WITH MINERALS) tablet Take 1 tablet by mouth daily.    [name] omega-3 fish oil (MAXEPA)  1000 MG CAPS capsule Take 3 capsules (3,000 mg total) by mouth daily. 90 each   . progesterone (PROMETRIUM) 100 MG capsule Take 100 mg by mouth daily.     No current facility-administered medications on file prior to visit.     PAST MEDICAL HISTORY: Past Medical History:  Diagnosis Date  . Depression   . Hyperlipidemia   . Hypertension   . Infertility, female   . Joint pain   . Palpitations   . Vitamin D deficiency     PAST SURGICAL HISTORY: Past Surgical History:  Procedure Laterality Date  . NO PAST SURGERIES      SOCIAL HISTORY: Social History  Substance Use Topics  . Smoking status: Former Smoker    Quit date: 05/21/2014  . Smokeless tobacco: Never Used  . Alcohol use Not on file    FAMILY HISTORY: Family History  Problem Relation Age of Onset  . Stroke Mother   . Anxiety disorder Mother   . Obesity Mother   . Diabetes Father   . Hyperlipidemia Father   . Stroke Father   . Depression Father   . Obesity Father     ROS: Review of Systems  Constitutional: Positive for malaise/fatigue and weight loss.  Cardiovascular: Negative for chest pain and claudication.  Musculoskeletal: Negative for myalgias.    PHYSICAL EXAM: Blood pressure 123/79, pulse 80, temperature 97.9 F (36.6 C), height 5\' 4"  (1.626 m), weight 185 lb (83.9 kg), last menstrual period 06/02/2005, SpO2 97 %.  Body mass index is 31.76 kg/m. Physical Exam  Constitutional: She is oriented to person, place, and time. She appears well-developed and well-nourished.  Cardiovascular: Normal rate.   Pulmonary/Chest: Effort normal.  Musculoskeletal: Normal range of motion.  Neurological: She is oriented to person, place, and time.  Skin: Skin is warm and dry.  Psychiatric: She has a normal mood and affect. Her behavior is normal.  Vitals reviewed.   RECENT LABS AND TESTS: BMET    Component Value Date/Time   NA 139 01/05/2017 0839   K 4.5 01/05/2017 0839   CL 103 01/05/2017 0839   CO2 23  01/05/2017 0839   GLUCOSE 93 01/05/2017 0839   BUN 23 01/05/2017 0839   CREATININE 0.85 01/05/2017 0839   CALCIUM 9.5 01/05/2017 0839   GFRNONAA 74 01/05/2017 0839   GFRAA 86 01/05/2017 0839   Lab Results  Component Value Date   HGBA1C 5.3 01/05/2017   HGBA1C 5.3 08/27/2016   HGBA1C 5.4 05/21/2016   HGBA1C 5.4 09/10/2015   Lab Results  Component Value Date   INSULIN 11.6 01/05/2017   INSULIN 11.6 08/27/2016   INSULIN 13.5 05/21/2016   CBC    Component Value Date/Time   WBC 7.3 05/21/2016 1504   RBC 4.97 05/21/2016 1504   HGB 14.7 05/21/2016 1504   HCT 43.4 05/21/2016 1504   MCV 87 05/21/2016 1504   MCH 29.6 05/21/2016 1504   MCHC 33.9 05/21/2016 1504   RDW 13.1 05/21/2016 1504   LYMPHSABS 1.9 05/21/2016 1504   EOSABS 0.1 05/21/2016 1504   BASOSABS 0.0 05/21/2016 1504   Iron/TIBC/Ferritin/ %Sat No results found for: IRON, TIBC, FERRITIN, IRONPCTSAT Lipid Panel     Component Value Date/Time   CHOL 220 (H) 01/05/2017 0839   TRIG 126 01/05/2017 0839   HDL 56 01/05/2017 0839   LDLCALC 139 (H) 01/05/2017 0839   Hepatic Function Panel     Component Value Date/Time   PROT 6.6 01/05/2017 0839   ALBUMIN 4.3 01/05/2017 0839   AST 33 01/05/2017 0839   ALT 56 (H) 01/05/2017 0839   ALKPHOS 84 01/05/2017 0839   BILITOT 0.4 01/05/2017 0839      Component Value Date/Time   TSH 1.230 05/21/2016 1504   TSH 1.19 09/10/2015    ASSESSMENT AND PLAN: Other hyperlipidemia  Other fatigue  At risk for heart disease  Class 1 obesity with serious comorbidity and body mass index (BMI) of 31.0 to 31.9 in adult, unspecified obesity type  PLAN:  Fatigue Statia has agreed to continue with diet, exercise and weight loss efforts to help with fatigue and will follow with results. Proper sleep hygiene was discussed including the need for 7-8 hours of quality sleep each night.    Hyperlipidemia Gizell was informed of the American Heart Association Guidelines emphasizing  intensive lifestyle modifications as the first line treatment for hyperlipidemia. We discussed many lifestyle modifications today in depth, and Shiree will continue to work on decreasing saturated fats such as fatty red meat, butter and many fried foods. She will also increase vegetables and lean protein in her diet and continue to work on exercise and weight loss efforts.  Cardiovascular risk counseling Arrionna was given extended (15 minutes) coronary artery disease prevention counseling today. She is 61 y.o. female and has risk factors for heart disease including obesity and hyperlipidemia. We discussed intensive lifestyle modifications today with an emphasis on specific weight loss instructions and strategies. Pt was also informed of the importance of increasing exercise and decreasing saturated  fats to help prevent heart disease.  Obesity Kynnadi is currently in the action stage of change. As such, her goal is to continue with weight loss efforts She has agreed to change to follow a lower carbohydrate, vegetable and lean protein rich diet plan Lesly has been instructed to work up to a goal of 150 minutes of combined cardio and strengthening exercise per week for weight loss and overall health benefits. We discussed the following Behavioral Modification Strategies today: increasing lean protein intake and decreasing simple carbohydrates   Jacque has agreed to follow up with our clinic in 3 to 4 weeks. She was informed of the importance of frequent follow up visits to maximize her success with intensive lifestyle modifications for her multiple health conditions.  I, Doreene Nest, am acting as transcriptionist for Dennard Nip, MD  I have reviewed the above documentation for accuracy and completeness, and I agree with the above. -Dennard Nip, MD    OBESITY BEHAVIORAL INTERVENTION VISIT  Today's visit was # 13 out of 22.  Starting weight: 201 lbs Starting date: 05/21/16 Today's weight :  185 lbs Today's date: 02/16/2017 Total lbs lost to date: 16 (Patients must lose 7 lbs in the first 6 months to continue with counseling)   ASK: We discussed the diagnosis of obesity with Kipp Laurence today and Athena agreed to give [name] permission to discuss obesity behavioral modification therapy today.  ASSESS: Agam has the diagnosis of obesity and her BMI today is 31.74 Tanasha is in the action stage of change   ADVISE: Amaira was educated on the multiple health risks of obesity as well as the benefit of weight loss to improve her health. She was advised of the need for long term treatment and the importance of lifestyle modifications.  AGREE: Multiple dietary modification options and treatment options were discussed and  Asianae agreed to change to follow a lower carbohydrate, vegetable and lean protein rich diet plan We discussed the following Behavioral Modification Strategies today: increasing lean protein intake and decreasing simple carbohydrates

## 2017-02-17 ENCOUNTER — Encounter: Payer: Self-pay | Admitting: Neurology

## 2017-03-04 ENCOUNTER — Ambulatory Visit (INDEPENDENT_AMBULATORY_CARE_PROVIDER_SITE_OTHER): Payer: 59 | Admitting: Neurology

## 2017-03-04 DIAGNOSIS — F5101 Primary insomnia: Secondary | ICD-10-CM

## 2017-03-04 DIAGNOSIS — E663 Overweight: Secondary | ICD-10-CM

## 2017-03-04 DIAGNOSIS — F329 Major depressive disorder, single episode, unspecified: Secondary | ICD-10-CM

## 2017-03-04 DIAGNOSIS — G4733 Obstructive sleep apnea (adult) (pediatric): Secondary | ICD-10-CM

## 2017-03-04 DIAGNOSIS — F32A Depression, unspecified: Secondary | ICD-10-CM

## 2017-03-09 NOTE — Procedures (Signed)
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 Patient now in medical weight management program- for morbid obesity, diagnosis of hyperlipidemia, hypertension, depression. Feeling excessively daytime sleepy and fatigued.  The patient endorsed the Epworth Sleepiness Scale at 14/24 points, FSS 46 points.   The patient's weight 190 pounds with a height of 64 (inches), resulting in a BMI of 32.4 kg/m2. The patient's neck circumference measured 15 inches.  CURRENT MEDICATIONS: Estradiol, Lisinopril, Progesterone   PROCEDURE:  This is a multichannel digital polysomnogram utilizing the Somnostar 11.2 system.  Electrodes and sensors were applied and monitored per AASM Specifications.   EEG, EOG, Chin and Limb EMG, were sampled at 200 Hz.  ECG, Snore and Nasal Pressure, Thermal Airflow, Respiratory Effort, CPAP Flow and Pressure, Oximetry was sampled at 50 Hz. Digital video and audio were recorded.      BASELINE STUDY: Lights Out was at 22:30 and Lights On at 05:03.  Total recording time (TRT) was 393.5 minutes, with a total sleep time (TST) of 356 minutes.   The patient's sleep latency was 13.5 minutes.  REM latency was 162 minutes.  The sleep efficiency was 90.5 %.     SLEEP ARCHITECTURE: WASO (Wake after sleep onset) was 31 minutes.  There were 13 minutes in Stage N1, 103 minutes Stage N2, 164 minutes Stage N3 and 76 minutes in Stage REM.  The percentage of Stage N1 was 3.7%, Stage N2 was 28.9%, Stage N3 was 46.1% and Stage R (REM sleep) was 21.3%.   RESPIRATORY ANALYSIS:  There were a total of 43 respiratory events:  2 obstructive apneas, 0 central apneas and 41 hypopneas with 2 respiratory event related arousals (RERAs).  The total APNEA/HYPOPNEA INDEX (AHI) was 7.2/hour and the  total RESPIRATORY DISTURBANCE INDEX was 7.6 /hour.  21 events occurred in REM sleep and 40 events in NREM. The REM AHI was 16.6 /hour, versus a non-REM AHI of 4.7. The patient spent 6 minutes of total sleep time in the supine position and 350 minutes in non-supine. The supine AHI was 70.0 versus a non-supine AHI of 6.2.  OXYGEN SATURATION & C02: The Wake baseline 02 saturation was 95%, with the lowest being 83%. Time spent below 89% saturation equaled 9 minutes.   PERIODIC LIMB MOVEMENTS:  The patient had a total of 0 Periodic Limb Movements.  The arousals were noted as: 43 were spontaneous, 0 were associated with PLMs, and 21 were associated with respiratory events. Audio and video analysis did not show any abnormal or unusual movements, behaviors, phonations or vocalizations.  The patient took one bathroom break. Loud Snoring was noted. EKG was in keeping with normal sinus rhythm (NSR).   IMPRESSION: Very Mild Obstructive Sleep Apnea (OSA) at AHI 7.2 with strongest accentuation during supine sleep- to an AHI of 70/hr. Not associated with hypoxemia.  1. Primary Snoring, worse in supine and during REM sleep.   RECOMMENDATIONS:  1. Positional therapy is advised and avoiding the supine sleep position is first treatment. We can meet and discuss the Tennis ball method. 2. Avoid sedative-hypnotics which may worsen sleep apnea (as applicable). 3. Continue to lose weight, by diet and exercise if not contraindicated (BMI 32.4). 4. Further information regarding OSA  may be obtained from [employer] (www.sleepfoundation.org) or American Sleep Apnea Association (www.sleepapnea.org). 5. A follow up appointment can be scheduled in the Sleep Clinic at [facility]. The referring provider will be notified of the results.  I would not treat this mild apnea and loud snoring with CPAP, but recommend to consider a dental device.     I certify that I have reviewed the entire raw data  recording prior to the issuance of this report in accordance with the Standards of Accreditation of the American Academy of Sleep Medicine (AASM)    Larey Seat, MD     03-09-2017  Diplomat, American Board of Psychiatry and Neurology  Diplomat, American Board of [place] Director, [facility] [employer]

## 2017-03-11 ENCOUNTER — Telehealth: Payer: Self-pay | Admitting: Neurology

## 2017-03-11 NOTE — Telephone Encounter (Signed)
-----   Message from Larey Seat, MD sent at 03/09/2017  8:46 AM EDT ----- Patient's insomnia seems only marginally related to snoring and apnea. Apnea was mild and supine position dependent. I recommend to avoid supine sleep and if snoring persists to use a dental device - we can make this referral.CPAP would be overkill. Chronic Insomnia is best treated by cognitive behaviour therapy.

## 2017-03-11 NOTE — Telephone Encounter (Signed)
Called patient to discuss sleep study results. No answer at this time. LVM for patient to call back

## 2017-03-12 NOTE — Telephone Encounter (Signed)
Pt returned RN's call °

## 2017-03-16 NOTE — Telephone Encounter (Signed)
I called pt back, no answer. LVM for patient to call back to discuss sleep study results.

## 2017-03-17 NOTE — Telephone Encounter (Signed)
Pt returned call and I was able to discuss the sleep study results. I spoke with her about the tennis ball technique, and keeping a pillow behind her to help with training her to sleep on her side. I encouraged weight loss and told the patient to avoid any sedative and sleep hypnotics as this will worsen the sleep apnea. A follow up apt was offered to the patient and declined at this time and patient stated that she would contact us later if she feels like the tips we provided were not helpful

## 2017-03-18 ENCOUNTER — Ambulatory Visit (INDEPENDENT_AMBULATORY_CARE_PROVIDER_SITE_OTHER): Payer: 59 | Admitting: Family Medicine

## 2017-04-01 ENCOUNTER — Ambulatory Visit (INDEPENDENT_AMBULATORY_CARE_PROVIDER_SITE_OTHER): Payer: 59 | Admitting: Family Medicine

## 2017-04-01 VITALS — BP 145/78 | HR 76 | Temp 97.9°F | Ht 64.0 in | Wt 185.0 lb

## 2017-04-01 DIAGNOSIS — Z6831 Body mass index (BMI) 31.0-31.9, adult: Secondary | ICD-10-CM | POA: Diagnosis not present

## 2017-04-01 DIAGNOSIS — E669 Obesity, unspecified: Secondary | ICD-10-CM

## 2017-04-01 DIAGNOSIS — E8881 Metabolic syndrome: Secondary | ICD-10-CM

## 2017-04-01 NOTE — Progress Notes (Signed)
Office: [phone]  /  Fax: [phone]   HPI:   Chief Complaint: OBESITY Connie Clarke is here to discuss her progress with her obesity treatment plan. She is on the follow a lower carbohydrate, vegetable and lean protein rich diet plan and is following her eating plan approximately 0 % of the time. She states she is walking, pilates, and yoga for 60 minutes 5 times per week. Connie Clarke is not following any plan right now, she states she is eating more mindfully and have been able to maintain her weight. She is doing better with resisting temptations. She would like to try an intermittent fasting plan next.  Her weight is 185 lb (83.9 kg) today and has not lost weight since her last visit. She has lost 16 lbs since starting treatment with [name].  Insulin Resistance Connie Clarke has a diagnosis of insulin resistance based on her elevated fasting insulin level >5.  She is attempting to diet control by decreasing simple carbohydrates in her diet. Although Connie Clarke blood glucose readings are still under good control, insulin resistance puts her at greater risk of metabolic syndrome and diabetes. She is not taking metformin currently and continues to work on diet and exercise to decrease risk of diabetes. She denies nausea, vomiting, or hypoglycemia.  ALLERGIES: No Known Allergies  MEDICATIONS: Current Outpatient Prescriptions on File Prior to Visit  Medication Sig Dispense Refill  . Cholecalciferol ([employer] VIT D 5000 HIGH-POTENCY) 5000 units capsule Take 1 capsule (5,000 Units total) by mouth daily.    [name] estradiol (VIVELLE-DOT) 0.05 MG/24HR patch Place 0.05 patches onto the skin 2 (two) times a week.  11  . lisinopril (PRINIVIL,ZESTRIL) 10 MG tablet Take 10 mg by mouth every morning.  3  . Multiple Vitamins-Minerals (MULTIVITAMIN WITH MINERALS) tablet Take 1 tablet by mouth daily.    [name] omega-3 fish oil (MAXEPA) 1000 MG CAPS capsule Take 3 capsules (3,000 mg total) by mouth daily. 90 each   . progesterone  (PROMETRIUM) 100 MG capsule Take 100 mg by mouth daily.     No current facility-administered medications on file prior to visit.     PAST MEDICAL HISTORY: Past Medical History:  Diagnosis Date  . Depression   . Hyperlipidemia   . Hypertension   . Infertility, female   . Joint pain   . Palpitations   . Vitamin D deficiency     PAST SURGICAL HISTORY: Past Surgical History:  Procedure Laterality Date  . NO PAST SURGERIES      SOCIAL HISTORY: Social History  Substance Use Topics  . Smoking status: Former Smoker    Quit date: 05/21/2014  . Smokeless tobacco: Never Used  . Alcohol use Not on file    FAMILY HISTORY: Family History  Problem Relation Age of Onset  . Stroke Mother   . Anxiety disorder Mother   . Obesity Mother   . Diabetes Father   . Hyperlipidemia Father   . Stroke Father   . Depression Father   . Obesity Father     ROS: Review of Systems  Constitutional: Negative for weight loss.  Gastrointestinal: Negative for nausea and vomiting.  Endo/Heme/Allergies:       Negative hypoglycemia    PHYSICAL EXAM: Blood pressure (!) 145/78, pulse 76, temperature 97.9 F (36.6 C), temperature source Oral, height 5\' 4"  (1.626 m), weight 185 lb (83.9 kg), last menstrual period 06/02/2005, SpO2 97 %. Body mass index is 31.76 kg/m. Physical Exam  Constitutional: She is oriented to person, place, and time. She  appears well-developed and well-nourished.  Cardiovascular: Normal rate.   Pulmonary/Chest: Effort normal.  Musculoskeletal: Normal range of motion.  Neurological: She is oriented to person, place, and time.  Skin: Skin is warm and dry.  Psychiatric: She has a normal mood and affect. Her behavior is normal.  Vitals reviewed.   RECENT LABS AND TESTS: BMET    Component Value Date/Time   NA 139 01/05/2017 0839   K 4.5 01/05/2017 0839   CL 103 01/05/2017 0839   CO2 23 01/05/2017 0839   GLUCOSE 93 01/05/2017 0839   BUN 23 01/05/2017 0839   CREATININE  0.85 01/05/2017 0839   CALCIUM 9.5 01/05/2017 0839   GFRNONAA 74 01/05/2017 0839   GFRAA 86 01/05/2017 0839   Lab Results  Component Value Date   HGBA1C 5.3 01/05/2017   HGBA1C 5.3 08/27/2016   HGBA1C 5.4 05/21/2016   HGBA1C 5.4 09/10/2015   Lab Results  Component Value Date   INSULIN 11.6 01/05/2017   INSULIN 11.6 08/27/2016   INSULIN 13.5 05/21/2016   CBC    Component Value Date/Time   WBC 7.3 05/21/2016 1504   RBC 4.97 05/21/2016 1504   HGB 14.7 05/21/2016 1504   HCT 43.4 05/21/2016 1504   MCV 87 05/21/2016 1504   MCH 29.6 05/21/2016 1504   MCHC 33.9 05/21/2016 1504   RDW 13.1 05/21/2016 1504   LYMPHSABS 1.9 05/21/2016 1504   EOSABS 0.1 05/21/2016 1504   BASOSABS 0.0 05/21/2016 1504   Iron/TIBC/Ferritin/ %Sat No results found for: IRON, TIBC, FERRITIN, IRONPCTSAT Lipid Panel     Component Value Date/Time   CHOL 220 (H) 01/05/2017 0839   TRIG 126 01/05/2017 0839   HDL 56 01/05/2017 0839   LDLCALC 139 (H) 01/05/2017 0839   Hepatic Function Panel     Component Value Date/Time   PROT 6.6 01/05/2017 0839   ALBUMIN 4.3 01/05/2017 0839   AST 33 01/05/2017 0839   ALT 56 (H) 01/05/2017 0839   ALKPHOS 84 01/05/2017 0839   BILITOT 0.4 01/05/2017 0839      Component Value Date/Time   TSH 1.230 05/21/2016 1504   TSH 1.19 09/10/2015    ASSESSMENT AND PLAN: Insulin resistance  Class 1 obesity with serious comorbidity and body mass index (BMI) of 31.0 to 31.9 in adult, unspecified obesity type  PLAN:  Insulin Resistance Connie Clarke will continue to work on weight loss, diet, exercise, and decreasing simple carbohydrates in her diet to help decrease the risk of diabetes. We dicussed metformin including benefits and risks. She was informed that eating too many simple carbohydrates or too many calories at one sitting increases the likelihood of GI side effects. Imo declined metformin for now and prescription was not written today. We will recheck labs in 6 weeks and  Connie Clarke agrees to follow up with our clinic in 6 to 8 weeks as directed to monitor her progress.  We spent > than 50% of the 15 minute visit on the counseling as documented in the note.  Obesity Connie Clarke is currently in the action stage of change. As such, her goal is to continue with weight loss efforts She has agreed to change to follow the Intermittent fasting plan, fasting for 14 hours and 10 hours of mindful eating Connie Clarke has been instructed to work up to a goal of 150 minutes of combined cardio and strengthening exercise per week for weight loss and overall health benefits. We discussed the following Behavioral Modification Strategies today: increasing lean protein intake, decreasing simple carbohydrates, increasing  vegetables, decrease eating out, avoiding temptations, and planning for success   Connie Clarke has agreed to follow up with our clinic in 6 to 8 weeks. She was informed of the importance of frequent follow up visits to maximize her success with intensive lifestyle modifications for her multiple health conditions.  I, Trixie Dredge, am acting as transcriptionist for Dennard Nip, MD  I have reviewed the above documentation for accuracy and completeness, and I agree with the above. -Dennard Nip, MD      Today's visit was # 15 out of 22.  Starting weight: 201 lbs Starting date: 05/21/16 Today's weight : 185 lbs  Today's date: 04/01/2017 Total lbs lost to date: 16 (Patients must lose 7 lbs in the first 6 months to continue with counseling)   ASK: We discussed the diagnosis of obesity with Connie Clarke today and Connie Clarke agreed to give [name] permission to discuss obesity behavioral modification therapy today.  ASSESS: Connie Clarke has the diagnosis of obesity and her BMI today is 31.74 Connie Clarke is in the action stage of change   ADVISE: Connie Clarke was educated on the multiple health risks of obesity as well as the benefit of weight loss to improve her health. She was advised of the  need for long term treatment and the importance of lifestyle modifications.  AGREE: Multiple dietary modification options and treatment options were discussed and  Connie Clarke agreed to follow the Intermittent fasting plan, fasting for 14 hours and 10 hours of mindful eating We discussed the following Behavioral Modification Strategies today: increasing lean protein intake, decreasing simple carbohydrates, increasing vegetables, decrease eating out, avoiding temptations, and planning for success

## 2017-04-03 MED FILL — LISINOPRIL 10 MG TABS: 10 | 90 days supply | Qty: 90 | Fill #2

## 2017-04-03 MED FILL — ESTRADIOL 0.05 MG PATCH: 0.05 | 84 days supply | Qty: 24 | Fill #4

## 2017-05-11 ENCOUNTER — Ambulatory Visit (INDEPENDENT_AMBULATORY_CARE_PROVIDER_SITE_OTHER): Payer: 59 | Admitting: Family Medicine

## 2017-05-14 ENCOUNTER — Ambulatory Visit (INDEPENDENT_AMBULATORY_CARE_PROVIDER_SITE_OTHER): Payer: 59 | Admitting: Family Medicine

## 2017-05-14 VITALS — BP 144/75 | HR 79 | Temp 98.1°F | Ht 64.0 in | Wt 188.0 lb

## 2017-05-14 DIAGNOSIS — I10 Essential (primary) hypertension: Secondary | ICD-10-CM | POA: Diagnosis not present

## 2017-05-14 DIAGNOSIS — E559 Vitamin D deficiency, unspecified: Secondary | ICD-10-CM

## 2017-05-14 DIAGNOSIS — Z9189 Other specified personal risk factors, not elsewhere classified: Secondary | ICD-10-CM

## 2017-05-14 DIAGNOSIS — E8881 Metabolic syndrome: Secondary | ICD-10-CM

## 2017-05-14 DIAGNOSIS — Z6832 Body mass index (BMI) 32.0-32.9, adult: Secondary | ICD-10-CM | POA: Diagnosis not present

## 2017-05-14 DIAGNOSIS — E7849 Other hyperlipidemia: Secondary | ICD-10-CM | POA: Diagnosis not present

## 2017-05-14 DIAGNOSIS — E669 Obesity, unspecified: Secondary | ICD-10-CM

## 2017-05-14 DIAGNOSIS — R945 Abnormal results of liver function studies: Secondary | ICD-10-CM | POA: Diagnosis not present

## 2017-05-14 DIAGNOSIS — R7989 Other specified abnormal findings of blood chemistry: Secondary | ICD-10-CM | POA: Insufficient documentation

## 2017-05-14 NOTE — Progress Notes (Signed)
Office: [phone]  /  Fax: [phone]   HPI:   Chief Complaint: OBESITY Connie Clarke is here to discuss her progress with her obesity treatment plan. She is on the Intermittent fasting plan, fasting for 14 hours and 10 hours of mindful eating and is following her eating plan approximately 10 % of the time. She states she is walking and pilates for 60 minutes 5 times per week. Connie Clarke has been off track in the last 4 weeks, she struggles with meal planning and increased temptations. She has a lot of holiday celebrations coming up and she is worried about continued weight gain.  Her weight is 188 lb (85.3 kg) today and has gained 3 pounds since her last visit. She has lost 13 lbs since starting treatment with [name].  Elevated LFT Connie Clarke has a diagnosis of elevated ALT. Her BMI is over 40. She is attempting to diet control and she denies abdominal pain or jaundice and has never been told of any liver problems in the past. She denies excessive alcohol intake.  Insulin Resistance Connie Clarke has a diagnosis of insulin resistance based on her elevated fasting insulin level >5. She is attempting to diet control, but she has been struggling with this recently. She is due for labs and she denies hypoglycemia. Although Connie Clarke's blood glucose readings are still under good control, insulin resistance puts her at greater risk of metabolic syndrome and diabetes. She is not taking metformin currently and continues to work on diet and exercise to decrease risk of diabetes.  Hyperlipidemia Connie Clarke has hyperlipidemia and she is attempting to diet control and improve cholesterol levels with intensive lifestyle modification including a low saturated fat diet, exercise and weight loss. She is due for labs and she denies any chest pain, claudication or myalgias.  Vitamin D deficiency Connie Clarke has a diagnosis of vitamin D deficiency. She is on OTC Vit D 5,000 UI daily and her last level was at goal. She denies nausea, vomiting  or muscle weakness.  Hypertension Connie Clarke is a 61 y.o. female with hypertension. Connie Clarke is on lisinopril but she didn't take it this morning and her blood pressure is slightly elevated. denies chest pain or headache. She is working weight loss to help control her blood pressure with the goal of decreasing her risk of heart attack and stroke. Connie Clarke's blood pressure is not currently controlled.  At risk for cardiovascular disease Connie Clarke is at a higher than average risk for cardiovascular disease due to obesity and hypertension. She currently denies any chest pain.  ALLERGIES: No Known Allergies  MEDICATIONS: Current Outpatient Medications on File Prior to Visit  Medication Sig Dispense Refill  . Cholecalciferol ([employer] VIT D 5000 HIGH-POTENCY) 5000 units capsule Take 1 capsule (5,000 Units total) by mouth daily.    [name] estradiol (VIVELLE-DOT) 0.05 MG/24HR patch Place 0.05 patches onto the skin 2 (two) times a week.  11  . lisinopril (PRINIVIL,ZESTRIL) 10 MG tablet Take 10 mg by mouth every morning.  3  . Multiple Vitamins-Minerals (MULTIVITAMIN WITH MINERALS) tablet Take 1 tablet by mouth daily.    [name] omega-3 fish oil (MAXEPA) 1000 MG CAPS capsule Take 3 capsules (3,000 mg total) by mouth daily. 90 each   . progesterone (PROMETRIUM) 100 MG capsule Take 100 mg by mouth daily.     No current facility-administered medications on file prior to visit.     PAST MEDICAL HISTORY: Past Medical History:  Diagnosis Date  . Depression   . Hyperlipidemia   . Hypertension   .  Infertility, female   . Joint pain   . Palpitations   . Vitamin D deficiency     PAST SURGICAL HISTORY: Past Surgical History:  Procedure Laterality Date  . NO PAST SURGERIES      SOCIAL HISTORY: Social History   Tobacco Use  . Smoking status: Former Smoker    Last attempt to quit: 05/21/2014    Years since quitting: 2.9  . Smokeless tobacco: Never Used  Substance Use Topics  . Alcohol use: Not on file  .  Drug use: Not on file    FAMILY HISTORY: Family History  Problem Relation Age of Onset  . Stroke Mother   . Anxiety disorder Mother   . Obesity Mother   . Diabetes Father   . Hyperlipidemia Father   . Stroke Father   . Depression Father   . Obesity Father     ROS: Review of Systems  Constitutional: Negative for weight loss.  Eyes:       Negative Jaundice  Cardiovascular: Negative for chest pain and claudication.  Gastrointestinal: Negative for abdominal pain, nausea and vomiting.  Musculoskeletal: Negative for myalgias.       Negative muscle weakness  Neurological: Negative for headaches.  Endo/Heme/Allergies:       Negative hypoglycemia    PHYSICAL EXAM: Blood pressure (!) 144/75, pulse 79, temperature 98.1 F (36.7 C), temperature source Oral, height 5\' 4"  (1.626 m), weight 188 lb (85.3 kg), last menstrual period 06/02/2005, SpO2 100 %. Body mass index is 32.27 kg/m. Physical Exam  Constitutional: She is oriented to person, place, and time. She appears well-developed and well-nourished.  Cardiovascular: Normal rate.  Pulmonary/Chest: Effort normal.  Musculoskeletal: Normal range of motion.  Neurological: She is oriented to person, place, and time.  Skin: Skin is warm and dry.  Psychiatric: She has a normal mood and affect. Her behavior is normal.  Vitals reviewed.   RECENT LABS AND TESTS: BMET    Component Value Date/Time   NA 139 01/05/2017 0839   K 4.5 01/05/2017 0839   CL 103 01/05/2017 0839   CO2 23 01/05/2017 0839   GLUCOSE 93 01/05/2017 0839   BUN 23 01/05/2017 0839   CREATININE 0.85 01/05/2017 0839   CALCIUM 9.5 01/05/2017 0839   GFRNONAA 74 01/05/2017 0839   GFRAA 86 01/05/2017 0839   Lab Results  Component Value Date   HGBA1C 5.3 01/05/2017   HGBA1C 5.3 08/27/2016   HGBA1C 5.4 05/21/2016   HGBA1C 5.4 09/10/2015   Lab Results  Component Value Date   INSULIN 11.6 01/05/2017   INSULIN 11.6 08/27/2016   INSULIN 13.5 05/21/2016   CBC      Component Value Date/Time   WBC 7.3 05/21/2016 1504   RBC 4.97 05/21/2016 1504   HGB 14.7 05/21/2016 1504   HCT 43.4 05/21/2016 1504   MCV 87 05/21/2016 1504   MCH 29.6 05/21/2016 1504   MCHC 33.9 05/21/2016 1504   RDW 13.1 05/21/2016 1504   LYMPHSABS 1.9 05/21/2016 1504   EOSABS 0.1 05/21/2016 1504   BASOSABS 0.0 05/21/2016 1504   Iron/TIBC/Ferritin/ %Sat No results found for: IRON, TIBC, FERRITIN, IRONPCTSAT Lipid Panel     Component Value Date/Time   CHOL 220 (H) 01/05/2017 0839   TRIG 126 01/05/2017 0839   HDL 56 01/05/2017 0839   LDLCALC 139 (H) 01/05/2017 0839   Hepatic Function Panel     Component Value Date/Time   PROT 6.6 01/05/2017 0839   ALBUMIN 4.3 01/05/2017 0839   AST 33  01/05/2017 0839   ALT 56 (H) 01/05/2017 0839   ALKPHOS 84 01/05/2017 0839   BILITOT 0.4 01/05/2017 0839      Component Value Date/Time   TSH 1.230 05/21/2016 1504   TSH 1.19 09/10/2015    ASSESSMENT AND PLAN: Elevated LFTs  Insulin resistance - Plan: Comprehensive metabolic panel, Hemoglobin A1c, Insulin, random  Other hyperlipidemia - Plan: Lipid Panel With LDL/HDL Ratio  Vitamin D deficiency - Plan: VITAMIN D 25 Hydroxy (Vit-D Deficiency, Fractures)  Essential hypertension  At risk for heart disease  Class 1 obesity with serious comorbidity and body mass index (BMI) of 32.0 to 32.9 in adult, unspecified obesity type  PLAN:  Elevated LFT We discussed the likely diagnosis of non alcoholic fatty liver disease today and how this condition is obesity related. Kina was educated on her risk of developing NASH or even liver failure and th only proven treatment for NAFLD was weight loss. Kory agrees to continue with her weight loss efforts with healthier diet and exercise as an essential part of her treatment plan. We will check labs and Taraya agrees to follow up with our clinic in 3 weeks.  Insulin Resistance Corynne will continue to work on weight loss, exercise, and  decreasing simple carbohydrates in her diet to help decrease the risk of diabetes. We dicussed metformin including benefits and risks. She was informed that eating too many simple carbohydrates or too many calories at one sitting increases the likelihood of GI side effects. Reham declined metformin for now and prescription was not written today. We will check labs and Tylor agrees to follow up with our clinic in 3 weeks as directed to monitor her progress.  Hyperlipidemia Cataleyah was informed of the American Heart Association Guidelines emphasizing intensive lifestyle modifications as the first line treatment for hyperlipidemia. We discussed many lifestyle modifications today in depth, and Kwanza will continue to work on decreasing saturated fats such as fatty red meat, butter and many fried foods. She will also increase vegetables and lean protein in her diet and continue to work on diet, exercise and weight loss efforts. We will check labs and Amulya agrees to follow up with our clinic in 3 weeks.  Vitamin D Deficiency Latacha was informed that low vitamin D levels contributes to fatigue and are associated with obesity, breast, and colon cancer. She agrees to continue taking OTC Vit D 5,000 UI daily and will follow up for routine testing of vitamin D, at least 2-3 times per year. She was informed of the risk of over-replacement of vitamin D and agrees to not increase her dose unless he discusses this with [name] first. We will check labs and Emersen agrees to follow up with our clinic in 3 weeks.  Hypertension We discussed sodium restriction, working on diet, healthy weight loss, and a regular exercise program as the means to achieve improved blood pressure control. Birdell agreed with this plan and agreed to follow up as directed. We will continue to monitor her blood pressure as well as her progress with the above lifestyle modifications. She will continue her medications as prescribed and will watch for  signs of hypotension as she continues her lifestyle modifications. We will check labs and Melvin agrees to follow up with our clinic in 3 weeks.  Cardiovascular risk counselling Haifa was given extended (15 minutes) coronary artery disease prevention counseling today. She is 60 y.o. female and has risk factors for heart disease including obesity and hypertension. We discussed intensive lifestyle modifications today with  an emphasis on specific weight loss instructions and strategies. Pt was also informed of the importance of increasing exercise and decreasing saturated fats to help prevent heart disease.  Obesity Halle is currently in the action stage of change. As such, her goal is to continue with weight loss efforts She has agreed to portion control better and make smarter food choices, such as increase vegetables and decrease simple carbohydrates  Rayni has been instructed to work up to a goal of 150 minutes of combined cardio and strengthening exercise per week for weight loss and overall health benefits. We discussed the following Behavioral Modification Strategies today: increasing lean protein intake, holiday eating strategies, and travel eating strategies    Quentina has agreed to follow up with our clinic in 3 weeks. She was informed of the importance of frequent follow up visits to maximize her success with intensive lifestyle modifications for her multiple health conditions.  I, Trixie Dredge, am acting as transcriptionist for Dennard Nip, MD  I have reviewed the above documentation for accuracy and completeness, and I agree with the above. -Dennard Nip, MD     Today's visit was # 16 out of 22.  Starting weight: 201 lbs Starting date: 05/21/16 Today's weight : 188 lbs  Today's date: 05/14/2017 Total lbs lost to date: 13 (Patients must lose 7 lbs in the first 6 months to continue with counseling)   ASK: We discussed the diagnosis of obesity with Kipp Laurence today  and Paulina agreed to give [name] permission to discuss obesity behavioral modification therapy today.  ASSESS: Jalexia has the diagnosis of obesity and her BMI today is 32.25 Marvetta is in the action stage of change   ADVISE: Traci was educated on the multiple health risks of obesity as well as the benefit of weight loss to improve her health. She was advised of the need for long term treatment and the importance of lifestyle modifications.  AGREE: Multiple dietary modification options and treatment options were discussed and  Tonie agreed to portion control better and make smarter food choices, such as increase vegetables and decrease simple carbohydrates  We discussed the following Behavioral Modification Strategies today: increasing lean protein intake, holiday eating strategies, travel eating strategies

## 2017-05-18 ENCOUNTER — Ambulatory Visit (INDEPENDENT_AMBULATORY_CARE_PROVIDER_SITE_OTHER): Payer: 59 | Admitting: Family Medicine

## 2017-06-02 DIAGNOSIS — C50919 Malignant neoplasm of unspecified site of unspecified female breast: Secondary | ICD-10-CM

## 2017-06-02 DIAGNOSIS — Z923 Personal history of irradiation: Secondary | ICD-10-CM

## 2017-06-02 HISTORY — DX: Personal history of irradiation: Z92.3

## 2017-06-02 HISTORY — DX: Malignant neoplasm of unspecified site of unspecified female breast: C50.919

## 2017-06-08 ENCOUNTER — Ambulatory Visit (INDEPENDENT_AMBULATORY_CARE_PROVIDER_SITE_OTHER): Payer: 59 | Admitting: Family Medicine

## 2017-06-22 ENCOUNTER — Ambulatory Visit (INDEPENDENT_AMBULATORY_CARE_PROVIDER_SITE_OTHER): Payer: 59 | Admitting: Family Medicine

## 2017-06-22 VITALS — BP 154/89 | HR 85 | Temp 97.8°F | Ht 64.0 in | Wt 189.0 lb

## 2017-06-22 DIAGNOSIS — Z6832 Body mass index (BMI) 32.0-32.9, adult: Secondary | ICD-10-CM | POA: Diagnosis not present

## 2017-06-22 DIAGNOSIS — I10 Essential (primary) hypertension: Secondary | ICD-10-CM

## 2017-06-22 DIAGNOSIS — E7849 Other hyperlipidemia: Secondary | ICD-10-CM | POA: Diagnosis not present

## 2017-06-22 DIAGNOSIS — R7989 Other specified abnormal findings of blood chemistry: Secondary | ICD-10-CM

## 2017-06-22 DIAGNOSIS — R945 Abnormal results of liver function studies: Secondary | ICD-10-CM | POA: Diagnosis not present

## 2017-06-22 DIAGNOSIS — E669 Obesity, unspecified: Secondary | ICD-10-CM

## 2017-06-22 DIAGNOSIS — E8881 Metabolic syndrome: Secondary | ICD-10-CM

## 2017-06-22 DIAGNOSIS — E559 Vitamin D deficiency, unspecified: Secondary | ICD-10-CM | POA: Diagnosis not present

## 2017-06-22 NOTE — Progress Notes (Signed)
Office: [phone]  /  Fax: [phone]   HPI:   Chief Complaint: OBESITY Connie Clarke is here to discuss her progress with her obesity treatment plan. She is on the portion control better and make smarter food choices, such as increase vegetables and decrease simple carbohydrates  and is following her eating plan approximately 25 % of the time. She states she is walking for 40 minutes 4 times per week. Connie Clarke's last visit was over 4 weeks ago. She says holidays she indulged in everything. She was not mindful of sweets and portion control. She was on the portion control and smarter food choice plan. She is considering meal planning service because she doesn't like meal prep.  Her weight is 189 lb (85.7 kg) today and has gained 1 pound since her last visit. She has lost 12 lbs since starting treatment with Connie Clarke.  Elevated LFT Connie Clarke has a diagnosis of elevated ALT. Her BMI is over 40. She denies right upper quadrant pain, jaundice, or intake to fatty meals and has never been told of any liver problems in the past. She denies excessive alcohol intake.  Hypertension Connie Clarke is a 62 y.o. female with hypertension. Connie Clarke may have forgotten to take her medications yesterday and she denies chest pain, palpitations, or shortness of breath. She is working weight loss to help control her blood pressure with the goal of decreasing her risk of heart attack and stroke. Connie Clarke's blood pressure is not currently controlled.  Insulin Resistance Connie Clarke has a diagnosis of insulin resistance based on her elevated fasting insulin level >5. Although Connie Clarke's blood glucose readings are still under good control, insulin resistance puts her at greater risk of metabolic syndrome and diabetes. She is not taking metformin currently and continues to work on diet and exercise to decrease risk of diabetes. She notes polyphagia and denies hypoglycemia episodes.  ALLERGIES: No Known Allergies  MEDICATIONS: Current  Outpatient Medications on File Prior to Visit  Medication Sig Dispense Refill  . Cholecalciferol ([employer]S VIT D 5000 HIGH-POTENCY) 5000 units capsule Take 1 capsule (5,000 Units total) by mouth daily.    Connie Clarke lisinopril (PRINIVIL,ZESTRIL) 10 MG tablet Take 10 mg by mouth every morning.  3  . Multiple Vitamins-Minerals (MULTIVITAMIN WITH MINERALS) tablet Take 1 tablet by mouth daily.    Connie Clarke omega-3 fish oil (MAXEPA) 1000 MG CAPS capsule Take 3 capsules (3,000 mg total) by mouth daily. 90 each    No current facility-administered medications on file prior to visit.     PAST MEDICAL HISTORY: Past Medical History:  Diagnosis Date  . Depression   . Hyperlipidemia   . Hypertension   . Infertility, female   . Joint pain   . Palpitations   . Vitamin D deficiency     PAST SURGICAL HISTORY: Past Surgical History:  Procedure Laterality Date  . NO PAST SURGERIES      SOCIAL HISTORY: Social History   Tobacco Use  . Smoking status: Former Smoker    Last attempt to quit: 05/21/2014    Years since quitting: 3.0  . Smokeless tobacco: Never Used  Substance Use Topics  . Alcohol use: Not on file  . Drug use: Not on file    FAMILY HISTORY: Family History  Problem Relation Age of Onset  . Stroke Mother   . Anxiety disorder Mother   . Obesity Mother   . Diabetes Father   . Hyperlipidemia Father   . Stroke Father   . Depression Father   . Obesity  Father     ROS: Review of Systems  Constitutional: Negative for weight loss.  Eyes:       Negative Jaundice  Respiratory: Negative for shortness of breath.   Cardiovascular: Negative for chest pain and palpitations.  Gastrointestinal:       Negative right upper quadrant pain  Endo/Heme/Allergies:       Positive polyphagia Negative hypoglycemia    PHYSICAL EXAM: Blood pressure (!) 154/89, pulse 85, temperature 97.8 F (36.6 C), temperature source Oral, height 5\' 4"  (1.626 m), weight 189 lb (85.7 kg), last menstrual period 06/02/2005, SpO2  98 %. Body mass index is 32.44 kg/m. Physical Exam  Constitutional: She is oriented to person, place, and time. She appears well-developed and well-nourished.  Cardiovascular: Normal rate.  Pulmonary/Chest: Effort normal.  Musculoskeletal: Normal range of motion.  Neurological: She is oriented to person, place, and time.  Skin: Skin is warm and dry.  Psychiatric: She has a normal mood and affect. Her behavior is normal.  Vitals reviewed.   RECENT LABS AND TESTS: BMET    Component Value Date/Time   NA 139 01/05/2017 0839   K 4.5 01/05/2017 0839   CL 103 01/05/2017 0839   CO2 23 01/05/2017 0839   GLUCOSE 93 01/05/2017 0839   BUN 23 01/05/2017 0839   CREATININE 0.85 01/05/2017 0839   CALCIUM 9.5 01/05/2017 0839   GFRNONAA 74 01/05/2017 0839   GFRAA 86 01/05/2017 0839   Lab Results  Component Value Date   HGBA1C 5.3 01/05/2017   HGBA1C 5.3 08/27/2016   HGBA1C 5.4 05/21/2016   HGBA1C 5.4 09/10/2015   Lab Results  Component Value Date   INSULIN 11.6 01/05/2017   INSULIN 11.6 08/27/2016   INSULIN 13.5 05/21/2016   CBC    Component Value Date/Time   WBC 7.3 05/21/2016 1504   RBC 4.97 05/21/2016 1504   HGB 14.7 05/21/2016 1504   HCT 43.4 05/21/2016 1504   MCV 87 05/21/2016 1504   MCH 29.6 05/21/2016 1504   MCHC 33.9 05/21/2016 1504   RDW 13.1 05/21/2016 1504   LYMPHSABS 1.9 05/21/2016 1504   EOSABS 0.1 05/21/2016 1504   BASOSABS 0.0 05/21/2016 1504   Iron/TIBC/Ferritin/ %Sat No results found for: IRON, TIBC, FERRITIN, IRONPCTSAT Lipid Panel     Component Value Date/Time   CHOL 220 (H) 01/05/2017 0839   TRIG 126 01/05/2017 0839   HDL 56 01/05/2017 0839   LDLCALC 139 (H) 01/05/2017 0839   Hepatic Function Panel     Component Value Date/Time   PROT 6.6 01/05/2017 0839   ALBUMIN 4.3 01/05/2017 0839   AST 33 01/05/2017 0839   ALT 56 (H) 01/05/2017 0839   ALKPHOS 84 01/05/2017 0839   BILITOT 0.4 01/05/2017 0839      Component Value Date/Time   TSH 1.230  05/21/2016 1504   TSH 1.19 09/10/2015    ASSESSMENT AND PLAN: Elevated LFTs - Plan: Lipid Panel With LDL/HDL Ratio  Essential hypertension - Plan: VITAMIN D 25 Hydroxy (Vit-D Deficiency, Fractures), TSH, T4, free, T3  Insulin resistance - Plan: Comprehensive metabolic panel, Hemoglobin A1c, Insulin, random  Class 1 obesity with serious comorbidity and body mass index (BMI) of 32.0 to 32.9 in adult, unspecified obesity type  PLAN:  Elevated LFT We discussed the likely diagnosis of non alcoholic fatty liver disease today and how this condition is obesity related. Chevy was educated on her risk of developing NASH or even liver failure and th only proven treatment for NAFLD was weight loss. Helene Kelp  agreed to continue with her weight loss efforts with healthier diet and exercise as an essential part of her treatment plan. We will repeat CMP today and Altheia agrees to follow up with our clinic in 2 weeks.  Hypertension We discussed sodium restriction, working on healthy weight loss, and a regular exercise program as the means to achieve improved blood pressure control. Alizzon agreed with this plan and agreed to follow up as directed. We will continue to monitor her blood pressure as well as her progress with the above lifestyle modifications. She will continue her medications as prescribed and will watch for signs of hypotension as she continues her lifestyle modifications. Eiliana agrees to follow up with our clinic in 2 weeks and we will recheck blood pressure at that time.  Insulin Resistance Dina will continue to work on weight loss, exercise, and decreasing simple carbohydrates in her diet to help decrease the risk of diabetes. We dicussed metformin including benefits and risks. She was informed that eating too many simple carbohydrates or too many calories at one sitting increases the likelihood of GI side effects. Rayyan declined metformin for now and prescription was not written today. We  will repeat Hgb A1c and insulin today and Lakishia agrees to follow up with our clinic in 2 weeks as directed to monitor her progress.  Obesity Rajah is currently in the action stage of change. As such, her goal is to continue with weight loss efforts She has agreed to change to keep a food journal with 550 calories and 35+ grams of protein at supper daily and follow the Category 2 plan Clovia has been instructed to work up to a goal of 150 minutes of combined cardio and strengthening exercise per week for weight loss and overall health benefits. We discussed the following Behavioral Modification Strategies today: increasing lean protein intake, decreasing simple carbohydrates, work on meal planning and easy cooking plans, and keep a strict food journal   Soraiya has agreed to follow up with our clinic in 2 weeks. She was informed of the importance of frequent follow up visits to maximize her success with intensive lifestyle modifications for her multiple health conditions.   OBESITY BEHAVIORAL INTERVENTION VISIT  Today's visit was # 17 out of 22.  Starting weight: 201 lbs Starting date: 05/21/16 Today's weight : 189 lbs  Today's date: 06/22/2017 Total lbs lost to date: 12 (Patients must lose 7 lbs in the first 6 months to continue with counseling)   ASK: We discussed the diagnosis of obesity with Kipp Laurence today and Shalandra agreed to give Connie Clarke permission to discuss obesity behavioral modification therapy today.  ASSESS: Lariah has the diagnosis of obesity and her BMI today is 32.43 Steffi is in the action stage of change   ADVISE: Jasara was educated on the multiple health risks of obesity as well as the benefit of weight loss to improve her health. She was advised of the need for long term treatment and the importance of lifestyle modifications.  AGREE: Multiple dietary modification options and treatment options were discussed and  Alexxis agreed to the above obesity treatment  plan.  I, Trixie Dredge, am acting as transcriptionist for Dennard Nip, MD  I have reviewed the above documentation for accuracy and completeness, and I agree with the above. -Dennard Nip, MD

## 2017-06-23 LAB — COMPREHENSIVE METABOLIC PANEL
ALK PHOS: 90 IU/L (ref 39–117)
ALT: 55 IU/L — ABNORMAL HIGH (ref 0–32)
AST: 29 IU/L (ref 0–40)
Albumin/Globulin Ratio: 1.7 (ref 1.2–2.2)
Albumin: 4.4 g/dL (ref 3.6–4.8)
BUN/Creatinine Ratio: 27 (ref 12–28)
BUN: 22 mg/dL (ref 8–27)
Bilirubin Total: 0.3 mg/dL (ref 0.0–1.2)
CALCIUM: 9.8 mg/dL (ref 8.7–10.3)
CO2: 24 mmol/L (ref 20–29)
CREATININE: 0.82 mg/dL (ref 0.57–1.00)
Chloride: 102 mmol/L (ref 96–106)
GFR calc Af Amer: 89 mL/min/{1.73_m2} (ref >59)
GFR, EST NON AFRICAN AMERICAN: 77 mL/min/{1.73_m2} (ref >59)
GLOBULIN, TOTAL: 2.6 g/dL (ref 1.5–4.5)
GLUCOSE: 106 mg/dL — AB (ref 65–99)
Potassium: 5 mmol/L (ref 3.5–5.2)
SODIUM: 141 mmol/L (ref 134–144)
Total Protein: 7 g/dL (ref 6.0–8.5)

## 2017-06-23 LAB — LIPID PANEL WITH LDL/HDL RATIO
CHOLESTEROL TOTAL: 246 mg/dL — AB (ref 100–199)
HDL: 65 mg/dL (ref >39)
LDL Calculated: 161 mg/dL — ABNORMAL HIGH (ref 0–99)
LDl/HDL Ratio: 2.5 ratio (ref 0.0–3.2)
TRIGLYCERIDES: 102 mg/dL (ref 0–149)
VLDL Cholesterol Cal: 20 mg/dL (ref 5–40)

## 2017-06-23 LAB — INSULIN, RANDOM: INSULIN: 20.5 u[IU]/mL (ref 2.6–24.9)

## 2017-06-23 LAB — VITAMIN D 25 HYDROXY (VIT D DEFICIENCY, FRACTURES): Vit D, 25-Hydroxy: 61 ng/mL (ref 30.0–100.0)

## 2017-06-23 LAB — HEMOGLOBIN A1C
ESTIMATED AVERAGE GLUCOSE: 111 mg/dL
HEMOGLOBIN A1C: 5.5 % (ref 4.8–5.6)

## 2017-07-13 ENCOUNTER — Ambulatory Visit (INDEPENDENT_AMBULATORY_CARE_PROVIDER_SITE_OTHER): Payer: 59 | Admitting: Family Medicine

## 2017-07-13 VITALS — BP 118/76 | HR 78 | Temp 97.9°F | Ht 64.0 in | Wt 190.0 lb

## 2017-07-13 DIAGNOSIS — E8881 Metabolic syndrome: Secondary | ICD-10-CM

## 2017-07-13 DIAGNOSIS — E669 Obesity, unspecified: Secondary | ICD-10-CM

## 2017-07-13 DIAGNOSIS — Z9189 Other specified personal risk factors, not elsewhere classified: Secondary | ICD-10-CM

## 2017-07-13 DIAGNOSIS — Z6832 Body mass index (BMI) 32.0-32.9, adult: Secondary | ICD-10-CM | POA: Diagnosis not present

## 2017-07-13 NOTE — Progress Notes (Signed)
Office: [phone]  /  Fax: [phone]   HPI:   Chief Complaint: OBESITY Connie Clarke is here to discuss her progress with her obesity treatment plan. She is on the keep a food journal with 550 calories and 35+ grams of protein at supper daily and follow the Category 2 plan and is following her eating plan approximately 30 % of the time. She states she is walking, doing yoga and pilates for 60 minutes 4 times per week. Connie Clarke is struggling to stay on track with her diet prescription. She is having other challenges in her life and not currently in the action stage of change. 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  Her weight is 190 lb (86.2 kg) today and has gained 1 pound since her last visit. She has lost 11 lbs since starting treatment with [name].  Insulin Resistance Connie Clarke has a diagnosis of insulin resistance based on her elevated fasting insulin level >5. Her A1c and insulin is elevated, she is not on metformin and has increased simple carbohydrates. Although Connie Clarke's blood glucose readings are still under good control, insulin resistance puts her at greater risk of metabolic syndrome and diabetes. She continues to work on diet and exercise to decrease risk of diabetes.  At risk for diabetes Connie Clarke is at higher than average risk for developing diabetes due to her obesity and insulin resistance. She currently denies polyuria or polydipsia.  ALLERGIES: No Known Allergies  MEDICATIONS: Current Outpatient Medications on File Prior to Visit  Medication Sig Dispense Refill  . Cholecalciferol ([employer] VIT D 5000 HIGH-POTENCY) 5000 units capsule Take 1 capsule (5,000 Units total) by mouth daily.    [name] lisinopril (PRINIVIL,ZESTRIL) 10 MG tablet Take 10 mg by mouth every morning.  3  . Multiple Vitamins-Minerals (MULTIVITAMIN WITH MINERALS) tablet Take 1 tablet by mouth daily.    [name] omega-3 fish oil (MAXEPA) 1000 MG CAPS capsule Take 3 capsules (3,000 mg total) by  mouth daily. 90 each    No current facility-administered medications on file prior to visit.     PAST MEDICAL HISTORY: Past Medical History:  Diagnosis Date  . Depression   . Hyperlipidemia   . Hypertension   . Infertility, female   . Joint pain   . Palpitations   . Vitamin D deficiency     PAST SURGICAL HISTORY: Past Surgical History:  Procedure Laterality Date  . NO PAST SURGERIES      SOCIAL HISTORY: Social History   Tobacco Use  . Smoking status: Former Smoker    Last attempt to quit: 05/21/2014    Years since quitting: 3.1  . Smokeless tobacco: Never Used  Substance Use Topics  . Alcohol use: Not on file  . Drug use: Not on file    FAMILY HISTORY: Family History  Problem Relation Age of Onset  . Stroke Mother   . Anxiety disorder Mother   . Obesity Mother   . Diabetes Father   . Hyperlipidemia Father   . Stroke Father   . Depression Father   . Obesity Father     ROS: Review of Systems  Constitutional: Negative for weight loss.  Genitourinary: Negative for frequency.  Endo/Heme/Allergies: Negative for polydipsia.    PHYSICAL EXAM: Blood pressure 118/76, pulse 78, temperature 97.9 F (36.6 C), temperature source Oral, height 5\' 4"  (1.626 m), weight 190 lb (86.2 kg), last menstrual period 06/02/2005, SpO2 97 %. Body mass index is 32.61 kg/m. Physical Exam  Constitutional:  She is oriented to person, place, and time. She appears well-developed and well-nourished.  Cardiovascular: Normal rate.  Pulmonary/Chest: Effort normal.  Musculoskeletal: Normal range of motion.  Neurological: She is oriented to person, place, and time.  Skin: Skin is warm and dry.  Psychiatric: She has a normal mood and affect. Her behavior is normal.  Vitals reviewed.   RECENT LABS AND TESTS: BMET    Component Value Date/Time   NA 141 06/22/2017 0803   K 5.0 06/22/2017 0803   CL 102 06/22/2017 0803   CO2 24 06/22/2017 0803   GLUCOSE 106 (H) 06/22/2017 0803   BUN  22 06/22/2017 0803   CREATININE 0.82 06/22/2017 0803   CALCIUM 9.8 06/22/2017 0803   GFRNONAA 77 06/22/2017 0803   GFRAA 89 06/22/2017 0803   Lab Results  Component Value Date   HGBA1C 5.5 06/22/2017   HGBA1C 5.3 01/05/2017   HGBA1C 5.3 08/27/2016   HGBA1C 5.4 05/21/2016   HGBA1C 5.4 09/10/2015   Lab Results  Component Value Date   INSULIN 20.5 06/22/2017   INSULIN 11.6 01/05/2017   INSULIN 11.6 08/27/2016   INSULIN 13.5 05/21/2016   CBC    Component Value Date/Time   WBC 7.3 05/21/2016 1504   RBC 4.97 05/21/2016 1504   HGB 14.7 05/21/2016 1504   HCT 43.4 05/21/2016 1504   MCV 87 05/21/2016 1504   MCH 29.6 05/21/2016 1504   MCHC 33.9 05/21/2016 1504   RDW 13.1 05/21/2016 1504   LYMPHSABS 1.9 05/21/2016 1504   EOSABS 0.1 05/21/2016 1504   BASOSABS 0.0 05/21/2016 1504   Iron/TIBC/Ferritin/ %Sat No results found for: IRON, TIBC, FERRITIN, IRONPCTSAT Lipid Panel     Component Value Date/Time   CHOL 246 (H) 06/22/2017 0803   TRIG 102 06/22/2017 0803   HDL 65 06/22/2017 0803   LDLCALC 161 (H) 06/22/2017 0803   Hepatic Function Panel     Component Value Date/Time   PROT 7.0 06/22/2017 0803   ALBUMIN 4.4 06/22/2017 0803   AST 29 06/22/2017 0803   ALT 55 (H) 06/22/2017 0803   ALKPHOS 90 06/22/2017 0803   BILITOT 0.3 06/22/2017 0803      Component Value Date/Time   TSH 1.230 05/21/2016 1504   TSH 1.19 09/10/2015    ASSESSMENT AND PLAN: Insulin resistance  At risk for diabetes mellitus  Class 1 obesity with serious comorbidity and body mass index (BMI) of 32.0 to 32.9 in adult, unspecified obesity type  PLAN:  Insulin Resistance Connie Clarke will continue to work on weight loss, exercise, and decreasing simple carbohydrates in her diet to help decrease the risk of diabetes. We dicussed metformin including benefits and risks. She was informed that eating too many simple carbohydrates or too many calories at one sitting increases the likelihood of GI side effects.  Connie Clarke was educated on diet, exercise, and the importance of preventing diabetes mellitus. I strongly encouraged Connie Clarke to restart metformin but she declined. Connie Clarke agrees to follow up with our clinic in 3 months as directed to monitor her progress.  Diabetes risk counselling Connie Clarke was given extended (30 minutes) diabetes prevention counseling today. She is 62 y.o. female and has risk factors for diabetes including obesity and insulin resistance. We discussed intensive lifestyle modifications today with an emphasis on weight loss as well as increasing exercise and decreasing simple carbohydrates in her diet.  Obesity Connie Clarke is not currently in the action stage of change. As such, her goal is to continue with weight loss efforts She has agreed  to change to portion control better and make smarter food choices, such as increase vegetables and decrease simple carbohydrates  Connie Clarke has been instructed to work up to a goal of 150 minutes of combined cardio and strengthening exercise per week for weight loss and overall health benefits. We discussed the following Behavioral Modification Strategies today: increasing lean protein intake and decreasing simple carbohydrates    Connie Clarke has agreed to follow up with our clinic in 3 months. She was informed of the importance of frequent follow up visits to maximize her success with intensive lifestyle modifications for her multiple health conditions.   OBESITY BEHAVIORAL INTERVENTION VISIT  Today's visit was # 18 out of 22.  Starting weight: 201 lbs Starting date: 05/21/16 Today's weight : 190 lbs  Today's date: 07/13/2017 Total lbs lost to date: 11 (Patients must lose 7 lbs in the first 6 months to continue with counseling)   ASK: We discussed the diagnosis of obesity with Kipp Laurence today and Cesiah agreed to give [name] permission to discuss obesity behavioral modification therapy today.  ASSESS: Uzma has the diagnosis of obesity and her BMI  today is 32.6 Connie Clarke is not in the action stage of change   ADVISE: Roy was educated on the multiple health risks of obesity as well as the benefit of weight loss to improve her health. She was advised of the need for long term treatment and the importance of lifestyle modifications.  AGREE: Multiple dietary modification options and treatment options were discussed and  Aneyah agreed to the above obesity treatment plan.  I, Trixie Dredge, am acting as transcriptionist for Dennard Nip, MD  I have reviewed the above documentation for accuracy and completeness, and I agree with the above. -Dennard Nip, MD

## 2017-07-16 MED FILL — LISINOPRIL 10 MG TABS: 10 | 90 days supply | Qty: 90 | Fill #3

## 2017-08-03 ENCOUNTER — Other Ambulatory Visit: Payer: Self-pay | Admitting: Family Medicine

## 2017-08-03 DIAGNOSIS — Z1231 Encounter for screening mammogram for malignant neoplasm of breast: Secondary | ICD-10-CM

## 2017-08-07 DIAGNOSIS — H40053 Ocular hypertension, bilateral: Secondary | ICD-10-CM | POA: Diagnosis not present

## 2017-08-07 DIAGNOSIS — H5213 Myopia, bilateral: Secondary | ICD-10-CM | POA: Diagnosis not present

## 2017-08-07 DIAGNOSIS — H1045 Other chronic allergic conjunctivitis: Secondary | ICD-10-CM | POA: Diagnosis not present

## 2017-08-07 DIAGNOSIS — H52223 Regular astigmatism, bilateral: Secondary | ICD-10-CM | POA: Diagnosis not present

## 2017-08-07 DIAGNOSIS — H40013 Open angle with borderline findings, low risk, bilateral: Secondary | ICD-10-CM | POA: Diagnosis not present

## 2017-08-07 DIAGNOSIS — H11151 Pinguecula, right eye: Secondary | ICD-10-CM | POA: Diagnosis not present

## 2017-08-07 DIAGNOSIS — H524 Presbyopia: Secondary | ICD-10-CM | POA: Diagnosis not present

## 2017-08-07 DIAGNOSIS — H25013 Cortical age-related cataract, bilateral: Secondary | ICD-10-CM | POA: Diagnosis not present

## 2017-08-24 ENCOUNTER — Ambulatory Visit
Admission: RE | Admit: 2017-08-24 | Discharge: 2017-08-24 | Disposition: A | Discharge status: Home / Self Care | Payer: 59 | Source: Ambulatory Visit | Attending: Family Medicine | Admitting: Family Medicine

## 2017-08-24 DIAGNOSIS — Z1231 Encounter for screening mammogram for malignant neoplasm of breast: Secondary | ICD-10-CM

## 2017-08-25 ENCOUNTER — Other Ambulatory Visit: Payer: Self-pay | Admitting: Family Medicine

## 2017-08-25 DIAGNOSIS — R928 Other abnormal and inconclusive findings on diagnostic imaging of breast: Secondary | ICD-10-CM

## 2017-08-26 ENCOUNTER — Ambulatory Visit
Admission: RE | Admit: 2017-08-26 | Discharge: 2017-08-26 | Disposition: A | Discharge status: Home / Self Care | Payer: 59 | Source: Ambulatory Visit | Attending: Family Medicine | Admitting: Family Medicine

## 2017-08-26 ENCOUNTER — Other Ambulatory Visit: Payer: Self-pay | Admitting: Family Medicine

## 2017-08-26 DIAGNOSIS — N6320 Unspecified lump in the left breast, unspecified quadrant: Secondary | ICD-10-CM

## 2017-08-26 DIAGNOSIS — N6489 Other specified disorders of breast: Secondary | ICD-10-CM | POA: Diagnosis not present

## 2017-08-26 DIAGNOSIS — R928 Other abnormal and inconclusive findings on diagnostic imaging of breast: Secondary | ICD-10-CM | POA: Diagnosis not present

## 2017-08-27 ENCOUNTER — Other Ambulatory Visit: Payer: Self-pay | Admitting: Family Medicine

## 2017-08-27 ENCOUNTER — Ambulatory Visit
Admission: RE | Admit: 2017-08-27 | Discharge: 2017-08-27 | Disposition: A | Discharge status: Home / Self Care | Payer: 59 | Source: Ambulatory Visit | Attending: Family Medicine | Admitting: Family Medicine

## 2017-08-27 DIAGNOSIS — C50412 Malignant neoplasm of upper-outer quadrant of left female breast: Secondary | ICD-10-CM | POA: Diagnosis not present

## 2017-08-27 DIAGNOSIS — N6320 Unspecified lump in the left breast, unspecified quadrant: Secondary | ICD-10-CM

## 2017-08-27 DIAGNOSIS — N6321 Unspecified lump in the left breast, upper outer quadrant: Secondary | ICD-10-CM | POA: Diagnosis not present

## 2017-08-28 ENCOUNTER — Telehealth: Payer: Self-pay | Admitting: *Deleted

## 2017-08-28 ENCOUNTER — Other Ambulatory Visit: Payer: 59

## 2017-08-28 NOTE — Telephone Encounter (Signed)
Scheduled and confirmed new pt appt with Dr. Lindi Adie on 09/01/17 at [place] pt contact information for questions or needs. Sent intake form via email to verified email address. Encourage pt to call with questions or needs. Received verbal understanding.

## 2017-09-01 ENCOUNTER — Inpatient Hospital Stay: Payer: 59 | Attending: Hematology and Oncology | Admitting: Hematology and Oncology

## 2017-09-01 ENCOUNTER — Encounter: Payer: Self-pay | Admitting: *Deleted

## 2017-09-01 ENCOUNTER — Encounter (INDEPENDENT_AMBULATORY_CARE_PROVIDER_SITE_OTHER): Payer: Self-pay | Admitting: Family Medicine

## 2017-09-01 DIAGNOSIS — C50412 Malignant neoplasm of upper-outer quadrant of left female breast: Secondary | ICD-10-CM | POA: Diagnosis not present

## 2017-09-01 DIAGNOSIS — Z170 Estrogen receptor positive status [ER+]: Secondary | ICD-10-CM | POA: Diagnosis not present

## 2017-09-01 MED ORDER — ALPRAZOLAM 0.25 MG PO TABS: 0.2500 mg | ORAL_TABLET | Freq: Every evening | ORAL | 1 refills | Status: DC | PRN

## 2017-09-01 MED FILL — ALPRAZolam 0.25 MG TABS: 0.25 | 30 days supply | Qty: 30 | Fill #0 | Status: TO

## 2017-09-01 NOTE — Assessment & Plan Note (Signed)
08/27/2017:Screening mammogram left breast showed possible asymmetry.  Targeted ultrasound revealed a 5 x 4 x 7 mm irregular hypoechoic mass 2 o'clock position 5 cm from nipple, no lymphadenopathy, biopsy showed IDC grade 2 with focal DCIS intermediate grade, HER-2 negative ratio 1.32, T1b N0 stage I a clinical stage AJCC 8  Pathology and radiology counseling:Discussed with the patient, the details of pathology including the type of breast cancer,the clinical staging, the significance of ER, PR and HER-2/neu receptors and the implications for treatment. After reviewing the pathology in detail, we proceeded to discuss the different treatment options between surgery, radiation, chemotherapy, antiestrogen therapies.  Recommendations: 1. Breast conserving surgery followed by 2. Oncotype DX testing to determine if chemotherapy would be of any benefit followed by 3. Adjuvant radiation therapy followed by 4. Adjuvant antiestrogen therapy  Oncotype counseling: I discussed Oncotype DX test. I explained to the patient that this is a 21 gene panel to evaluate patient tumors DNA to calculate recurrence score. This would help determine whether patient has high risk or intermediate risk or low risk breast cancer. She understands that if her tumor was found to be high risk, she would benefit from systemic chemotherapy. If low risk, no need of chemotherapy. If she was found to be intermediate risk, we would need to evaluate the score as well as other risk factors and determine if an abbreviated chemotherapy may be of benefit.  Return to clinic after surgery to discuss final pathology report and then determine if Oncotype DX testing will need to be sent.

## 2017-09-01 NOTE — Progress Notes (Signed)
[facility] NOTE  Patient Care Team: Darcus Austin, MD as PCP - General (Family Medicine)  CHIEF COMPLAINTS/PURPOSE OF CONSULTATION:  Newly diagnosed breast cancer  HISTORY OF PRESENTING ILLNESS:  Connie Clarke 62 y.o. female is here because of recent diagnosis of left breast cancer. patient had a screening mammogram which revealed an asymmetry in the left breast.  This was evaluated by ultrasound and was noted to have a 7 mm area of abnormality.  This was biopsied and the pathology came back as invasive ductal carcinoma grade 2 with DCIS grade 2.  The invasive cancer is ER PR 100% positive and HER-2 negative with a Ki-67 of 2%.  I reviewed her records extensively and collaborated the history with the patient.  SUMMARY OF ONCOLOGIC HISTORY:   Malignant neoplasm of upper-outer quadrant of left breast in female, estrogen receptor positive ([place])   08/27/2017 Initial Diagnosis    Screening mammogram left breast showed possible asymmetry.  Targeted ultrasound revealed a 5 x 4 x 7 mm irregular hypoechoic mass 2 o'clock position 5 cm from nipple, no lymphadenopathy, biopsy showed IDC grade 2 with focal DCIS intermediate grade, HER-2 negative ratio 1.32, T1b N0 stage I a clinical stage AJCC 8       MEDICAL HISTORY:  Past Medical History:  Diagnosis Date  . Depression   . Hyperlipidemia   . Hypertension   . Infertility, female   . Joint pain   . Palpitations   . Vitamin D deficiency     SURGICAL HISTORY: Past Surgical History:  Procedure Laterality Date  . NO PAST SURGERIES      SOCIAL HISTORY: Social History   Socioeconomic History  . Marital status: Married    Spouse name: Not on file  . Number of children: Not on file  . Years of education: Not on file  . Highest education level: Not on file  Occupational History  . Occupation: Physical Diplomatic Services operational officer: [employer]  Social Needs  . Financial resource strain: Not on file  . Food insecurity:     Worry: Not on file    Inability: Not on file  . Transportation needs:    Medical: Not on file    Non-medical: Not on file  Tobacco Use  . Smoking status: Former Smoker    Last attempt to quit: 05/21/2014    Years since quitting: 3.2  . Smokeless tobacco: Never Used  Substance and Sexual Activity  . Alcohol use: Not on file  . Drug use: Not on file  . Sexual activity: Not on file  Lifestyle  . Physical activity:    Days per week: Not on file    Minutes per session: Not on file  . Stress: Not on file  Relationships  . Social connections:    Talks on phone: Not on file    Gets together: Not on file    Attends religious service: Not on file    Active member of club or organization: Not on file    Attends meetings of clubs or organizations: Not on file    Relationship status: Not on file  . Intimate partner violence:    Fear of current or ex partner: Not on file    Emotionally abused: Not on file    Physically abused: Not on file    Forced sexual activity: Not on file  Other Topics Concern  . Not on file  Social History Narrative  . Not on file  FAMILY HISTORY: Family History  Problem Relation Age of Onset  . Stroke Mother   . Anxiety disorder Mother   . Obesity Mother   . Diabetes Father   . Hyperlipidemia Father   . Stroke Father   . Depression Father   . Obesity Father   . Breast cancer Neg Hx     ALLERGIES:  has No Known Allergies.  MEDICATIONS:  Current Outpatient Medications  Medication Sig Dispense Refill  . Cholecalciferol ([employer]S VIT D 5000 HIGH-POTENCY) 5000 units capsule Take 1 capsule (5,000 Units total) by mouth daily.    [name] lisinopril (PRINIVIL,ZESTRIL) 10 MG tablet Take 10 mg by mouth every morning.  3  . Multiple Vitamins-Minerals (MULTIVITAMIN WITH MINERALS) tablet Take 1 tablet by mouth daily.    [name] omega-3 fish oil (MAXEPA) 1000 MG CAPS capsule Take 3 capsules (3,000 mg total) by mouth daily. 90 each    No current facility-administered  medications for this visit.     REVIEW OF SYSTEMS:   Constitutional: Denies fevers, chills or abnormal night sweats Eyes: Denies blurriness of vision, double vision or watery eyes Ears, nose, mouth, throat, and face: Denies mucositis or sore throat Respiratory: Denies cough, dyspnea or wheezes Cardiovascular: Denies palpitation, chest discomfort or lower extremity swelling Gastrointestinal:  Denies nausea, heartburn or change in bowel habits Skin: Denies abnormal skin rashes Lymphatics: Denies new lymphadenopathy or easy bruising Neurological:Denies numbness, tingling or new weaknesses Behavioral/Psych: Mood is stable, no new changes  Breast:  Denies any palpable lumps or discharge All other systems were reviewed with the patient and are negative.  PHYSICAL EXAMINATION: ECOG PERFORMANCE STATUS: 0 - Asymptomatic  Vitals:   09/01/17 1257  BP: (!) 155/71  Pulse: 89  Resp: 17  Temp: 97.7 F (36.5 C)  SpO2: 100%   Filed Weights   09/01/17 1257  Weight: 197 lb 6.4 oz (89.5 kg)    GENERAL:alert, no distress and comfortable SKIN: skin color, texture, turgor are normal, no rashes or significant lesions EYES: normal, conjunctiva are pink and non-injected, sclera clear OROPHARYNX:no exudate, no erythema and lips, buccal mucosa, and tongue normal  NECK: supple, thyroid normal size, non-tender, without nodularity LYMPH:  no palpable lymphadenopathy in the cervical, axillary or inguinal LUNGS: clear to auscultation and percussion with normal breathing effort HEART: regular rate & rhythm and no murmurs and no lower extremity edema ABDOMEN:abdomen soft, non-tender and normal bowel sounds Musculoskeletal:no cyanosis of digits and no clubbing  PSYCH: alert & oriented x 3 with fluent speech NEURO: no focal motor/sensory deficits BREAST: No palpable nodules in breast. No palpable axillary or supraclavicular lymphadenopathy (exam performed in the presence of a chaperone)   LABORATORY DATA:   I have reviewed the data as listed Lab Results  Component Value Date   WBC 7.3 05/21/2016   HGB 14.7 05/21/2016   HCT 43.4 05/21/2016   MCV 87 05/21/2016   Lab Results  Component Value Date   NA 141 06/22/2017   K 5.0 06/22/2017   CL 102 06/22/2017   CO2 24 06/22/2017    RADIOGRAPHIC STUDIES: I have personally reviewed the radiological reports and agreed with the findings in the report.  ASSESSMENT AND PLAN:  Malignant neoplasm of upper-outer quadrant of left breast in female, estrogen receptor positive ([place]) 08/27/2017:Screening mammogram left breast showed possible asymmetry.  Targeted ultrasound revealed a 5 x 4 x 7 mm irregular hypoechoic mass 2 o'clock position 5 cm from nipple, no lymphadenopathy, biopsy showed IDC grade 2 with focal DCIS intermediate grade,  HER-2 negative ratio 1.32, T1b N0 stage I a clinical stage AJCC 8  Pathology and radiology counseling:Discussed with the patient, the details of pathology including the type of breast cancer,the clinical staging, the significance of ER, PR and HER-2/neu receptors and the implications for treatment. After reviewing the pathology in detail, we proceeded to discuss the different treatment options between surgery, radiation, chemotherapy, antiestrogen therapies.  Recommendations: 1. Breast conserving surgery followed by 2. Oncotype DX testing to determine if chemotherapy would be of any benefit followed by 3. Adjuvant radiation therapy followed by 4. Adjuvant antiestrogen therapy  Oncotype counseling: I discussed Oncotype DX test. I explained to the patient that this is a 21 gene panel to evaluate patient tumors DNA to calculate recurrence score. This would help determine whether patient has high risk or intermediate risk or low risk breast cancer. She understands that if her tumor was found to be high risk, she would benefit from systemic chemotherapy. If low risk, no need of chemotherapy. If she was found to be intermediate  risk, we would need to evaluate the score as well as other risk factors and determine if an abbreviated chemotherapy may be of benefit.  Return to clinic after surgery to discuss final pathology report and then determine if Oncotype DX testing will need to be sent.   All questions were answered. The patient knows to call the clinic with any problems, questions or concerns.    Harriette Ohara, MD 09/01/17

## 2017-09-02 ENCOUNTER — Other Ambulatory Visit: Payer: Self-pay | Admitting: *Deleted

## 2017-09-02 ENCOUNTER — Encounter: Payer: Self-pay | Admitting: Radiation Oncology

## 2017-09-02 DIAGNOSIS — Z170 Estrogen receptor positive status [ER+]: Principal | ICD-10-CM

## 2017-09-02 DIAGNOSIS — C50412 Malignant neoplasm of upper-outer quadrant of left female breast: Secondary | ICD-10-CM

## 2017-09-02 NOTE — Telephone Encounter (Signed)
Please make sure this patient appt is cancelled.

## 2017-09-04 ENCOUNTER — Other Ambulatory Visit: Payer: Self-pay | Admitting: General Surgery

## 2017-09-04 DIAGNOSIS — Z170 Estrogen receptor positive status [ER+]: Principal | ICD-10-CM

## 2017-09-04 DIAGNOSIS — C50912 Malignant neoplasm of unspecified site of left female breast: Secondary | ICD-10-CM

## 2017-09-07 ENCOUNTER — Other Ambulatory Visit: Payer: Self-pay

## 2017-09-07 ENCOUNTER — Other Ambulatory Visit: Payer: Self-pay | Admitting: General Surgery

## 2017-09-07 ENCOUNTER — Encounter (HOSPITAL_BASED_OUTPATIENT_CLINIC_OR_DEPARTMENT_OTHER): Payer: Self-pay | Admitting: *Deleted

## 2017-09-07 ENCOUNTER — Encounter (HOSPITAL_BASED_OUTPATIENT_CLINIC_OR_DEPARTMENT_OTHER): Payer: 59

## 2017-09-07 ENCOUNTER — Encounter (HOSPITAL_BASED_OUTPATIENT_CLINIC_OR_DEPARTMENT_OTHER)
Admission: RE | Admit: 2017-09-07 | Discharge: 2017-09-07 | Disposition: A | Discharge status: Home / Self Care | Payer: 59 | Source: Ambulatory Visit | Attending: General Surgery | Admitting: General Surgery

## 2017-09-07 DIAGNOSIS — R9431 Abnormal electrocardiogram [ECG] [EKG]: Secondary | ICD-10-CM | POA: Diagnosis not present

## 2017-09-07 DIAGNOSIS — Z01818 Encounter for other preprocedural examination: Secondary | ICD-10-CM | POA: Diagnosis not present

## 2017-09-07 DIAGNOSIS — C50912 Malignant neoplasm of unspecified site of left female breast: Secondary | ICD-10-CM

## 2017-09-07 DIAGNOSIS — Z170 Estrogen receptor positive status [ER+]: Principal | ICD-10-CM

## 2017-09-07 NOTE — Progress Notes (Signed)
EKG reviewed by Dr. Turk, will proceed with surgery as scheduled. 

## 2017-09-07 NOTE — Progress Notes (Signed)
NPO except Ensure Presurg given to complete by 0300 day of surg. And any meds previously discussed with pre op phone interview nurse. Pt verbalized understanding.

## 2017-09-09 ENCOUNTER — Telehealth: Payer: Self-pay | Admitting: Hematology and Oncology

## 2017-09-09 NOTE — Telephone Encounter (Signed)
Left voicemail informing FMLA paperwork has been successfully faxed to [employer] at [phone]. Also mailed a copy to patient address on file.

## 2017-09-10 ENCOUNTER — Ambulatory Visit
Admission: RE | Admit: 2017-09-10 | Discharge: 2017-09-10 | Disposition: A | Discharge status: Home / Self Care | Payer: 59 | Source: Ambulatory Visit | Attending: General Surgery | Admitting: General Surgery

## 2017-09-10 ENCOUNTER — Other Ambulatory Visit: Payer: Self-pay | Admitting: General Surgery

## 2017-09-10 ENCOUNTER — Encounter: Payer: Self-pay | Admitting: Radiation Oncology

## 2017-09-10 DIAGNOSIS — Z170 Estrogen receptor positive status [ER+]: Principal | ICD-10-CM

## 2017-09-10 DIAGNOSIS — C50912 Malignant neoplasm of unspecified site of left female breast: Secondary | ICD-10-CM

## 2017-09-10 DIAGNOSIS — C50812 Malignant neoplasm of overlapping sites of left female breast: Secondary | ICD-10-CM | POA: Diagnosis not present

## 2017-09-10 NOTE — Anesthesia Preprocedure Evaluation (Addendum)
Anesthesia Evaluation  Patient identified by MRN, date of birth, ID band Patient awake    Reviewed: Allergy & Precautions, NPO status , Patient's Chart, lab work & pertinent test results  Airway Mallampati: II  TM Distance: >3 FB Neck ROM: Full    Dental no notable dental hx.    Pulmonary neg pulmonary ROS, former smoker,    Pulmonary exam normal breath sounds clear to auscultation       Cardiovascular Exercise Tolerance: Good hypertension, Pt. on medications Normal cardiovascular exam Rhythm:Regular Rate:Normal     Neuro/Psych Anxiety negative neurological ROS     GI/Hepatic negative GI ROS, Neg liver ROS,   Endo/Other  negative endocrine ROS  Renal/GU negative Renal ROS     Musculoskeletal   Abdominal   Peds  Hematology   Anesthesia Other Findings   Reproductive/Obstetrics                           Lab Results  Component Value Date   WBC 7.3 05/21/2016   HGB 14.7 05/21/2016   HCT 43.4 05/21/2016   MCV 87 05/21/2016    Anesthesia Physical Anesthesia Plan  ASA: II  Anesthesia Plan: General   Post-op Pain Management:  Regional for Post-op pain   Induction: Intravenous  PONV Risk Score and Plan: 2 and Treatment may vary due to age or medical condition, Dexamethasone, Ondansetron and Scopolamine patch - Pre-op  Airway Management Planned: LMA  Additional Equipment:   Intra-op Plan:   Post-operative Plan:   Informed Consent: I have reviewed the patients History and Physical, chart, labs and discussed the procedure including the risks, benefits and alternatives for the proposed anesthesia with the patient or authorized representative who has indicated his/her understanding and acceptance.     Plan Discussed with: CRNA  Anesthesia Plan Comments:         Anesthesia Quick Evaluation

## 2017-09-11 ENCOUNTER — Telehealth: Payer: Self-pay | Admitting: Hematology and Oncology

## 2017-09-11 ENCOUNTER — Ambulatory Visit (HOSPITAL_COMMUNITY)
Admission: RE | Admit: 2017-09-11 | Discharge: 2017-09-11 | Disposition: A | Discharge status: Home / Self Care | Payer: 59 | Source: Ambulatory Visit | Attending: General Surgery | Admitting: General Surgery

## 2017-09-11 ENCOUNTER — Ambulatory Visit (HOSPITAL_BASED_OUTPATIENT_CLINIC_OR_DEPARTMENT_OTHER): Payer: 59 | Admitting: Anesthesiology

## 2017-09-11 ENCOUNTER — Other Ambulatory Visit: Payer: Self-pay

## 2017-09-11 ENCOUNTER — Encounter (HOSPITAL_BASED_OUTPATIENT_CLINIC_OR_DEPARTMENT_OTHER)
Admission: RE | Disposition: A | Discharge status: Home / Self Care | Payer: Self-pay | Source: Ambulatory Visit | Attending: General Surgery

## 2017-09-11 ENCOUNTER — Ambulatory Visit (HOSPITAL_BASED_OUTPATIENT_CLINIC_OR_DEPARTMENT_OTHER)
Admission: RE | Admit: 2017-09-11 | Discharge: 2017-09-11 | Disposition: A | Discharge status: Home / Self Care | Payer: 59 | Source: Ambulatory Visit | Attending: General Surgery | Admitting: General Surgery

## 2017-09-11 ENCOUNTER — Encounter (HOSPITAL_BASED_OUTPATIENT_CLINIC_OR_DEPARTMENT_OTHER): Payer: Self-pay | Admitting: *Deleted

## 2017-09-11 ENCOUNTER — Ambulatory Visit
Admission: RE | Admit: 2017-09-11 | Discharge: 2017-09-11 | Disposition: A | Discharge status: Home / Self Care | Payer: 59 | Source: Ambulatory Visit | Attending: General Surgery | Admitting: General Surgery

## 2017-09-11 DIAGNOSIS — Z170 Estrogen receptor positive status [ER+]: Principal | ICD-10-CM

## 2017-09-11 DIAGNOSIS — C50912 Malignant neoplasm of unspecified site of left female breast: Secondary | ICD-10-CM | POA: Insufficient documentation

## 2017-09-11 DIAGNOSIS — Z8042 Family history of malignant neoplasm of prostate: Secondary | ICD-10-CM | POA: Diagnosis not present

## 2017-09-11 DIAGNOSIS — R928 Other abnormal and inconclusive findings on diagnostic imaging of breast: Secondary | ICD-10-CM | POA: Diagnosis not present

## 2017-09-11 DIAGNOSIS — F419 Anxiety disorder, unspecified: Secondary | ICD-10-CM | POA: Diagnosis not present

## 2017-09-11 DIAGNOSIS — Z171 Estrogen receptor negative status [ER-]: Secondary | ICD-10-CM | POA: Diagnosis not present

## 2017-09-11 DIAGNOSIS — Z87891 Personal history of nicotine dependence: Secondary | ICD-10-CM | POA: Insufficient documentation

## 2017-09-11 DIAGNOSIS — Z79899 Other long term (current) drug therapy: Secondary | ICD-10-CM | POA: Diagnosis not present

## 2017-09-11 DIAGNOSIS — N6012 Diffuse cystic mastopathy of left breast: Secondary | ICD-10-CM | POA: Diagnosis not present

## 2017-09-11 DIAGNOSIS — G8918 Other acute postprocedural pain: Secondary | ICD-10-CM | POA: Diagnosis not present

## 2017-09-11 DIAGNOSIS — I10 Essential (primary) hypertension: Secondary | ICD-10-CM | POA: Insufficient documentation

## 2017-09-11 DIAGNOSIS — Z808 Family history of malignant neoplasm of other organs or systems: Secondary | ICD-10-CM | POA: Insufficient documentation

## 2017-09-11 HISTORY — PX: BREAST LUMPECTOMY WITH RADIOACTIVE SEED AND SENTINEL LYMPH NODE BIOPSY: SHX6550

## 2017-09-11 HISTORY — DX: Anxiety disorder, unspecified: F41.9

## 2017-09-11 HISTORY — PX: BREAST LUMPECTOMY: SHX2

## 2017-09-11 SURGERY — BREAST LUMPECTOMY WITH RADIOACTIVE SEED AND SENTINEL LYMPH NODE BIOPSY
Anesthesia: General | Site: Breast | Laterality: Left

## 2017-09-11 MED ORDER — PROPOFOL 10 MG/ML IV BOLUS
INTRAVENOUS | Status: AC
  Filled 2017-09-11: qty 40

## 2017-09-11 MED ORDER — ONDANSETRON HCL 4 MG/2ML IJ SOLN
INTRAMUSCULAR | Status: AC
  Filled 2017-09-11: qty 2

## 2017-09-11 MED ORDER — TECHNETIUM TC 99M SULFUR COLLOID FILTERED
1.0000 | Freq: Once | INTRAVENOUS | Status: AC | PRN
  Administered 2017-09-11: 1 via INTRADERMAL

## 2017-09-11 MED ORDER — KETOROLAC TROMETHAMINE 15 MG/ML IJ SOLN
15.0000 mg | INTRAMUSCULAR | Status: AC
  Administered 2017-09-11: 15 mg via INTRAVENOUS
  Filled 2017-09-11: qty 1

## 2017-09-11 MED ORDER — HYDROMORPHONE HCL 1 MG/ML IJ SOLN: 0.2500 mg | INTRAMUSCULAR | Status: DC | PRN

## 2017-09-11 MED ORDER — MIDAZOLAM HCL 2 MG/2ML IJ SOLN
INTRAMUSCULAR | Status: AC
  Filled 2017-09-11: qty 2

## 2017-09-11 MED ORDER — ACETAMINOPHEN 10 MG/ML IV SOLN: 1000.0000 mg | Freq: Once | INTRAVENOUS | Status: DC | PRN

## 2017-09-11 MED ORDER — DEXAMETHASONE SODIUM PHOSPHATE 10 MG/ML IJ SOLN
INTRAMUSCULAR | Status: DC | PRN
  Administered 2017-09-11: 10 mg via INTRAVENOUS

## 2017-09-11 MED ORDER — FENTANYL CITRATE (PF) 100 MCG/2ML IJ SOLN
INTRAMUSCULAR | Status: AC
  Filled 2017-09-11: qty 2

## 2017-09-11 MED ORDER — ACETAMINOPHEN 500 MG PO TABS
1000.0000 mg | ORAL_TABLET | ORAL | Status: AC
  Administered 2017-09-11: 1000 mg via ORAL

## 2017-09-11 MED ORDER — MIDAZOLAM HCL 2 MG/2ML IJ SOLN
1.0000 mg | INTRAMUSCULAR | Status: DC | PRN
  Administered 2017-09-11: 2 mg via INTRAVENOUS

## 2017-09-11 MED ORDER — SCOPOLAMINE 1 MG/3DAYS TD PT72: 1.0000 | MEDICATED_PATCH | Freq: Once | TRANSDERMAL | Status: DC | PRN

## 2017-09-11 MED ORDER — ENSURE PRE-SURGERY PO LIQD: 296.0000 mL | Freq: Once | ORAL | Status: DC

## 2017-09-11 MED ORDER — DEXAMETHASONE SODIUM PHOSPHATE 10 MG/ML IJ SOLN
INTRAMUSCULAR | Status: AC
  Filled 2017-09-11: qty 1

## 2017-09-11 MED ORDER — CEFAZOLIN SODIUM-DEXTROSE 2-4 GM/100ML-% IV SOLN
INTRAVENOUS | Status: AC
  Filled 2017-09-11: qty 100

## 2017-09-11 MED ORDER — ACETAMINOPHEN 500 MG PO TABS
ORAL_TABLET | ORAL | Status: AC
  Filled 2017-09-11: qty 2

## 2017-09-11 MED ORDER — CHLORHEXIDINE GLUCONATE CLOTH 2 % EX PADS: 6.0000 | MEDICATED_PAD | Freq: Once | CUTANEOUS | Status: DC

## 2017-09-11 MED ORDER — GABAPENTIN 300 MG PO CAPS
300.0000 mg | ORAL_CAPSULE | ORAL | Status: AC
  Administered 2017-09-11: 300 mg via ORAL

## 2017-09-11 MED ORDER — PHENYLEPHRINE HCL 10 MG/ML IJ SOLN
INTRAMUSCULAR | Status: DC | PRN
  Administered 2017-09-11: 120 ug via INTRAVENOUS

## 2017-09-11 MED ORDER — GABAPENTIN 300 MG PO CAPS
ORAL_CAPSULE | ORAL | Status: AC
  Filled 2017-09-11: qty 1

## 2017-09-11 MED ORDER — BUPIVACAINE-EPINEPHRINE (PF) 0.25% -1:200000 IJ SOLN
INTRAMUSCULAR | Status: AC
  Filled 2017-09-11: qty 30

## 2017-09-11 MED ORDER — ROPIVACAINE HCL 5 MG/ML IJ SOLN
INTRAMUSCULAR | Status: DC | PRN
  Administered 2017-09-11: 30 mL

## 2017-09-11 MED ORDER — FENTANYL CITRATE (PF) 100 MCG/2ML IJ SOLN
50.0000 ug | INTRAMUSCULAR | Status: DC | PRN
  Administered 2017-09-11: 25 ug via INTRAVENOUS
  Administered 2017-09-11: 100 ug via INTRAVENOUS

## 2017-09-11 MED ORDER — LACTATED RINGERS IV SOLN
INTRAVENOUS | Status: DC
  Administered 2017-09-11: 07:00:00 via INTRAVENOUS

## 2017-09-11 MED ORDER — CEFAZOLIN SODIUM-DEXTROSE 2-3 GM-%(50ML) IV SOLR
INTRAVENOUS | Status: DC | PRN
  Administered 2017-09-11: 2 g via INTRAVENOUS

## 2017-09-11 MED ORDER — HYDROCODONE-ACETAMINOPHEN 7.5-325 MG PO TABS: 1.0000 | ORAL_TABLET | Freq: Once | ORAL | Status: DC | PRN

## 2017-09-11 MED ORDER — CEFAZOLIN SODIUM-DEXTROSE 2-4 GM/100ML-% IV SOLN: 2.0000 g | INTRAVENOUS | Status: DC

## 2017-09-11 MED ORDER — ONDANSETRON HCL 4 MG/2ML IJ SOLN
INTRAMUSCULAR | Status: DC | PRN
  Administered 2017-09-11: 4 mg via INTRAVENOUS

## 2017-09-11 MED ORDER — TRAMADOL HCL 50 MG PO TABS: 100.0000 mg | ORAL_TABLET | Freq: Four times a day (QID) | ORAL | 0 refills | Status: DC | PRN

## 2017-09-11 MED ORDER — LIDOCAINE HCL (CARDIAC) 20 MG/ML IV SOLN
INTRAVENOUS | Status: DC | PRN
  Administered 2017-09-11: 80 mg via INTRAVENOUS

## 2017-09-11 MED ORDER — MEPERIDINE HCL 25 MG/ML IJ SOLN: 6.2500 mg | INTRAMUSCULAR | Status: DC | PRN

## 2017-09-11 MED ORDER — BUPIVACAINE-EPINEPHRINE (PF) 0.25% -1:200000 IJ SOLN
INTRAMUSCULAR | Status: DC | PRN
  Administered 2017-09-11: 11 mL

## 2017-09-11 MED ORDER — EPHEDRINE SULFATE 50 MG/ML IJ SOLN
INTRAMUSCULAR | Status: DC | PRN
  Administered 2017-09-11: 15 mg via INTRAVENOUS
  Administered 2017-09-11: 10 mg via INTRAVENOUS

## 2017-09-11 MED ORDER — PROPOFOL 10 MG/ML IV BOLUS
INTRAVENOUS | Status: DC | PRN
  Administered 2017-09-11: 150 mg via INTRAVENOUS

## 2017-09-11 MED ORDER — PROMETHAZINE HCL 25 MG/ML IJ SOLN: 6.2500 mg | INTRAMUSCULAR | Status: DC | PRN

## 2017-09-11 MED FILL — traMADol HCL 50 MG TABS: 50 | 2 days supply | Qty: 15 | Fill #0

## 2017-09-11 SURGICAL SUPPLY — 53 items
ADH SKN CLS APL DERMABOND .7 (GAUZE/BANDAGES/DRESSINGS) ×1
BINDER BREAST XLRG (GAUZE/BANDAGES/DRESSINGS) ×2 IMPLANT
BLADE SURG 15 STRL LF DISP TIS (BLADE) ×1 IMPLANT
BLADE SURG 15 STRL SS (BLADE) ×3
CHLORAPREP W/TINT 26ML (MISCELLANEOUS) ×3 IMPLANT
CLIP VESOCCLUDE SM WIDE 6/CT (CLIP) ×3 IMPLANT
CLOSURE WOUND 1/2 X4 (GAUZE/BANDAGES/DRESSINGS) ×1
COVER BACK TABLE 60X90IN (DRAPES) ×3 IMPLANT
COVER MAYO STAND STRL (DRAPES) ×3 IMPLANT
COVER PROBE W GEL 5X96 (DRAPES) ×3 IMPLANT
DECANTER SPIKE VIAL GLASS SM (MISCELLANEOUS) IMPLANT
DERMABOND ADVANCED (GAUZE/BANDAGES/DRESSINGS) ×2
DERMABOND ADVANCED .7 DNX12 (GAUZE/BANDAGES/DRESSINGS) IMPLANT
DEVICE DUBIN W/COMP PLATE 8390 (MISCELLANEOUS) ×3 IMPLANT
DRAPE LAPAROSCOPIC ABDOMINAL (DRAPES) ×3 IMPLANT
DRAPE UTILITY XL STRL (DRAPES) ×3 IMPLANT
ELECT COATED BLADE 2.86 ST (ELECTRODE) ×3 IMPLANT
ELECT REM PT RETURN 9FT ADLT (ELECTROSURGICAL) ×3
ELECTRODE REM PT RTRN 9FT ADLT (ELECTROSURGICAL) ×1 IMPLANT
GLOVE BIO SURGEON STRL SZ7 (GLOVE) ×6 IMPLANT
GLOVE BIOGEL PI IND STRL 7.5 (GLOVE) ×1 IMPLANT
GLOVE BIOGEL PI INDICATOR 7.5 (GLOVE) ×2
GOWN STRL REUS W/ TWL LRG LVL3 (GOWN DISPOSABLE) ×2 IMPLANT
GOWN STRL REUS W/TWL LRG LVL3 (GOWN DISPOSABLE) ×6
HEMOSTAT ARISTA ABSORB 3G PWDR (MISCELLANEOUS) IMPLANT
ILLUMINATOR WAVEGUIDE N/F (MISCELLANEOUS) IMPLANT
KIT MARKER MARGIN INK (KITS) ×3 IMPLANT
LIGHT WAVEGUIDE WIDE FLAT (MISCELLANEOUS) IMPLANT
NDL HYPO 25X1 1.5 SAFETY (NEEDLE) ×1 IMPLANT
NDL SAFETY ECLIPSE 18X1.5 (NEEDLE) IMPLANT
NEEDLE HYPO 18GX1.5 SHARP (NEEDLE)
NEEDLE HYPO 25X1 1.5 SAFETY (NEEDLE) ×3 IMPLANT
NS IRRIG 1000ML POUR BTL (IV SOLUTION) IMPLANT
PACK BASIN DAY SURGERY FS (CUSTOM PROCEDURE TRAY) ×3 IMPLANT
PENCIL BUTTON HOLSTER BLD 10FT (ELECTRODE) ×3 IMPLANT
SLEEVE SCD COMPRESS KNEE MED (MISCELLANEOUS) ×3 IMPLANT
SPONGE LAP 4X18 X RAY DECT (DISPOSABLE) ×3 IMPLANT
STRIP CLOSURE SKIN 1/2X4 (GAUZE/BANDAGES/DRESSINGS) ×1 IMPLANT
SUT ETHILON 2 0 FS 18 (SUTURE) IMPLANT
SUT MNCRL AB 4-0 PS2 18 (SUTURE) ×3 IMPLANT
SUT MON AB 5-0 PS2 18 (SUTURE) ×2 IMPLANT
SUT SILK 2 0 SH (SUTURE) ×2 IMPLANT
SUT VIC AB 2-0 SH 27 (SUTURE) ×6
SUT VIC AB 2-0 SH 27XBRD (SUTURE) ×1 IMPLANT
SUT VIC AB 3-0 SH 27 (SUTURE) ×6
SUT VIC AB 3-0 SH 27X BRD (SUTURE) ×1 IMPLANT
SUT VIC AB 5-0 PS2 18 (SUTURE) IMPLANT
SYR CONTROL 10ML LL (SYRINGE) ×3 IMPLANT
TOWEL OR 17X24 6PK STRL BLUE (TOWEL DISPOSABLE) ×3 IMPLANT
TOWEL OR NON WOVEN STRL DISP B (DISPOSABLE) ×3 IMPLANT
TUBE CONNECTING 20'X1/4 (TUBING)
TUBE CONNECTING 20X1/4 (TUBING) IMPLANT
YANKAUER SUCT BULB TIP NO VENT (SUCTIONS) IMPLANT

## 2017-09-11 NOTE — Op Note (Signed)
Preoperative diagnosis: Left breast cancer ,clinical stage I Postoperative diagnosis: same as above Procedure: Leftbreast seed guided lumpectomy Leftdeep axillary sentinel node biopsy Surgeon: Dr Serita Grammes [id number] Anes: general  Specimens  1.left breast tissue marked with paint containing seed with additional anterior and deep margins marked short superior long lateral double deep 2.left axillary sentinel nodes with highest count 836 Complications none Drains none Sponge count correct Dispo to pacu stable  Indications: This is a 47yof with clinical stage I left breast cancer. We discussed all options and elected to proceed with lumpectomy/sn biopsy.   Procedure: After informed consent was obtained the patient was taken to the operating room. She first was given technetium in standard periareolar fashion. She had a pectoral block. She was given antibiotics. Sequential compression devices were on her legs. She was then placed under general anesthesia with an LMA. Then she was prepped and draped in the standard sterile surgical fashion. Surgical timeout was then performed.  I then located the seed in the lateral left breast.I infiltrated marcaine in the skin. I made a periareolar incision to hide the scar. I then used the neoprobe to remove the seed and the surrounding tissue with attempt to get clear margins. I marked this with paint. MM confirmed removal of seed and the clip. I did remove some additional margins as above due to seed being closer to these margins. I placed clips in the cavity.I then obtained hemostasis. This was marked as above.I closed with2-0 vicryl to approximate breast tissue. The skin was closed with 3-0 vicryl and5-0 monocryl. Glue and steristrips were placed.   I then made a low axillary incision after locating the sentinel node.There was radioactivity present that was easily noted. I removed the radioactive nodes containing  technetium. The background radioactivity was minimal.I then obtained hemostasis. I closed the axillary fascia with 2-0 vicryl.The skin was closed with 3-0 vicryl and 4-0 monocryl. Glue and steristrips were applied. She tolerated well, was extubated and transferred to pacu stable.

## 2017-09-11 NOTE — Progress Notes (Signed)
Assisted Dr. Valma Cava with left, ultrasound guided, pectoralis block. Side rails up, monitors on throughout procedure. See vital signs in flow sheet. Tolerated Procedure well.

## 2017-09-11 NOTE — Telephone Encounter (Signed)
Spoke to patient regarding upcoming may appointments per 4/12 sch message.

## 2017-09-11 NOTE — Transfer of Care (Signed)
Immediate Anesthesia Transfer of Care Note  Patient: Connie Clarke  Procedure(s) Performed: LEFT BREAST LUMPECTOMY WITH RADIOACTIVE SEED AND SENTINEL LYMPH NODE BIOPSY (Left Breast)  Patient Location: PACU  Anesthesia Type:General  Level of Consciousness: awake, alert  and oriented  Airway & Oxygen Therapy: Patient Spontanous Breathing and Patient connected to face mask oxygen  Post-op Assessment: Report given to RN and Post -op Vital signs reviewed and stable  Post vital signs: Reviewed and stable  Last Vitals:  Vitals Value Taken Time  BP 111/74 09/11/2017  8:31 AM  Temp    Pulse 107 09/11/2017  8:33 AM  Resp 13 09/11/2017  8:33 AM  SpO2 99 % 09/11/2017  8:33 AM  Vitals shown include unvalidated device data.  Last Pain:  Vitals:   09/11/17 0639  TempSrc: Oral  PainSc: 0-No pain         Complications: No apparent anesthesia complications

## 2017-09-11 NOTE — Discharge Instructions (Signed)
[facility] °Office Phone Number [phone] ° ° POST OP INSTRUCTIONS ° °Always review your discharge instruction sheet given to you by the facility where your surgery was performed. ° °IF YOU HAVE DISABILITY OR FAMILY LEAVE FORMS, YOU MUST BRING THEM TO THE OFFICE FOR PROCESSING.  DO NOT GIVE THEM TO YOUR DOCTOR. ° °1. A prescription for pain medication may be given to you upon discharge.  Take your pain medication as prescribed, if needed.  If narcotic pain medicine is not needed, then you may take acetaminophen (Tylenol), naprosyn (Alleve) or ibuprofen (Advil) as needed. °2. Take your usually prescribed medications unless otherwise directed °3. If you need a refill on your pain medication, please contact your pharmacy.  They will contact our office to request authorization.  Prescriptions will not be filled after 5pm or on week-ends. °4. You should eat very light the first 24 hours after surgery, such as soup, crackers, pudding, etc.  Resume your normal diet the day after surgery. °5. Most patients will experience some swelling and bruising in the breast.  Ice packs and a good support bra will help.  Wear the breast binder provided or a sports bra for 72 hours day and night.  After that wear a sports bra during the day until you return to the office. Swelling and bruising can take several days to resolve.  °6. It is common to experience some constipation if taking pain medication after surgery.  Increasing fluid intake and taking a stool softener will usually help or prevent this problem from occurring.  A mild laxative (Milk of Magnesia or Miralax) should be taken according to package directions if there are no bowel movements after 48 hours. °7. Unless discharge instructions indicate otherwise, you may remove your bandages 48 hours after surgery and you may shower at that time.  You may have steri-strips (small skin tapes) in place directly over the incision.  These strips should be left on the  skin for 7-10 days and will come off on their own.  If your surgeon used skin glue on the incision, you may shower in 24 hours.  The glue will flake off over the next 2-3 weeks.  Any sutures or staples will be removed at the office during your follow-up visit. °8. ACTIVITIES:  You may resume regular daily activities (gradually increasing) beginning the next day.  Wearing a good support bra or sports bra minimizes pain and swelling.  You may have sexual intercourse when it is comfortable. °a. You may drive when you no longer are taking prescription pain medication, you can comfortably wear a seatbelt, and you can safely maneuver your car and apply brakes. °b. RETURN TO WORK:  ______________________________________________________________________________________ °9. You should see your doctor in the office for a follow-up appointment approximately two weeks after your surgery.  Your doctor’s nurse will typically make your follow-up appointment when she calls you with your pathology report.  Expect your pathology report 3-4 business days after your surgery.  You may call to check if you do not hear from us after three days. °10. OTHER INSTRUCTIONS: _______________________________________________________________________________________________ _____________________________________________________________________________________________________________________________________ °_____________________________________________________________________________________________________________________________________ °_____________________________________________________________________________________________________________________________________ ° °WHEN TO CALL DR WAKEFIELD: °1. Fever over 101.0 °2. Nausea and/or vomiting. °3. Extreme swelling or bruising. °4. Continued bleeding from incision. °5. Increased pain, redness, or drainage from the incision. ° °The clinic staff is available to answer your questions during regular  business hours.  Please don’t hesitate to call and ask to speak to one of the nurses for clinical concerns.    If you have a medical emergency, go to the nearest emergency room or call 911.  A surgeon from [facility] is always on call at the hospital. ° °For further questions, please visit [email or web address] ° ° °Post Anesthesia Home Care Instructions ° °Activity: °Get plenty of rest for the remainder of the day. A responsible individual must stay with you for 24 hours following the procedure.  °For the next 24 hours, DO NOT: °-Drive a car °-Operate machinery °-Drink alcoholic beverages °-Take any medication unless instructed by your physician °-Make any legal decisions or sign important papers. ° °Meals: °Start with liquid foods such as gelatin or soup. Progress to regular foods as tolerated. Avoid greasy, spicy, heavy foods. If nausea and/or vomiting occur, drink only clear liquids until the nausea and/or vomiting subsides. Call your physician if vomiting continues. ° °Special Instructions/Symptoms: °Your throat may feel dry or sore from the anesthesia or the breathing tube placed in your throat during surgery. If this causes discomfort, gargle with warm salt water. The discomfort should disappear within 24 hours. ° °If you had a scopolamine patch placed behind your ear for the management of post- operative nausea and/or vomiting: ° °1. The medication in the patch is effective for 72 hours, after which it should be removed.  Wrap patch in a tissue and discard in the trash. Wash hands thoroughly with soap and water. °2. You may remove the patch earlier than 72 hours if you experience unpleasant side effects which may include dry mouth, dizziness or visual disturbances. °3. Avoid touching the patch. Wash your hands with soap and water after contact with the patch. °  °Regional Anesthesia Blocks ° °1. Numbness or the inability to move the "blocked" extremity may last from 3-48 hours after  placement. The length of time depends on the medication injected and your individual response to the medication. If the numbness is not going away after 48 hours, call your surgeon. ° °2. The extremity that is blocked will need to be protected until the numbness is gone and the  Strength has returned. Because you cannot feel it, you will need to take extra care to avoid injury. Because it may be weak, you may have difficulty moving it or using it. You may not know what position it is in without looking at it while the block is in effect. ° °3. For blocks in the legs and feet, returning to weight bearing and walking needs to be done carefully. You will need to wait until the numbness is entirely gone and the strength has returned. You should be able to move your leg and foot normally before you try and bear weight or walk. You will need someone to be with you when you first try to ensure you do not fall and possibly risk injury. ° °4. Bruising and tenderness at the needle site are common side effects and will resolve in a few days. ° °5. Persistent numbness or new problems with movement should be communicated to the surgeon or the [facility] ([phone])/ [facility] ([phone]). °

## 2017-09-11 NOTE — Anesthesia Procedure Notes (Signed)
Procedure Name: LMA Insertion Performed by: Verita Lamb, CRNA Pre-anesthesia Checklist: Patient identified, Emergency Drugs available, Patient being monitored, Suction available and Timeout performed Patient Re-evaluated:Patient Re-evaluated prior to induction Oxygen Delivery Method: Circle system utilized Preoxygenation: Pre-oxygenation with 100% oxygen Induction Type: IV induction Ventilation: Mask ventilation without difficulty LMA: LMA inserted LMA Size: 3.0 Number of attempts: 1 Placement Confirmation: positive ETCO2,  CO2 detector and breath sounds checked- equal and bilateral Tube secured with: Tape

## 2017-09-11 NOTE — H&P (Signed)
62 yof referred by Dr Connie Clarke for left breast cancer. she has no personal history of breast disease and fh only in maternal aunt who was elderly. she had no mass or dc. she underwent screening mm that shows a density breasts. there is a left breast asymmetry. US shows a 5x4x7 mm mass with no axillary adenopathy. core biopsy shows a er/pr pos grade II IDC that is her 2 negative with a Ki of 2%. she is here with her husband to discuss options. she works as pt in [employer].   Past Surgical History Connie Clarke, Connie Clarke; 09/04/2017 4:20 PM) Breast Biopsy  Left. Colon Polyp Removal - Colonoscopy   Diagnostic Studies History Connie Clarke, Connie Clarke; 09/04/2017 4:20 PM) Colonoscopy  5-10 years ago Mammogram  within last year Pap Smear  1-5 years ago  Allergies Connie Clarke, Connie Clarke; 09/04/2017 4:21 PM) No Known Drug Allergies [09/04/2017]:  Medication History (Connie Clarke, Connie Clarke; 09/04/2017 4:22 PM) Lisinopril (10MG  Tablet, Oral) Active. Multivitamin Adults (Oral) Active. Vitamin D (1000UNIT Tablet, Oral) Active. Medications Reconciled  Social History Connie Clarke, Connie Clarke; 09/04/2017 4:20 PM) Alcohol use  Occasional alcohol use. Caffeine use  Coffee, Tea. No drug use  Tobacco use  Former smoker.  Family History Connie Clarke, Connie Clarke; 09/04/2017 4:20 PM) Arthritis  Father, Mother. Cerebrovascular Accident  Mother. Diabetes Mellitus  Father. Heart Disease  Brother, Mother. Hypertension  Sister. Melanoma  Sister. Migraine Headache  Daughter. Prostate Cancer  Brother.  Pregnancy / Birth History Connie Clarke, Connie Clarke; 09/04/2017 4:20 PM) Age at menarche  62 years. Age of menopause  47-50 Contraceptive History  Oral contraceptives. Gravida  6 Length (months) of breastfeeding  3-6 Maternal age  62-35 Para  2  Other Problems Connie Clarke, Connie Clarke; 09/04/2017 4:20 PM) Breast Cancer  Depression  High blood pressure  Hypercholesterolemia  Lump In Breast    Review of Systems (Connie Clarke Connie Clarke; 09/04/2017 4:20 PM) General Not Present- Appetite Loss, Chills, Fatigue, Fever, Night Sweats, Weight Gain and Weight Loss. Skin Not Present- Change in Wart/Mole, Dryness, Hives, Jaundice, New Lesions, Non-Healing Wounds, Rash and Ulcer. HEENT Not Present- Earache, Hearing Loss, Hoarseness, Nose Bleed, Oral Ulcers, Ringing in the Ears, Seasonal Allergies, Sinus Pain, Sore Throat, Visual Disturbances, Wears glasses/contact lenses and Yellow Eyes. Respiratory Not Present- Bloody sputum, Chronic Cough, Difficulty Breathing, Snoring and Wheezing. Breast Present- Breast Mass. Not Present- Breast Pain, Nipple Discharge and Skin Changes. Cardiovascular Not Present- Chest Pain, Difficulty Breathing Lying Down, Leg Cramps, Palpitations, Rapid Heart Rate, Shortness of Breath and Swelling of Extremities. Gastrointestinal Not Present- Abdominal Pain, Bloating, Bloody Stool, Change in Bowel Habits, Chronic diarrhea, Constipation, Difficulty Swallowing, Excessive gas, Gets full quickly at meals, Hemorrhoids, Indigestion, Nausea, Rectal Pain and Vomiting. Female Genitourinary Not Present- Frequency, Nocturia, Painful Urination, Pelvic Pain and Urgency. Musculoskeletal Not Present- Back Pain, Joint Pain, Joint Stiffness, Muscle Pain, Muscle Weakness and Swelling of Extremities. Neurological Not Present- Decreased Memory, Fainting, Headaches, Numbness, Seizures, Tingling, Tremor, Trouble walking and Weakness. Psychiatric Not Present- Anxiety, Bipolar, Change in Sleep Pattern, Depression, Fearful and Frequent crying. Endocrine Not Present- Cold Intolerance, Excessive Hunger, Hair Changes, Heat Intolerance, Hot flashes and New Diabetes. Hematology Not Present- Blood Thinners, Easy Bruising, Excessive bleeding, Gland problems, HIV and Persistent Infections.  Vitals (Connie Clarke Connie Clarke; 09/04/2017 4:21 PM) 09/04/2017 4:21 PM Weight: 196 lb Height: 64in Body Surface Area: 1.94  m Body Mass Index: 33.64 kg/m  Pulse: 84 (Regular)  BP: 142/82 (Sitting, Left Arm, Standard) Physical Exam Connie Bookbinder Connie Clarke; 09/04/2017 5:16 PM)  General Mental Status-Alert. Head and Neck Trachea-midline. Eye Sclera/Conjunctiva - Bilateral-No scleral icterus. Chest and Lung Exam Chest and lung exam reveals -quiet, even and easy respiratory effort with no use of accessory muscles and on auscultation, normal breath sounds, no adventitious sounds and normal vocal resonance. Breast Nipples-No Discharge. Breast Lump-No Palpable Breast Mass. Cardiovascular Cardiovascular examination reveals -normal heart sounds, regular rate and rhythm with no murmurs. Abdomen Palpation/Percussion Liver - Normal. Note: soft Neurologic Neurologic evaluation reveals -alert and oriented x 3 with no impairment of recent or remote memory. Lymphatic Head & Neck General Head & Neck Lymphatics: Bilateral - Description - Normal. Axillary General Axillary Region: Bilateral - Description - Normal. Note: no [place] adenopathy   Assessment & Plan Connie Bookbinder Connie Clarke; 09/04/2017 5:17 PM) STAGE I BREAST CANCER, LEFT (C50.912) Story: Left breast seed guided lumpectomy, left axillary sentinel node biopsy We discussed the staging and pathophysiology of breast cancer. We discussed all of the different options for treatment for breast cancer including surgery, chemotherapy, radiation therapy, Herceptin, and antiestrogen therapy. We discussed a sentinel lymph node biopsy as she does not appear to having lymph node involvement right now. We discussed the performance of that with injection of radioactive tracer. We discussed that there is a chance of having a positive node with a sentinel lymph node biopsy and we will await the permanent pathology to make any other first further decisions in terms of her treatment. One of these options might be to return to the operating room to perform an axillary lymph  node dissection. We discussed up to a 5% risk lifetime of chronic shoulder pain as well as lymphedema associated with a sentinel lymph node biopsy. We discussed the options for treatment of the breast cancer which included lumpectomy versus a mastectomy. We discussed the performance of the lumpectomy with radioactive seed placement. We discussed a 5% chance of a positive margin requiring reexcision in the operating room. We also discussed that she will need radiation therapy if she undergoes lumpectomy. We discussed the mastectomy (removal of whole breast) and the postoperative care for that as well. Mastectomy can be followed by reconstruction. This is a more extensive surgery and requires more recovery. The decision for lumpectomy vs mastectomy has no impact on decision for chemotherapy. Most mastectomy patients will not need radiation therapy. We discussed that there is no difference in her survival whether she undergoes lumpectomy with radiation therapy or antiestrogen therapy versus a mastectomy. There is also no real difference between her recurrence in the breast. We discussed the risks of operation including bleeding, infection, possible reoperation.

## 2017-09-11 NOTE — Progress Notes (Signed)
Nuc med staff performed nuc med inj. Pt tol well with no additional sedation. VSS, see flowsheet. Will call family to bedside and update/provide emotional support

## 2017-09-11 NOTE — Interval H&P Note (Signed)
History and Physical Interval Note:  09/11/2017 7:13 AM  Connie Clarke  has presented today for surgery, with the diagnosis of LEFT BREAST CANCER  The various methods of treatment have been discussed with the patient and family. After consideration of risks, benefits and other options for treatment, the patient has consented to  Procedure(s): LEFT BREAST LUMPECTOMY WITH RADIOACTIVE SEED AND SENTINEL LYMPH NODE BIOPSY (Left) as a surgical intervention .  The patient's history has been reviewed, patient examined, no change in status, stable for surgery.  I have reviewed the patient's chart and labs.  Questions were answered to the patient's satisfaction.     [name]

## 2017-09-11 NOTE — Anesthesia Procedure Notes (Signed)
Anesthesia Regional Block: Pectoralis block   Pre-Anesthetic Checklist: ,, timeout performed, Correct Patient, Correct Site, Correct Laterality, Correct Procedure, Correct Position, site marked, Risks and benefits discussed,  Surgical consent,  Pre-op evaluation,  At surgeon's request and post-op pain management  Laterality: Left  Prep: chloraprep       Needles:  Injection technique: Single-shot  Needle Type: Echogenic Needle     Needle Length: 9cm  Needle Gauge: 21     Additional Needles:   Narrative:  Start time: 09/11/2017 6:58 AM End time: 09/11/2017 7:09 AM Injection made incrementally with aspirations every 5 mL.  Performed by: Personally  Anesthesiologist: Barnet Glasgow, MD

## 2017-09-11 NOTE — Anesthesia Postprocedure Evaluation (Signed)
Anesthesia Post Note  Patient: Connie Clarke  Procedure(s) Performed: LEFT BREAST LUMPECTOMY WITH RADIOACTIVE SEED AND SENTINEL LYMPH NODE BIOPSY (Left Breast)     Patient location during evaluation: PACU Anesthesia Type: General Level of consciousness: awake and alert Pain management: pain level controlled Vital Signs Assessment: post-procedure vital signs reviewed and stable Respiratory status: spontaneous breathing, nonlabored ventilation, respiratory function stable and patient connected to nasal cannula oxygen Cardiovascular status: blood pressure returned to baseline and stable Postop Assessment: no apparent nausea or vomiting Anesthetic complications: no    Last Vitals:  Vitals:   09/11/17 0845 09/11/17 0917  BP: 125/74 133/81  Pulse: (!) 104 92  Resp: 12 18  Temp:  36.5 C  SpO2: 97% 95%    Last Pain:  Vitals:   09/11/17 0917  TempSrc:   PainSc: 0-No pain                 Barnet Glasgow

## 2017-09-14 ENCOUNTER — Encounter (HOSPITAL_BASED_OUTPATIENT_CLINIC_OR_DEPARTMENT_OTHER): Payer: Self-pay | Admitting: General Surgery

## 2017-09-15 ENCOUNTER — Telehealth: Payer: Self-pay | Admitting: *Deleted

## 2017-09-15 NOTE — Telephone Encounter (Signed)
Received order for oncotype testing. Requisition sent to pathology. Received by Keisha 

## 2017-09-17 ENCOUNTER — Ambulatory Visit: Payer: 59

## 2017-09-17 ENCOUNTER — Ambulatory Visit: Payer: 59 | Admitting: Radiation Oncology

## 2017-09-21 ENCOUNTER — Telehealth: Payer: Self-pay | Admitting: Hematology and Oncology

## 2017-09-21 ENCOUNTER — Other Ambulatory Visit: Payer: Self-pay

## 2017-09-21 ENCOUNTER — Ambulatory Visit
Admission: RE | Admit: 2017-09-21 | Discharge: 2017-09-21 | Disposition: A | Discharge status: Home / Self Care | Payer: 59 | Source: Ambulatory Visit | Attending: Radiation Oncology | Admitting: Radiation Oncology

## 2017-09-21 VITALS — BP 153/72 | HR 98 | Temp 97.9°F | Resp 18 | Wt 201.1 lb

## 2017-09-21 DIAGNOSIS — Z9889 Other specified postprocedural states: Secondary | ICD-10-CM | POA: Diagnosis not present

## 2017-09-21 DIAGNOSIS — C50412 Malignant neoplasm of upper-outer quadrant of left female breast: Secondary | ICD-10-CM | POA: Diagnosis not present

## 2017-09-21 DIAGNOSIS — Z170 Estrogen receptor positive status [ER+]: Principal | ICD-10-CM

## 2017-09-21 HISTORY — DX: Malignant neoplasm of unspecified site of left female breast: C50.912

## 2017-09-21 NOTE — Telephone Encounter (Signed)
Spoke to patient regarding upcoming may appointment updates.

## 2017-09-21 NOTE — Progress Notes (Signed)
Radiation Oncology         ([phone]) [phone] ________________________________  Initial Outpatient Consultation  Name: Connie Clarke MRN: [id number]  Date: 09/21/2017  DOB: Oct 22, 1955  GB:TDVVO, Butch Penny, MD  Nicholas Lose, MD   REFERRING PHYSICIAN: Nicholas Lose, MD  DIAGNOSIS: The encounter diagnosis was Malignant neoplasm of upper-outer quadrant of left breast in female, estrogen receptor positive ([place]). Stage IA (pT1b, pN0, pM0) invasive ductal carcinoma of the left breast, ER+, PR+, Her2 -, Grade 2  HISTORY OF PRESENT ILLNESS::Connie Clarke is a 62 y.o. female who presented with a possible asymmetry in the left breast on routine screening mammogram on 08/24/17. Ultrasound on 08/26/17 showed a 5 x 4 x 7 mm irregular hypoechoic mass in the left breast at the 2 o'clock position, 5 cm from the nipple. There was no left axillary adenopathy. Biopsy of the mass on 08/27/17 showed grade II invasive ductal carcinoma, intermediate grade focal ductal carcinoma in situ of the left breast. Receptor status was ER 100%, PR 100%, Her2 negative, and Ki67 2%. The patient underwent left breast lumpectomy on 09/11/17 with Dr. Donne Hazel. Final pathology revealed a 7 mm invasive ductal carcinoma with clear margins. Anterior and deep margin resection was negative for malignancy. The one sampled lymph node was negative for malignancy. Oncotype testing was ordered on 09/15/17. Results are pending. She presents today for evaluation of radiotherapy to the left breast.  The patient reports discomfort at the incisional site. She notes itching along the incision site and reports a skin reaction to the surgical prep and steri-strips. She denies lymphedema. She reports a good range of motion. She notes weight and appetite are stable.  PREVIOUS RADIATION THERAPY: No  PAST MEDICAL HISTORY:  has a past medical history of Anxiety, Breast cancer, left ([place]), Hyperlipidemia, Hypertension, Infertility, female, Joint pain, Palpitations,  and Vitamin D deficiency.    PAST SURGICAL HISTORY: Past Surgical History:  Procedure Laterality Date  . BREAST LUMPECTOMY WITH RADIOACTIVE SEED AND SENTINEL LYMPH NODE BIOPSY Left 09/11/2017   Procedure: LEFT BREAST LUMPECTOMY WITH RADIOACTIVE SEED AND SENTINEL LYMPH NODE BIOPSY;  Surgeon: [name], MD;  Location: [facility];  Service: General;  Laterality: Left;  . DILATION AND CURETTAGE OF UTERUS  1990    FAMILY HISTORY: family history includes Anxiety disorder in her mother; Depression in her father; Diabetes in her father; Hyperlipidemia in her father; Obesity in her father and mother; Stroke in her father and mother.   SOCIAL HISTORY:  reports that she quit smoking about 39 years ago. She has never used smokeless tobacco. She reports that she drinks alcohol. She reports that she does not use drugs. She works as a Community education officer.  ALLERGIES: Chlorhexidine and Steri-strip compound benzoin [benzoin compound]  MEDICATIONS:  Current Outpatient Medications  Medication Sig Dispense Refill  . ALPRAZolam (XANAX) 0.25 MG tablet Take 1 tablet (0.25 mg total) by mouth at bedtime as needed for anxiety. 30 tablet 1  . Cholecalciferol ([employer] VIT D 5000 HIGH-POTENCY) 5000 units capsule Take 1 capsule (5,000 Units total) by mouth daily.    [name] lisinopril (PRINIVIL,ZESTRIL) 10 MG tablet Take 10 mg by mouth every morning.  3  . Multiple Vitamins-Minerals (MULTIVITAMIN WITH MINERALS) tablet Take 1 tablet by mouth daily.     No current facility-administered medications for this encounter.     REVIEW OF SYSTEMS: A 10+ POINT REVIEW OF SYSTEMS WAS OBTAINED including neurology, dermatology, psychiatry, cardiac, respiratory, lymph, extremities, GI, GU, musculoskeletal, constitutional, reproductive, HEENT. All pertinent  positives are noted in the HPI. All others are negative.    PHYSICAL EXAM:  Vitals with BMI 09/21/2017  Height   Weight 201 lbs 2 oz  BMI 97.35  Systolic 329    Diastolic 72  Pulse 98  Respirations 18  General: Alert and oriented, in no acute distress HEENT: Head is normocephalic. Extraocular movements are intact. Oropharynx is clear. Neck: Neck is supple, no palpable cervical or supraclavicular lymphadenopathy. Heart: Regular in rate and rhythm with no murmurs, rubs, or gallops. Chest: Clear to auscultation bilaterally, with no rhonchi, wheezes, or rales. Abdomen: Soft, nontender, nondistended, with no rigidity or guarding. Extremities: No cyanosis or edema. Lymphatics: see Neck Exam Skin: No concerning lesions. Musculoskeletal: symmetric strength and muscle tone throughout. Good range of motion throughout upper extremities.  Neurologic: Cranial nerves II through XII are grossly intact. No obvious focalities. Speech is fluent. Coordination is intact. Psychiatric: Judgment and insight are intact. Affect is appropriate. Breast: Right breast no palpable masses, nipple discharge or bleeding. Left breast periareolar scar is healing well without signs of drainage or infection. The patient has a few steri-strips remaining. She has a separate scar in the upper outer portion of the breast from sentinel lymph node procedure which is also healing well.    ECOG = 0  0 - Asymptomatic (Fully active, able to carry on all predisease activities without restriction)  1 - Symptomatic but completely ambulatory (Restricted in physically strenuous activity but ambulatory and able to carry out work of a light or sedentary nature. For example, light housework, office work)  2 - Symptomatic, <50% in bed during the day (Ambulatory and capable of all self care but unable to carry out any work activities. Up and about more than 50% of waking hours)  3 - Symptomatic, >50% in bed, but not bedbound (Capable of only limited self-care, confined to bed or chair 50% or more of waking hours)  4 - Bedbound (Completely disabled. Cannot carry on any self-care. Totally confined to  bed or chair)  5 - Death   Eustace Pen MM, Creech RH, Tormey DC, et al. [phone]). "Toxicity and response criteria of the [facility]". [place] Oncol. 5 (6): 649-55  LABORATORY DATA:  Lab Results  Component Value Date   WBC 7.3 05/21/2016   HGB 14.7 05/21/2016   HCT 43.4 05/21/2016   MCV 87 05/21/2016   NEUTROABS 4.8 05/21/2016   Lab Results  Component Value Date   NA 141 06/22/2017   K 5.0 06/22/2017   CL 102 06/22/2017   CO2 24 06/22/2017   GLUCOSE 106 (H) 06/22/2017   CREATININE 0.82 06/22/2017   CALCIUM 9.8 06/22/2017      RADIOGRAPHY: Nm Sentinel Node Inj-no Rpt (breast)  Result Date: 09/11/2017 Sulfur colloid was injected by the nuclear medicine technologist for melanoma sentinel node.   Mm Breast Surgical Specimen  Result Date: 09/11/2017 CLINICAL DATA:  Specimen radiograph status post left breast lumpectomy. EXAM: SPECIMEN RADIOGRAPH OF THE LEFT BREAST COMPARISON:  Previous exam(s). FINDINGS: Status post excision of the left breast. The radioactive seed and biopsy marker clip are present, completely intact, and were marked for pathology. These findings were communicated with the OR at 8:05 a.m. IMPRESSION: Specimen radiograph of the left breast. Electronically Signed   By: Ammie Ferrier M.D.   On: 09/11/2017 08:07   US Breast Ltd Uni Left Inc Axilla  Result Date: 08/26/2017 CLINICAL DATA:  Patient recalled from screening for left breast mass. EXAM: DIGITAL DIAGNOSTIC  LEFT MAMMOGRAM WITH CAD AND TOMO ULTRASOUND LEFT BREAST COMPARISON:  Previous exam(s). ACR Breast Density Category b: There are scattered areas of fibroglandular density. FINDINGS: Spot compression CC and MLO tomosynthesis images demonstrate a persistent low-attenuation irregular mass within the upper-outer left breast. Mammographic images were processed with CAD. On physical exam, I palpate no discrete mass within the upper-outer left breast. Targeted ultrasound is performed, showing a  5 x 4 x 7 mm irregular hypoechoic mass left breast 2 o'clock position 5 cm from the nipple. No left axillary adenopathy. IMPRESSION: Suspicious left breast mass. RECOMMENDATION: Stereotactic guided core needle biopsy left breast mass. I have discussed the findings and recommendations with the patient. Results were also provided in writing at the conclusion of the visit. If applicable, a reminder letter will be sent to the patient regarding the next appointment. BI-RADS CATEGORY  4: Suspicious. Electronically Signed   By: Lovey Newcomer M.D.   On: 08/26/2017 14:40   Mm Diag Breast Tomo Uni Left  Result Date: 08/26/2017 CLINICAL DATA:  Patient recalled from screening for left breast mass. EXAM: DIGITAL DIAGNOSTIC LEFT MAMMOGRAM WITH CAD AND TOMO ULTRASOUND LEFT BREAST COMPARISON:  Previous exam(s). ACR Breast Density Category b: There are scattered areas of fibroglandular density. FINDINGS: Spot compression CC and MLO tomosynthesis images demonstrate a persistent low-attenuation irregular mass within the upper-outer left breast. Mammographic images were processed with CAD. On physical exam, I palpate no discrete mass within the upper-outer left breast. Targeted ultrasound is performed, showing a 5 x 4 x 7 mm irregular hypoechoic mass left breast 2 o'clock position 5 cm from the nipple. No left axillary adenopathy. IMPRESSION: Suspicious left breast mass. RECOMMENDATION: Stereotactic guided core needle biopsy left breast mass. I have discussed the findings and recommendations with the patient. Results were also provided in writing at the conclusion of the visit. If applicable, a reminder letter will be sent to the patient regarding the next appointment. BI-RADS CATEGORY  4: Suspicious. Electronically Signed   By: Lovey Newcomer M.D.   On: 08/26/2017 14:40   Mm Screening Breast Tomo Bilateral  Result Date: 08/24/2017 CLINICAL DATA:  Screening. EXAM: DIGITAL SCREENING BILATERAL MAMMOGRAM WITH TOMO AND CAD COMPARISON:   Previous exam(s). ACR Breast Density Category a: The breast tissue is almost entirely fatty. FINDINGS: In the left breast, a possible asymmetry warrants further evaluation. In the right breast, no findings suspicious for malignancy. Images were processed with CAD. IMPRESSION: Further evaluation is suggested for possible asymmetry in the left breast. RECOMMENDATION: Diagnostic mammogram and possibly ultrasound of the left breast. (Code:[id number]-L-33M) The patient will be contacted regarding the findings, and additional imaging will be scheduled. BI-RADS CATEGORY  0: Incomplete. Need additional imaging evaluation and/or prior mammograms for comparison. Electronically Signed   By: Margarette Canada M.D.   On: 08/24/2017 13:12   [name] Lt Radioactive Seed Loc  Result Date: 09/10/2017 CLINICAL DATA:  Patient for preoperative localization prior to left lumpectomy. EXAM: ULTRASOUND GUIDED RADIOACTIVE SEED LOCALIZATION OF THE LEFT BREAST COMPARISON:  Previous exam(s). FINDINGS: Patient presents for radioactive seed localization prior to left lumpectomy. I met with the patient and we discussed the procedure of seed localization including benefits and alternatives. We discussed the high likelihood of a successful procedure. We discussed the risks of the procedure including infection, bleeding, tissue injury and further surgery. We discussed the low dose of radioactivity involved in the procedure. Informed, written consent was given. The usual time-out protocol was performed immediately prior to the procedure. Using ultrasound guidance,  sterile technique, 1% lidocaine and an I-125 radioactive seed, mass and biopsy clip were localized using a lateral approach. The follow-up mammogram images confirm the seed in the expected location and were marked for Dr. Donne Hazel. Follow-up survey of the patient confirms presence of the radioactive seed. Order number of I-125 seed:  [id number]. Total activity:  0.938 millicuries reference Date: 09/07/2017  The patient tolerated the procedure well and was released from the [facility]. She was given instructions regarding seed removal. IMPRESSION: Radioactive seed localization left breast. No apparent complications. Electronically Signed   By: Lovey Newcomer M.D.   On: 09/10/2017 15:11   Mm Clip Placement Left  Result Date: 09/10/2017 CLINICAL DATA:  Status post radioactive seed placement left breast. EXAM: DIAGNOSTIC LEFT MAMMOGRAM POST ULTRASOUND-GUIDED RADIOACTIVE SEED PLACEMENT COMPARISON:  Previous exam(s). FINDINGS: Mammographic images were obtained following ultrasound-guided radioactive seed placement. These demonstrate radioactive seed and biopsy marking clip to be in appropriate position upper-outer left breast. IMPRESSION: Appropriate location of the radioactive seed. Final Assessment: Post Procedure Mammograms for Seed Placement Electronically Signed   By: Lovey Newcomer M.D.   On: 09/10/2017 15:12   Mm Clip Placement Left  Result Date: 08/27/2017 CLINICAL DATA:  Left breast mass.  Evaluate biopsy clip. EXAM: DIAGNOSTIC LEFT MAMMOGRAM POST ULTRASOUND BIOPSY COMPARISON:  Previous exam(s). FINDINGS: Mammographic images were obtained following ultrasound guided biopsy of a left breast mass. The heart shaped biopsy clip is at the site of the biopsied mass. IMPRESSION: Appropriate clip placement as above. Final Assessment: Post Procedure Mammograms for Marker Placement Electronically Signed   By: Dorise Bullion III M.D   On: 08/27/2017 15:43   [name] Lt Breast Bx W Loc Dev 1st Lesion Img Bx Spec US Guide  Addendum Date: 09/08/2017   ADDENDUM REPORT: 08/31/2017 08:08 ADDENDUM: Pathology revealed GRADE II - INVASIVE DUCTAL CARCINOMA, INTERMEDIATE GRADE - FOCAL DUCTAL CARCINOMA IN SITU of Left breast, upper outer quadrant, 2 o'clock, 5 cmfn. This was found to be concordant by Dr. Dorise Bullion. Pathology results were discussed with the patient by telephone. The patient reported doing well after the biopsy with  tenderness at the site. Post biopsy instructions and care were reviewed and questions were answered. The patient was encouraged to call [facility] for any additional concerns. Surgical consultation has been arranged with Dr. [name], per patient request, at [facility] on September 11, 2017. The patient has an appointment with Dr Nicholas Lose, per patient request, on September 01, 2017. Pathology results reported by Roselind Messier, RN on 08/31/2017. Electronically Signed   By: Dorise Bullion III M.D   On: 08/31/2017 08:08   Result Date: 09/08/2017 CLINICAL DATA:  Left breast mass EXAM: ULTRASOUND GUIDED LEFT BREAST CORE NEEDLE BIOPSY COMPARISON:  Previous exam(s). FINDINGS: I met with the patient and we discussed the procedure of ultrasound-guided biopsy, including benefits and alternatives. We discussed the high likelihood of a successful procedure. We discussed the risks of the procedure, including infection, bleeding, tissue injury, clip migration, and inadequate sampling. Informed written consent was given. The usual time-out protocol was performed immediately prior to the procedure. Lesion quadrant: Upper-outer Using sterile technique and 1% Lidocaine as local anesthetic, under direct ultrasound visualization, a 12 gauge spring-loaded device was used to perform biopsy of a left breast mass using a lateral approach. At the conclusion of the procedure a heart shaped tissue marker clip was deployed into the biopsy cavity. Follow up 2 view mammogram was performed and dictated separately.  IMPRESSION: Ultrasound guided biopsy of a left breast mass. No apparent complications. Electronically Signed: By: Dorise Bullion III M.D On: 08/27/2017 15:45      IMPRESSION: Stage IA (pT1b, pN0, pM0) invasive ductal carcinoma of the left breast, ER+, PR+, Her2 -, Grade 2. The patient would be a good candidate for breast conserving treatment. She is status post left breast lumpectomy,  performed on 09/11/17. Oncotype results are pending. We discussed the course of treatment, side effects, and long term toxicities with the patient. She appears to understand and wishes to proceed. The patient appears to be a good candidate for hypofractionated accelerated treatment to the left breast over 4 weeks. A consent form was signed and a copy was provided for the patient.  PLAN: CT simulation and treatment planning will be scheduled for 10/12/17 at 8 AM. Anticipate hypofractionated accelerated treatment to begin 6 weeks post-op.     ------------------------------------------------  Blair Promise, PhD, MD  This document serves as a record of services personally performed by Gery Pray, MD. It was created on his behalf by Bethann Humble, a trained medical scribe. The creation of this record is based on the scribe's personal observations and the provider's statements to them. This document has been checked and approved by the attending provider.

## 2017-09-21 NOTE — Progress Notes (Signed)
Location of Breast Cancer: upper-outer quadrant of left breast   Histology per Pathology Report:   08/27/17 Diagnosis Breast, left, needle core biopsy, UOQ, 2 o'clock, 5cm - INVASIVE DUCTAL CARCINOMA, GRADE II. SEE NOTE. - FOCAL DUCTAL CARCINOMA IN SITU, INTERMEDIATE GRADE, SOLID-TYPE. Diagnosis Note The carcinoma measures 0.6 cm in greatest dimension in the submitted biopsies.  Receptor Status: ER(100%), PR (100%), Her2-neu (negative), Ki-(2%)  Did patient present with symptoms (if so, please note symptoms) or was this found on screening mammography?: screening mammogram  Past/Anticipated interventions by surgeon, if any: 09/11/17 left breast lumpectomy and sentinel lymph node biopsy  Past/Anticipated interventions by medical oncology, if any: Adjuvant antiestrogen therapy to follow radiation.  Lymphedema issues, if any:  No  Pain issues, if any: Discomfort at incisional site   SAFETY ISSUES:  Prior radiation? No  Pacemaker/ICD? No  Possible current pregnancy? No  Is the patient on methotrexate? No  Current Complaints / other details:  Connie Clarke is here today for left breast carcinoma consult. Reports itching and mild discomfort at incisional site on breast (had a skin reaction to the surgical prep and steristrips). Denies any issues with range of motion. States weight and appetite are stable and feels mental status is unchanged

## 2017-09-21 NOTE — Addendum Note (Signed)
Encounter addended by: Zola Button, RN on: 09/21/2017 2:03 PM  Actions taken: Charge Capture section accepted

## 2017-09-22 ENCOUNTER — Telehealth: Payer: Self-pay | Admitting: *Deleted

## 2017-09-22 NOTE — Telephone Encounter (Signed)
Received oncotype score of 20/6%. Physician team notified. Called pt with results. Discussed results and no need for chemotherapy and that next step is xrt. Confirmed SIM with Dr. Sondra Come. Will mail preventative lymphedema sleeve and gauntlet to pt. Denies further needs at this time.

## 2017-09-23 ENCOUNTER — Encounter: Payer: Self-pay | Admitting: General Practice

## 2017-09-23 ENCOUNTER — Encounter (HOSPITAL_COMMUNITY): Payer: Self-pay | Admitting: Hematology and Oncology

## 2017-09-23 NOTE — Progress Notes (Signed)
[facility] Psychosocial Distress Screening Clinical Social Work  Clinical Social Work was referred by distress screening protocol.  The patient scored a 7 on the Psychosocial Distress Thermometer which indicates moderate distress. Clinical Social Worker Edwyna [name] to assess for distress and other psychosocial needs. CSW and patient discussed common feeling and emotions when being diagnosed with cancer, and the importance of support during treatment. CSW informed patient of the support team and support services at [facility]. CSW provided contact information and encouraged patient to call with any questions or concerns.  Spoke w patient by phone, feels that things are "going about as well as they could."  Feels confident in treatment plan and treatment team. 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  Children are young adults, can be supportive for patient.  Mailed information packet re [place] and encouraged patient to recontact as needed for support and resources.   ONCBCN DISTRESS SCREENING 09/21/2017  Screening Type Initial Screening  Distress experienced in past week (1-10) 7  Family Problem type Children  Emotional problem type Depression;Nervousness/Anxiety;Adjusting to illness  Physical Problem type Sleep/insomnia;Skin dry/itchy    Clinical Social Worker follow up needed: No.  If yes, follow up plan:  Edwyna [name], LCSW Clinical Social Worker Phone:  [phone]

## 2017-10-01 ENCOUNTER — Ambulatory Visit: Payer: 59 | Admitting: Hematology and Oncology

## 2017-10-02 DIAGNOSIS — C50411 Malignant neoplasm of upper-outer quadrant of right female breast: Secondary | ICD-10-CM | POA: Diagnosis not present

## 2017-10-07 ENCOUNTER — Inpatient Hospital Stay: Payer: 59 | Attending: Hematology and Oncology | Admitting: Hematology and Oncology

## 2017-10-07 ENCOUNTER — Telehealth: Payer: Self-pay | Admitting: Hematology and Oncology

## 2017-10-07 DIAGNOSIS — Z170 Estrogen receptor positive status [ER+]: Secondary | ICD-10-CM | POA: Insufficient documentation

## 2017-10-07 DIAGNOSIS — C50412 Malignant neoplasm of upper-outer quadrant of left female breast: Secondary | ICD-10-CM | POA: Diagnosis not present

## 2017-10-07 NOTE — Assessment & Plan Note (Signed)
09/11/2017:Left lumpectomy: 0.7 cm IDC 0/1 lymph node negative, margins negative, grade 2, ER 100%, PR 100%, HER-2 negative ratio 1.32, Ki-67 2%, T1 be N0 stage I a Oncotype DX score 20: 6%.risk of recurrence Pathology counseling: I discussed the final pathology report of the patient provided  a copy of this report. I discussed the margins as well as lymph node surgeries. We also discussed the final staging along with previously performed ER/PR and HER-2/neu testing.  Plan: 1. Adjuvant radiation therapy 2. Followed by adjuvant antiestrogen therapy  Patient is planning to get a bone density test by her primary care physician.

## 2017-10-07 NOTE — Telephone Encounter (Signed)
Gave avs and calendar ° °

## 2017-10-07 NOTE — Progress Notes (Signed)
Patient Care Team: Darcus Austin, MD as PCP - General (Family Medicine)  DIAGNOSIS:  Encounter Diagnosis  Name Primary?  . Malignant neoplasm of upper-outer quadrant of left breast in female, estrogen receptor positive ([place])     SUMMARY OF ONCOLOGIC HISTORY:   Malignant neoplasm of upper-outer quadrant of left breast in female, estrogen receptor positive ([place])   08/27/2017 Initial Diagnosis    Screening mammogram left breast showed possible asymmetry.  Targeted ultrasound revealed a 5 x 4 x 7 mm irregular hypoechoic mass 2 o'clock position 5 cm from nipple, no lymphadenopathy, biopsy showed IDC grade 2 with focal DCIS intermediate grade, HER-2 negative ratio 1.32, T1b N0 stage I a clinical stage AJCC 8      09/11/2017 Surgery    Left lumpectomy: 0.7 cm IDC 0/1 lymph node negative, margins negative, grade 2, ER 100%, PR 100%, HER-2 negative ratio 1.32, Ki-67 2%, T1 be N0 stage I a      09/11/2017 Oncotype testing    Oncotype DX recurrence score 20: 6% risk of recurrence with hormone therapy alone       CHIEF COMPLIANT: Follow-up after surgery to discuss pathology report   INTERVAL HISTORY: Connie Clarke is a 62 year old with above-mentioned history of left breast cancer who underwent lumpectomy and is here today to discuss pathology report. she is healing extremely well from recent surgery.  She denies any pain or discomfort in the axilla.  REVIEW OF SYSTEMS:   Constitutional: Denies fevers, chills or abnormal weight loss Eyes: Denies blurriness of vision Ears, nose, mouth, throat, and face: Denies mucositis or sore throat Respiratory: Denies cough, dyspnea or wheezes Cardiovascular: Denies palpitation, chest discomfort Gastrointestinal:  Denies nausea, heartburn or change in bowel habits Skin: Denies abnormal skin rashes Lymphatics: Denies new lymphadenopathy or easy bruising Neurological:Denies numbness, tingling or new weaknesses Behavioral/Psych: Mood is stable, no new  changes  Extremities: No lower extremity edema Breast: Recent left lumpectomy All other systems were reviewed with the patient and are negative.  I have reviewed the past medical history, past surgical history, social history and family history with the patient and they are unchanged from previous note.  ALLERGIES:  is allergic to chlorhexidine and steri-strip compound benzoin [benzoin compound].  MEDICATIONS:  Current Outpatient Medications  Medication Sig Dispense Refill  . ALPRAZolam (XANAX) 0.25 MG tablet Take 1 tablet (0.25 mg total) by mouth at bedtime as needed for anxiety. 30 tablet 1  . Cholecalciferol ([employer] VIT D 5000 HIGH-POTENCY) 5000 units capsule Take 1 capsule (5,000 Units total) by mouth daily.    [name] lisinopril (PRINIVIL,ZESTRIL) 10 MG tablet Take 10 mg by mouth every morning.  3  . Multiple Vitamins-Minerals (MULTIVITAMIN WITH MINERALS) tablet Take 1 tablet by mouth daily.     No current facility-administered medications for this visit.     PHYSICAL EXAMINATION: ECOG PERFORMANCE STATUS: 1 - Symptomatic but completely ambulatory  Vitals:   10/07/17 1516  BP: (!) 154/85  Pulse: 70  Resp: 18  Temp: 98 F (36.7 C)  SpO2: 100%   Filed Weights   10/07/17 1516  Weight: 198 lb 4.8 oz (89.9 kg)    GENERAL:alert, no distress and comfortable SKIN: skin color, texture, turgor are normal, no rashes or significant lesions EYES: normal, Conjunctiva are pink and non-injected, sclera clear OROPHARYNX:no exudate, no erythema and lips, buccal mucosa, and tongue normal  NECK: supple, thyroid normal size, non-tender, without nodularity LYMPH:  no palpable lymphadenopathy in the cervical, axillary or inguinal LUNGS: clear to  auscultation and percussion with normal breathing effort HEART: regular rate & rhythm and no murmurs and no lower extremity edema ABDOMEN:abdomen soft, non-tender and normal bowel sounds MUSCULOSKELETAL:no cyanosis of digits and no clubbing  NEURO: alert &  oriented x 3 with fluent speech, no focal motor/sensory deficits EXTREMITIES: No lower extremity edema  LABORATORY DATA:  I have reviewed the data as listed CMP Latest Ref Rng & Units 06/22/2017 01/05/2017 08/27/2016  Glucose 65 - 99 mg/dL 106(H) 93 89  BUN 8 - 27 mg/dL _0 Creatinine 0.57 - 1.00 mg/dL 0.82 0.85 0.78  Sodium 134 - 144 mmol/L 141 139 141  Potassium 3.5 - 5.2 mmol/L 5.0 4.5 5.2  Chloride 96 - 106 mmol/L 102 103 101  CO2 20 - 29 mmol/L _1 Calcium 8.7 - 10.3 mg/dL 9.8 9.5 9.8  Total Protein 6.0 - 8.5 g/dL 7.0 6.6 6.9  Total Bilirubin 0.0 - 1.2 mg/dL 0.3 0.4 0.5  Alkaline Phos 39 - 117 IU/L 90 84 81  AST 0 - 40 IU/L 29 33 20  ALT 0 - 32 IU/L 55(H) 56(H) 33(H)    Lab Results  Component Value Date   WBC 7.3 05/21/2016   HGB 14.7 05/21/2016   HCT 43.4 05/21/2016   MCV 87 05/21/2016   NEUTROABS 4.8 05/21/2016    ASSESSMENT & PLAN:  Malignant neoplasm of upper-outer quadrant of left breast in female, estrogen receptor positive ([place]) 09/11/2017:Left lumpectomy: 0.7 cm IDC 0/1 lymph node negative, margins negative, grade 2, ER 100%, PR 100%, HER-2 negative ratio 1.32, Ki-67 2%, T1 be N0 stage I a Oncotype DX score 20: 6%.risk of recurrence Pathology counseling: I discussed the final pathology report of the patient provided  a copy of this report. I discussed the margins as well as lymph node surgeries. We also discussed the final staging along with previously performed ER/PR and HER-2/neu testing.  Plan: 1. Adjuvant radiation therapy 2. Followed by adjuvant antiestrogen therapy  Patient is planning to get a bone density test by her primary care physician.     No orders of the defined types were placed in this encounter.  The patient has a good understanding of the overall plan. she agrees with it. she will call with any problems that may develop before the next visit here.   Harriette Ohara, MD 10/07/17

## 2017-10-12 ENCOUNTER — Ambulatory Visit (INDEPENDENT_AMBULATORY_CARE_PROVIDER_SITE_OTHER): Payer: 59 | Admitting: Family Medicine

## 2017-10-12 ENCOUNTER — Ambulatory Visit
Admission: RE | Admit: 2017-10-12 | Discharge: 2017-10-12 | Disposition: A | Discharge status: Home / Self Care | Payer: 59 | Source: Ambulatory Visit | Attending: Radiation Oncology | Admitting: Radiation Oncology

## 2017-10-12 DIAGNOSIS — E669 Obesity, unspecified: Secondary | ICD-10-CM | POA: Diagnosis not present

## 2017-10-12 DIAGNOSIS — Z170 Estrogen receptor positive status [ER+]: Secondary | ICD-10-CM | POA: Diagnosis not present

## 2017-10-12 DIAGNOSIS — I10 Essential (primary) hypertension: Secondary | ICD-10-CM | POA: Diagnosis not present

## 2017-10-12 DIAGNOSIS — E559 Vitamin D deficiency, unspecified: Secondary | ICD-10-CM | POA: Diagnosis not present

## 2017-10-12 DIAGNOSIS — Z0000 Encounter for general adult medical examination without abnormal findings: Secondary | ICD-10-CM | POA: Diagnosis not present

## 2017-10-12 DIAGNOSIS — C50919 Malignant neoplasm of unspecified site of unspecified female breast: Secondary | ICD-10-CM | POA: Diagnosis not present

## 2017-10-12 DIAGNOSIS — C50412 Malignant neoplasm of upper-outer quadrant of left female breast: Secondary | ICD-10-CM | POA: Insufficient documentation

## 2017-10-12 DIAGNOSIS — E7800 Pure hypercholesterolemia, unspecified: Secondary | ICD-10-CM | POA: Diagnosis not present

## 2017-10-12 DIAGNOSIS — Z510 Encounter for antineoplastic radiation therapy: Secondary | ICD-10-CM | POA: Insufficient documentation

## 2017-10-12 DIAGNOSIS — E161 Other hypoglycemia: Secondary | ICD-10-CM | POA: Diagnosis not present

## 2017-10-12 DIAGNOSIS — F329 Major depressive disorder, single episode, unspecified: Secondary | ICD-10-CM | POA: Diagnosis not present

## 2017-10-12 NOTE — Progress Notes (Signed)
  Radiation Oncology         ([phone]) [phone] ________________________________  Name: Connie Clarke MRN: [id number]  Date: 10/12/2017  DOB: 1955-08-01  SIMULATION AND TREATMENT PLANNING NOTE    ICD-10-CM   1. Malignant neoplasm of upper-outer quadrant of left breast in female, estrogen receptor positive ([place]) C50.412    Z17.0     DIAGNOSIS:  Stage IA (pT1b, pN0, pM0) invasive ductal carcinoma of the left breast, ER+, PR+, Her2 -, Grade 2    NARRATIVE:  The patient was brought to the [facility].  Identity was confirmed.  All relevant records and images related to the planned course of therapy were reviewed.  The patient freely provided informed written consent to proceed with treatment after reviewing the details related to the planned course of therapy. The consent form was witnessed and verified by the simulation staff.  Then, the patient was set-up in a stable reproducible  supine position for radiation therapy.  CT images were obtained.  Surface markings were placed.  The CT images were loaded into the planning software.  Then the target and avoidance structures were contoured.  Treatment planning then occurred.  The radiation prescription was entered and confirmed.  Then, I designed and supervised the construction of a total of 3 medically necessary complex treatment devices.  I have requested : 3D Simulation  I have requested a DVH of the following structures: heart lungs, lumpectomy cavity.  I have ordered:CBC  PLAN:  The patient will receive 40.05 Gy in 15 fractions followed by a boost to the lumpectomy cavity of 10 Gy in 5 fractions.  Special treatment procedure was performed today due to the extra time and effort required by myself to plan and prepare this patient for deep inspiration breath hold technique.  I have determined cardiac sparing to be of benefit to this patient to prevent long term cardiac damage due to radiation of the heart.  Bellows were placed on the  patient's abdomen. To facilitate cardiac sparing, the patient was coached by the radiation therapists on breath hold techniques and breathing practice was performed. Practice waveforms were obtained. The patient was then scanned while maintaining breath hold in the treatment position.  This image was then transferred over to the imaging specialist. The imaging specialist then created a fusion of the free breathing and breath hold scans using the chest wall as the stable structure. I personally reviewed the fusion in axial, coronal and sagittal image planes.  Excellent cardiac sparing was obtained.  I felt the patient is an appropriate candidate for breath hold and the patient will be treated as such.  The image fusion was then reviewed with the patient to reinforce the necessity of reproducible breath hold.   Optical Surface Tracking Plan:  Since intensity modulated radiotherapy (IMRT) and 3D conformal radiation treatment methods are predicated on accurate and precise positioning for treatment, intrafraction motion monitoring is medically necessary to ensure accurate and safe treatment delivery.  The ability to quantify intrafraction motion without excessive ionizing radiation dose can only be performed with optical surface tracking. Accordingly, surface imaging offers the opportunity to obtain 3D measurements of patient position throughout IMRT and 3D treatments without excessive radiation exposure.  I am ordering optical surface tracking for this patient's upcoming course of radiotherapy. _______________________________  -----------------------------------  Blair Promise, PhD, MD

## 2017-10-15 DIAGNOSIS — Z170 Estrogen receptor positive status [ER+]: Secondary | ICD-10-CM | POA: Diagnosis not present

## 2017-10-15 DIAGNOSIS — Z510 Encounter for antineoplastic radiation therapy: Secondary | ICD-10-CM | POA: Diagnosis not present

## 2017-10-15 DIAGNOSIS — C50412 Malignant neoplasm of upper-outer quadrant of left female breast: Secondary | ICD-10-CM | POA: Diagnosis not present

## 2017-10-19 ENCOUNTER — Ambulatory Visit
Admission: RE | Admit: 2017-10-19 | Discharge: 2017-10-19 | Disposition: A | Discharge status: Home / Self Care | Payer: 59 | Source: Ambulatory Visit | Attending: Radiation Oncology | Admitting: Radiation Oncology

## 2017-10-19 DIAGNOSIS — C50412 Malignant neoplasm of upper-outer quadrant of left female breast: Secondary | ICD-10-CM | POA: Diagnosis not present

## 2017-10-19 DIAGNOSIS — Z510 Encounter for antineoplastic radiation therapy: Secondary | ICD-10-CM | POA: Diagnosis not present

## 2017-10-19 DIAGNOSIS — Z170 Estrogen receptor positive status [ER+]: Secondary | ICD-10-CM | POA: Diagnosis not present

## 2017-10-19 MED FILL — ALPRAZolam 0.25 MG TABS: 0.25 | 30 days supply | Qty: 30 | Fill #0

## 2017-10-20 ENCOUNTER — Ambulatory Visit
Admission: RE | Admit: 2017-10-20 | Discharge: 2017-10-20 | Disposition: A | Discharge status: Home / Self Care | Payer: 59 | Source: Ambulatory Visit | Attending: Radiation Oncology | Admitting: Radiation Oncology

## 2017-10-20 DIAGNOSIS — Z170 Estrogen receptor positive status [ER+]: Principal | ICD-10-CM

## 2017-10-20 DIAGNOSIS — C50412 Malignant neoplasm of upper-outer quadrant of left female breast: Secondary | ICD-10-CM | POA: Diagnosis not present

## 2017-10-20 DIAGNOSIS — Z510 Encounter for antineoplastic radiation therapy: Secondary | ICD-10-CM | POA: Diagnosis not present

## 2017-10-20 MED ORDER — RADIAPLEXRX EX GEL
Freq: Two times a day (BID) | CUTANEOUS | Status: DC
  Administered 2017-10-20: 10:00:00 via TOPICAL

## 2017-10-20 MED FILL — LISINOPRIL 10 MG TABLET: 10 | 90 days supply | Qty: 90 | Fill #0

## 2017-10-21 ENCOUNTER — Ambulatory Visit
Admission: RE | Admit: 2017-10-21 | Discharge: 2017-10-21 | Disposition: A | Discharge status: Home / Self Care | Payer: 59 | Source: Ambulatory Visit | Attending: Radiation Oncology | Admitting: Radiation Oncology

## 2017-10-21 DIAGNOSIS — Z510 Encounter for antineoplastic radiation therapy: Secondary | ICD-10-CM | POA: Diagnosis not present

## 2017-10-21 DIAGNOSIS — Z170 Estrogen receptor positive status [ER+]: Secondary | ICD-10-CM | POA: Diagnosis not present

## 2017-10-21 DIAGNOSIS — C50412 Malignant neoplasm of upper-outer quadrant of left female breast: Secondary | ICD-10-CM | POA: Diagnosis not present

## 2017-10-22 ENCOUNTER — Ambulatory Visit
Admission: RE | Admit: 2017-10-22 | Discharge: 2017-10-22 | Disposition: A | Discharge status: Home / Self Care | Payer: 59 | Source: Ambulatory Visit | Attending: Radiation Oncology | Admitting: Radiation Oncology

## 2017-10-22 DIAGNOSIS — Z170 Estrogen receptor positive status [ER+]: Secondary | ICD-10-CM | POA: Diagnosis not present

## 2017-10-22 DIAGNOSIS — Z510 Encounter for antineoplastic radiation therapy: Secondary | ICD-10-CM | POA: Diagnosis not present

## 2017-10-22 DIAGNOSIS — C50412 Malignant neoplasm of upper-outer quadrant of left female breast: Secondary | ICD-10-CM | POA: Diagnosis not present

## 2017-10-23 ENCOUNTER — Ambulatory Visit
Admission: RE | Admit: 2017-10-23 | Discharge: 2017-10-23 | Disposition: A | Discharge status: Home / Self Care | Payer: 59 | Source: Ambulatory Visit | Attending: Radiation Oncology | Admitting: Radiation Oncology

## 2017-10-23 DIAGNOSIS — Z510 Encounter for antineoplastic radiation therapy: Secondary | ICD-10-CM | POA: Diagnosis not present

## 2017-10-23 DIAGNOSIS — C50412 Malignant neoplasm of upper-outer quadrant of left female breast: Secondary | ICD-10-CM | POA: Diagnosis not present

## 2017-10-23 DIAGNOSIS — Z170 Estrogen receptor positive status [ER+]: Secondary | ICD-10-CM | POA: Diagnosis not present

## 2017-10-27 ENCOUNTER — Other Ambulatory Visit (HOSPITAL_COMMUNITY): Payer: Self-pay | Admitting: Obstetrics and Gynecology

## 2017-10-27 ENCOUNTER — Ambulatory Visit
Admission: RE | Admit: 2017-10-27 | Discharge: 2017-10-27 | Disposition: A | Discharge status: Home / Self Care | Payer: 59 | Source: Ambulatory Visit | Attending: Radiation Oncology | Admitting: Radiation Oncology

## 2017-10-27 DIAGNOSIS — Z170 Estrogen receptor positive status [ER+]: Principal | ICD-10-CM

## 2017-10-27 DIAGNOSIS — C50412 Malignant neoplasm of upper-outer quadrant of left female breast: Secondary | ICD-10-CM | POA: Diagnosis not present

## 2017-10-27 DIAGNOSIS — Z01419 Encounter for gynecological examination (general) (routine) without abnormal findings: Secondary | ICD-10-CM | POA: Diagnosis not present

## 2017-10-27 DIAGNOSIS — N83201 Unspecified ovarian cyst, right side: Secondary | ICD-10-CM

## 2017-10-27 DIAGNOSIS — Z510 Encounter for antineoplastic radiation therapy: Secondary | ICD-10-CM | POA: Diagnosis not present

## 2017-10-27 DIAGNOSIS — Z6833 Body mass index (BMI) 33.0-33.9, adult: Secondary | ICD-10-CM | POA: Diagnosis not present

## 2017-10-27 DIAGNOSIS — Z853 Personal history of malignant neoplasm of breast: Secondary | ICD-10-CM | POA: Diagnosis not present

## 2017-10-27 DIAGNOSIS — Z1212 Encounter for screening for malignant neoplasm of rectum: Secondary | ICD-10-CM | POA: Diagnosis not present

## 2017-10-27 MED ORDER — ALRA NON-METALLIC DEODORANT (RAD-ONC)
1.0000 "application " | Freq: Once | TOPICAL | Status: AC
  Administered 2017-10-27: 1 via TOPICAL

## 2017-10-28 ENCOUNTER — Ambulatory Visit
Admission: RE | Admit: 2017-10-28 | Discharge: 2017-10-28 | Disposition: A | Discharge status: Home / Self Care | Payer: 59 | Source: Ambulatory Visit | Attending: Radiation Oncology | Admitting: Radiation Oncology

## 2017-10-28 DIAGNOSIS — Z510 Encounter for antineoplastic radiation therapy: Secondary | ICD-10-CM | POA: Diagnosis not present

## 2017-10-28 DIAGNOSIS — C50412 Malignant neoplasm of upper-outer quadrant of left female breast: Secondary | ICD-10-CM | POA: Diagnosis not present

## 2017-10-28 DIAGNOSIS — Z170 Estrogen receptor positive status [ER+]: Secondary | ICD-10-CM | POA: Diagnosis not present

## 2017-10-29 ENCOUNTER — Ambulatory Visit
Admission: RE | Admit: 2017-10-29 | Discharge: 2017-10-29 | Disposition: A | Discharge status: Home / Self Care | Payer: 59 | Source: Ambulatory Visit | Attending: Radiation Oncology | Admitting: Radiation Oncology

## 2017-10-29 DIAGNOSIS — C50412 Malignant neoplasm of upper-outer quadrant of left female breast: Secondary | ICD-10-CM | POA: Diagnosis not present

## 2017-10-29 DIAGNOSIS — Z170 Estrogen receptor positive status [ER+]: Secondary | ICD-10-CM | POA: Diagnosis not present

## 2017-10-29 DIAGNOSIS — Z510 Encounter for antineoplastic radiation therapy: Secondary | ICD-10-CM | POA: Diagnosis not present

## 2017-10-30 ENCOUNTER — Ambulatory Visit (HOSPITAL_COMMUNITY)
Admission: RE | Admit: 2017-10-30 | Discharge: 2017-10-30 | Disposition: A | Discharge status: Home / Self Care | Payer: 59 | Source: Ambulatory Visit | Attending: Obstetrics and Gynecology | Admitting: Obstetrics and Gynecology

## 2017-10-30 ENCOUNTER — Encounter (HOSPITAL_COMMUNITY): Payer: Self-pay | Admitting: Radiology

## 2017-10-30 ENCOUNTER — Ambulatory Visit
Admission: RE | Admit: 2017-10-30 | Discharge: 2017-10-30 | Disposition: A | Discharge status: Home / Self Care | Payer: 59 | Source: Ambulatory Visit | Attending: Radiation Oncology | Admitting: Radiation Oncology

## 2017-10-30 DIAGNOSIS — N83201 Unspecified ovarian cyst, right side: Secondary | ICD-10-CM | POA: Insufficient documentation

## 2017-10-30 DIAGNOSIS — N200 Calculus of kidney: Secondary | ICD-10-CM | POA: Insufficient documentation

## 2017-10-30 DIAGNOSIS — Z510 Encounter for antineoplastic radiation therapy: Secondary | ICD-10-CM | POA: Diagnosis not present

## 2017-10-30 DIAGNOSIS — Z170 Estrogen receptor positive status [ER+]: Secondary | ICD-10-CM | POA: Diagnosis not present

## 2017-10-30 DIAGNOSIS — C50412 Malignant neoplasm of upper-outer quadrant of left female breast: Secondary | ICD-10-CM | POA: Diagnosis not present

## 2017-10-30 DIAGNOSIS — C50912 Malignant neoplasm of unspecified site of left female breast: Secondary | ICD-10-CM | POA: Insufficient documentation

## 2017-10-30 MED ORDER — IOPAMIDOL (ISOVUE-300) INJECTION 61%
100.0000 mL | Freq: Once | INTRAVENOUS | Status: AC | PRN
  Administered 2017-10-30: 100 mL via INTRAVENOUS

## 2017-10-30 MED ORDER — IOPAMIDOL (ISOVUE-300) INJECTION 61%
INTRAVENOUS | Status: AC
  Filled 2017-10-30: qty 100

## 2017-11-02 ENCOUNTER — Ambulatory Visit
Admission: RE | Admit: 2017-11-02 | Discharge: 2017-11-02 | Disposition: A | Discharge status: Home / Self Care | Payer: 59 | Source: Ambulatory Visit | Attending: Radiation Oncology | Admitting: Radiation Oncology

## 2017-11-02 DIAGNOSIS — Z801 Family history of malignant neoplasm of trachea, bronchus and lung: Secondary | ICD-10-CM | POA: Diagnosis not present

## 2017-11-02 DIAGNOSIS — Z170 Estrogen receptor positive status [ER+]: Secondary | ICD-10-CM | POA: Diagnosis not present

## 2017-11-02 DIAGNOSIS — C50412 Malignant neoplasm of upper-outer quadrant of left female breast: Secondary | ICD-10-CM | POA: Diagnosis not present

## 2017-11-02 DIAGNOSIS — Z803 Family history of malignant neoplasm of breast: Secondary | ICD-10-CM | POA: Diagnosis not present

## 2017-11-02 DIAGNOSIS — Z808 Family history of malignant neoplasm of other organs or systems: Secondary | ICD-10-CM | POA: Diagnosis not present

## 2017-11-02 DIAGNOSIS — Z510 Encounter for antineoplastic radiation therapy: Secondary | ICD-10-CM | POA: Diagnosis not present

## 2017-11-02 DIAGNOSIS — Z8042 Family history of malignant neoplasm of prostate: Secondary | ICD-10-CM | POA: Diagnosis not present

## 2017-11-02 DIAGNOSIS — Z13820 Encounter for screening for osteoporosis: Secondary | ICD-10-CM | POA: Diagnosis not present

## 2017-11-02 DIAGNOSIS — C50912 Malignant neoplasm of unspecified site of left female breast: Secondary | ICD-10-CM | POA: Diagnosis not present

## 2017-11-03 ENCOUNTER — Ambulatory Visit
Admission: RE | Admit: 2017-11-03 | Discharge: 2017-11-03 | Disposition: A | Discharge status: Home / Self Care | Payer: 59 | Source: Ambulatory Visit | Attending: Radiation Oncology | Admitting: Radiation Oncology

## 2017-11-03 DIAGNOSIS — Z170 Estrogen receptor positive status [ER+]: Secondary | ICD-10-CM | POA: Diagnosis not present

## 2017-11-03 DIAGNOSIS — Z510 Encounter for antineoplastic radiation therapy: Secondary | ICD-10-CM | POA: Diagnosis not present

## 2017-11-03 DIAGNOSIS — C50412 Malignant neoplasm of upper-outer quadrant of left female breast: Secondary | ICD-10-CM | POA: Diagnosis not present

## 2017-11-04 ENCOUNTER — Ambulatory Visit
Admission: RE | Admit: 2017-11-04 | Discharge: 2017-11-04 | Disposition: A | Discharge status: Home / Self Care | Payer: 59 | Source: Ambulatory Visit | Attending: Radiation Oncology | Admitting: Radiation Oncology

## 2017-11-04 DIAGNOSIS — Z170 Estrogen receptor positive status [ER+]: Secondary | ICD-10-CM | POA: Diagnosis not present

## 2017-11-04 DIAGNOSIS — C50412 Malignant neoplasm of upper-outer quadrant of left female breast: Secondary | ICD-10-CM | POA: Diagnosis not present

## 2017-11-04 DIAGNOSIS — Z510 Encounter for antineoplastic radiation therapy: Secondary | ICD-10-CM | POA: Diagnosis not present

## 2017-11-05 ENCOUNTER — Telehealth: Payer: Self-pay | Admitting: Gynecologic Oncology

## 2017-11-05 ENCOUNTER — Ambulatory Visit
Admission: RE | Admit: 2017-11-05 | Discharge: 2017-11-05 | Disposition: A | Discharge status: Home / Self Care | Payer: 59 | Source: Ambulatory Visit | Attending: Radiation Oncology | Admitting: Radiation Oncology

## 2017-11-05 DIAGNOSIS — C50412 Malignant neoplasm of upper-outer quadrant of left female breast: Secondary | ICD-10-CM | POA: Diagnosis not present

## 2017-11-05 DIAGNOSIS — Z510 Encounter for antineoplastic radiation therapy: Secondary | ICD-10-CM | POA: Diagnosis not present

## 2017-11-05 DIAGNOSIS — Z170 Estrogen receptor positive status [ER+]: Secondary | ICD-10-CM | POA: Diagnosis not present

## 2017-11-05 NOTE — Telephone Encounter (Signed)
Left message for new patient referrals coordinator about patient's upcoming appointment on June 11 at 2pm with Dr. Denman George.  Advised to please inform the patient to arrive early to register, that she will have a pelvic examination, and about valet parking.  Also advised to please let the patient know that this is a surgery day for Dr. Denman George so the time could be subjected to change.

## 2017-11-06 ENCOUNTER — Ambulatory Visit
Admission: RE | Admit: 2017-11-06 | Discharge: 2017-11-06 | Disposition: A | Discharge status: Home / Self Care | Payer: 59 | Source: Ambulatory Visit | Attending: Radiation Oncology | Admitting: Radiation Oncology

## 2017-11-06 ENCOUNTER — Encounter: Payer: Self-pay | Admitting: Hematology and Oncology

## 2017-11-06 DIAGNOSIS — C50412 Malignant neoplasm of upper-outer quadrant of left female breast: Secondary | ICD-10-CM | POA: Diagnosis not present

## 2017-11-06 DIAGNOSIS — Z170 Estrogen receptor positive status [ER+]: Secondary | ICD-10-CM | POA: Diagnosis not present

## 2017-11-06 DIAGNOSIS — Z510 Encounter for antineoplastic radiation therapy: Secondary | ICD-10-CM | POA: Diagnosis not present

## 2017-11-09 ENCOUNTER — Ambulatory Visit
Admission: RE | Admit: 2017-11-09 | Discharge: 2017-11-09 | Disposition: A | Discharge status: Home / Self Care | Payer: 59 | Source: Ambulatory Visit | Attending: Radiation Oncology | Admitting: Radiation Oncology

## 2017-11-09 DIAGNOSIS — Z510 Encounter for antineoplastic radiation therapy: Secondary | ICD-10-CM | POA: Diagnosis not present

## 2017-11-09 DIAGNOSIS — C50412 Malignant neoplasm of upper-outer quadrant of left female breast: Secondary | ICD-10-CM | POA: Diagnosis not present

## 2017-11-09 DIAGNOSIS — Z170 Estrogen receptor positive status [ER+]: Secondary | ICD-10-CM | POA: Diagnosis not present

## 2017-11-10 ENCOUNTER — Ambulatory Visit: Payer: 59 | Admitting: Radiation Oncology

## 2017-11-10 ENCOUNTER — Ambulatory Visit
Admission: RE | Admit: 2017-11-10 | Discharge: 2017-11-10 | Disposition: A | Discharge status: Home / Self Care | Payer: 59 | Source: Ambulatory Visit | Attending: Radiation Oncology | Admitting: Radiation Oncology

## 2017-11-10 ENCOUNTER — Inpatient Hospital Stay: Payer: 59 | Attending: Hematology and Oncology | Admitting: Gynecologic Oncology

## 2017-11-10 ENCOUNTER — Encounter: Payer: Self-pay | Admitting: Gynecologic Oncology

## 2017-11-10 VITALS — BP 145/67 | HR 77 | Temp 97.9°F | Resp 20 | Ht 64.0 in | Wt 197.0 lb

## 2017-11-10 DIAGNOSIS — Z923 Personal history of irradiation: Secondary | ICD-10-CM | POA: Insufficient documentation

## 2017-11-10 DIAGNOSIS — Z510 Encounter for antineoplastic radiation therapy: Secondary | ICD-10-CM | POA: Diagnosis not present

## 2017-11-10 DIAGNOSIS — D3910 Neoplasm of uncertain behavior of unspecified ovary: Secondary | ICD-10-CM | POA: Insufficient documentation

## 2017-11-10 DIAGNOSIS — Z170 Estrogen receptor positive status [ER+]: Secondary | ICD-10-CM | POA: Insufficient documentation

## 2017-11-10 DIAGNOSIS — N838 Other noninflammatory disorders of ovary, fallopian tube and broad ligament: Secondary | ICD-10-CM

## 2017-11-10 DIAGNOSIS — C50412 Malignant neoplasm of upper-outer quadrant of left female breast: Secondary | ICD-10-CM | POA: Diagnosis not present

## 2017-11-10 DIAGNOSIS — Z853 Personal history of malignant neoplasm of breast: Secondary | ICD-10-CM | POA: Insufficient documentation

## 2017-11-10 NOTE — Patient Instructions (Signed)
Preparing for your Surgery  Plan for surgery on November 23, 2017 with Dr. Everitt Amber at [facility] will be scheduled for a robotic assisted total hysterectomy, bilateral salpingo-oophorectomy, possible staging.  Pre-operative Testing -You will receive a phone call from presurgical testing at [facility] to arrange for a pre-operative testing appointment before your surgery.  This appointment normally occurs one to two weeks before your scheduled surgery.   -Bring your insurance card, copy of an advanced directive if applicable, medication list  -At that visit, you will be asked to sign a consent for a possible blood transfusion in case a transfusion becomes necessary during surgery.  The need for a blood transfusion is rare but having consent is a necessary part of your care.     -You should not be taking blood thinners or aspirin at least ten days prior to surgery unless instructed by your surgeon.  Day Before Surgery at [place] will be asked to take in a light diet the day before surgery.  Avoid carbonated beverages.  You will be advised to have nothing to eat or drink after midnight the evening before.    Eat a light diet the day before surgery.  Examples including soups, broths, toast, yogurt, mashed potatoes.  Things to avoid include carbonated beverages (fizzy beverages), raw fruits and raw vegetables, or beans.   If your bowels are filled with gas, your surgeon will have difficulty visualizing your pelvic organs which increases your surgical risks.  Your role in recovery Your role is to become active as soon as directed by your doctor, while still giving yourself time to heal.  Rest when you feel tired. You will be asked to do the following in order to speed your recovery:  - Cough and breathe deeply. This helps toclear and expand your lungs and can prevent pneumonia. You may be given a spirometer to practice deep breathing. A staff member will show  you how to use the spirometer. - Do mild physical activity. Walking or moving your legs help your circulation and body functions return to normal. A staff member will help you when you try to walk and will provide you with simple exercises. Do not try to get up or walk alone the first time. - Actively manage your pain. Managing your pain lets you move in comfort. We will ask you to rate your pain on a scale of zero to 10. It is your responsibility to tell your doctor or nurse where and how much you hurt so your pain can be treated.  Special Considerations -If you are diabetic, you may be placed on insulin after surgery to have closer control over your blood sugars to promote healing and recovery.  This does not mean that you will be discharged on insulin.  If applicable, your oral antidiabetics will be resumed when you are tolerating a solid diet.  -Your final pathology results from surgery should be available by the Friday after surgery and the results will be relayed to you when available.  -Dr. Lahoma Crocker is the Surgeon that assists your GYN Oncologist with surgery.  The next day after your surgery you will either see your GYN Oncologist or Dr. Lahoma Crocker.   Blood Transfusion Information WHAT IS A BLOOD TRANSFUSION? A transfusion is the replacement of blood or some of its parts. Blood is made up of multiple cells which provide different functions.  Red blood cells carry oxygen and are used for blood loss replacement.  White blood cells fight against infection.  Platelets control bleeding.  Plasma helps clot blood.  Other blood products are available for specialized needs, such as hemophilia or other clotting disorders. BEFORE THE TRANSFUSION  Who gives blood for transfusions?   You may be able to donate blood to be used at a later date on yourself (autologous donation).  Relatives can be asked to donate blood. This is generally not any safer than if you have received  blood from a stranger. The same precautions are taken to ensure safety when a relative's blood is donated.  Healthy volunteers who are fully evaluated to make sure their blood is safe. This is blood bank blood. Transfusion therapy is the safest it has ever been in the practice of medicine. Before blood is taken from a donor, a complete history is taken to make sure that person has no history of diseases nor engages in risky social behavior (examples are intravenous drug use or sexual activity with multiple partners). The donor's travel history is screened to minimize risk of transmitting infections, such as malaria. The donated blood is tested for signs of infectious diseases, such as HIV and hepatitis. The blood is then tested to be sure it is compatible with you in order to minimize the chance of a transfusion reaction. If you or a relative donates blood, this is often done in anticipation of surgery and is not appropriate for emergency situations. It takes many days to process the donated blood. RISKS AND COMPLICATIONS Although transfusion therapy is very safe and saves many lives, the main dangers of transfusion include:   Getting an infectious disease.  Developing a transfusion reaction. This is an allergic reaction to something in the blood you were given. Every precaution is taken to prevent this. The decision to have a blood transfusion has been considered carefully by your caregiver before blood is given. Blood is not given unless the benefits outweigh the risks.

## 2017-11-10 NOTE — Progress Notes (Signed)
Consult Note: Gyn-Onc  Consult was requested by Dr. Helane Rima for the evaluation of Connie Clarke 62 y.o. female  CC:  Chief Complaint  Patient presents with  . Ovarian mass    Assessment/Plan:  Connie Clarke  is a 62 y.o.  year old with an 8cm cystic right ovarian mass and a recent history of ER/PR positive breast cancer (s/p surgery, radiation to be completed 11/17/17).  I performed a history, physical examination, and personally reviewed the patient's imaging films including the CT abd/pelvis from 11/04/17.  I discussed that based on the appearance of the mass and the normal tumor marker, I have a low suspicion for ovarian cancer.  However given the complex nature of the mass in a postmenopausal woman I am recommending surgical excision.  Given her hormone receptor positive breast cancer history I am recommending BSO as part of her surgery.  We discussed the potential role for hysterectomy at the time of surgery.  Given that there is a potential for prescription of tamoxifen if she does not tolerate the aromatase inhibitor, to prevent an increased risk for uterine cancer the patient is electing for hysterectomy as part of her surgery, though she does understand that hysterectomy is associated with an increased risk of perioperative complications and increased postoperative recovery.  We discussed route of surgery which I am recommending to be a robotic assisted total hysterectomy BSO.  We discussed anticipated postoperative recovery and restrictions.  I discussed perioperative risks including  bleeding, infection, damage to internal organs (such as bladder,ureters, bowels), blood clot, reoperation and rehospitalization.  She is already undergone genetic screening however this test result is not yet resulted.  I discussed that radiation has some impact on tissue healing and fatigue levels, therefore I am recommending not performing surgery while she is undergoing radiation.  She would like to  hasten her surgery to as soon as possible and we have agreed to surgery 1 week after completion of radiation.   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 Runell Gess for right ovarian cystic mass in the setting of her recent breast cancer diagnosis.  The patient was diagnosed with an ER PR positive breast cancer in March 2019.  This was treated with surgical excision with Dr. Donne Hazel followed by chest wall radiation with Dr. Sondra Come.  The tumor was  a stage Ib tumor.  She is currently undergoing chest wall radiation for this.  She was seen and evaluated by Dr. Helane Rima for routine gynecologic care on Oct 27, 2017.  At that time a pelvic cystic mass was appreciated on examination.  This prompted her OB/GYN to perform an in office transvaginal ultrasound scan which was performed on 10/27/2017.  This revealed a uterus measuring 6.5 x 3.3 x 4.1 cm with an endometrial thickness of 5.4 mm.  The right ovary measured 7.5 x 5 x 5.3 cm and contain multiple cystic areas with question of blood flow in a small attached nodule.  The left ovary was then normal limits.  No free fluid was seen.  She then underwent a CT scan of the abdomen and pelvis on 10/30/2017, and this confirmed a 7.5 cm complex cystic lesion in the right adnexa which is indeterminate but probably benign characteristics.  No omental disease, ascites, lymphadenopathy, or carcinomatosis was identified.  Ca1 25 was drawn on 10/28/2017 and this was normal at 5.  The patient has a past medical history of taking  bioidentical hormones including estradiol patch and progesterone between 2015 and 2018.  She also use compounded testosterone occasionally during that time.  She has a history of an aunt with breast cancer diagnosis in her 12s and also died of pancreatic cancer.  She has a brother who developed metastatic prostate cancer in his 52s and another brother who developed prostate cancer in  his 50s.  She has an allergy to a surgical preparation that was used during her breast cancer surgery.   Current Meds:  Outpatient Encounter Medications as of 11/10/2017  Medication Sig  . ALPRAZolam (XANAX) 0.25 MG tablet Take 1 tablet (0.25 mg total) by mouth at bedtime as needed for anxiety.  . Cholecalciferol ([employer] VIT D 5000 HIGH-POTENCY) 5000 units capsule Take 1 capsule (5,000 Units total) by mouth daily.  [name] lisinopril (PRINIVIL,ZESTRIL) 10 MG tablet Take 10 mg by mouth every morning.  . Multiple Vitamins-Minerals (MULTIVITAMIN WITH MINERALS) tablet Take 1 tablet by mouth daily.   No facility-administered encounter medications on file as of 11/10/2017.     Allergy:  Allergies  Allergen Reactions  . Chlorhexidine Hives    Surgical prep scrub  . Steri-Strip Compound Benzoin [Benzoin Compound] Hives    Social Hx:   Social History   Socioeconomic History  . Marital status: Married    Spouse name: Not on file  . Number of children: Not on file  . Years of education: Not on file  . Highest education level: Not on file  Occupational History  . Occupation: Physical Diplomatic Services operational officer: [employer]  Social Needs  . Financial resource strain: Not on file  . Food insecurity:    Worry: Not on file    Inability: Not on file  . Transportation needs:    Medical: Not on file    Non-medical: Not on file  Tobacco Use  . Smoking status: Former Smoker    Last attempt to quit: 1980    Years since quitting: 39.4  . Smokeless tobacco: Never Used  Substance and Sexual Activity  . Alcohol use: Yes    Comment: social  . Drug use: Never  . Sexual activity: Not on file  Lifestyle  . Physical activity:    Days per week: Not on file    Minutes per session: Not on file  . Stress: Not on file  Relationships  . Social connections:    Talks on phone: Not on file    Gets together: Not on file    Attends religious service: Not on file    Active member of club or organization: Not on  file    Attends meetings of clubs or organizations: Not on file    Relationship status: Not on file  . Intimate partner violence:    Fear of current or ex partner: Not on file    Emotionally abused: Not on file    Physically abused: Not on file    Forced sexual activity: Not on file  Other Topics Concern  . Not on file  Social History Narrative  . Not on file    Past Surgical Hx:  Past Surgical History:  Procedure Laterality Date  . BREAST LUMPECTOMY WITH RADIOACTIVE SEED AND SENTINEL LYMPH NODE BIOPSY Left 09/11/2017   Procedure: LEFT BREAST LUMPECTOMY WITH RADIOACTIVE SEED AND SENTINEL LYMPH NODE BIOPSY;  Surgeon: [name], MD;  Location: [facility];  Service: General;  Laterality: Left;  . DILATION AND CURETTAGE OF UTERUS  1990    Past  Medical Hx:  Past Medical History:  Diagnosis Date  . Anxiety   . Breast cancer, left ([place])   . Hyperlipidemia   . Hypertension   . Infertility, female   . Joint pain   . Palpitations   . Vitamin D deficiency     Past Gynecological History:  SVDx 2 Patient's last menstrual period was 06/02/2005 (approximate).  Family Hx:  Family History  Problem Relation Age of Onset  . Stroke Mother   . Anxiety disorder Mother   . Obesity Mother   . Diabetes Father   . Hyperlipidemia Father   . Stroke Father   . Depression Father   . Obesity Father   . Melanoma Sister   . Prostate cancer Brother   . Breast cancer Neg Hx     Review of Systems:  Constitutional  Feels well,    ENT Normal appearing ears and nares bilaterally Skin/Breast  No rash, sores, jaundice, itching, dryness Cardiovascular  No chest pain, shortness of breath, or edema  Pulmonary  No cough or wheeze.  Gastro Intestinal  No nausea, vomitting, or diarrhoea. No bright red blood per rectum, no abdominal pain, change in bowel movement, or constipation.  Genito Urinary  No frequency, urgency, dysuria, no bleeding Musculo Skeletal  No myalgia,  arthralgia, joint swelling or pain  Neurologic  No weakness, numbness, change in gait,  Psychology  No depression, anxiety, insomnia.   Vitals:  Blood pressure (!) 145/67, pulse 77, temperature 97.9 F (36.6 C), temperature source Oral, resp. rate 20, height 5\' 4"  (1.626 m), weight 197 lb (89.4 kg), last menstrual period 06/02/2005, SpO2 100 %.  Physical Exam: WD in NAD Neck  Supple NROM, without any enlargements.  Lymph Node Survey No cervical supraclavicular or inguinal adenopathy Cardiovascular  Pulse normal rate, regularity and rhythm. S1 and S2 normal.  Lungs  Clear to auscultation bilateraly, without wheezes/crackles/rhonchi. Good air movement.  Skin  No rash/lesions/breakdown  Psychiatry  Alert and oriented to person, place, and time  Abdomen  Normoactive bowel sounds, abdomen soft, non-tender and overweight without evidence of hernia.  Back No CVA tenderness Genito Urinary  Vulva/vagina: Normal external female genitalia.   No lesions. No discharge or bleeding.  Bladder/urethra:  No lesions or masses, well supported bladder  Vagina: no lesions  Cervix: Normal appearing, no lesions.  Uterus:  Small, mobile, no parametrial involvement or nodularity.  Adnexa: smooth cystic fullness appreciated in right pelvis (mobile)  Rectal  deferred Extremities  No bilateral cyanosis, clubbing or edema.   Thereasa Solo, MD  11/10/2017, 5:12 PM

## 2017-11-10 NOTE — H&P (View-Only) (Signed)
Consult Note: Gyn-Onc  Consult was requested by Dr. Helane Rima for the evaluation of Connie Clarke 62 y.o. female  CC:  Chief Complaint  Patient presents with  . Ovarian mass    Assessment/Plan:  Connie Clarke  is a 62 y.o.  year old with an 8cm cystic right ovarian mass and a recent history of ER/PR positive breast cancer (s/p surgery, radiation to be completed 11/17/17).  I performed a history, physical examination, and personally reviewed the patient's imaging films including the CT abd/pelvis from 11/04/17.  I discussed that based on the appearance of the mass and the normal tumor marker, I have a low suspicion for ovarian cancer.  However given the complex nature of the mass in a postmenopausal woman I am recommending surgical excision.  Given her hormone receptor positive breast cancer history I am recommending BSO as part of her surgery.  We discussed the potential role for hysterectomy at the time of surgery.  Given that there is a potential for prescription of tamoxifen if she does not tolerate the aromatase inhibitor, to prevent an increased risk for uterine cancer the patient is electing for hysterectomy as part of her surgery, though she does understand that hysterectomy is associated with an increased risk of perioperative complications and increased postoperative recovery.  We discussed route of surgery which I am recommending to be a robotic assisted total hysterectomy BSO.  We discussed anticipated postoperative recovery and restrictions.  I discussed perioperative risks including  bleeding, infection, damage to internal organs (such as bladder,ureters, bowels), blood clot, reoperation and rehospitalization.  She is already undergone genetic screening however this test result is not yet resulted.  I discussed that radiation has some impact on tissue healing and fatigue levels, therefore I am recommending not performing surgery while she is undergoing radiation.  She would like to  hasten her surgery to as soon as possible and we have agreed to surgery 1 week after completion of radiation.   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 Runell Gess for right ovarian cystic mass in the setting of her recent breast cancer diagnosis.  The patient was diagnosed with an ER PR positive breast cancer in March 2019.  This was treated with surgical excision with Dr. Donne Hazel followed by chest wall radiation with Dr. Sondra Come.  The tumor was  a stage Ib tumor.  She is currently undergoing chest wall radiation for this.  She was seen and evaluated by Dr. Helane Rima for routine gynecologic care on Oct 27, 2017.  At that time a pelvic cystic mass was appreciated on examination.  This prompted her OB/GYN to perform an in office transvaginal ultrasound scan which was performed on 10/27/2017.  This revealed a uterus measuring 6.5 x 3.3 x 4.1 cm with an endometrial thickness of 5.4 mm.  The right ovary measured 7.5 x 5 x 5.3 cm and contain multiple cystic areas with question of blood flow in a small attached nodule.  The left ovary was then normal limits.  No free fluid was seen.  She then underwent a CT scan of the abdomen and pelvis on 10/30/2017, and this confirmed a 7.5 cm complex cystic lesion in the right adnexa which is indeterminate but probably benign characteristics.  No omental disease, ascites, lymphadenopathy, or carcinomatosis was identified.  Ca1 25 was drawn on 10/28/2017 and this was normal at 5.  The patient has a past medical history of taking  bioidentical hormones including estradiol patch and progesterone between 2015 and 2018.  She also use compounded testosterone occasionally during that time.  She has a history of an aunt with breast cancer diagnosis in her 46s and also died of pancreatic cancer.  She has a brother who developed metastatic prostate cancer in his 46s and another brother who developed prostate cancer in  his 58s.  She has an allergy to a surgical preparation that was used during her breast cancer surgery.   Current Meds:  Outpatient Encounter Medications as of 11/10/2017  Medication Sig  . ALPRAZolam (XANAX) 0.25 MG tablet Take 1 tablet (0.25 mg total) by mouth at bedtime as needed for anxiety.  . Cholecalciferol ([employer]S VIT D 5000 HIGH-POTENCY) 5000 units capsule Take 1 capsule (5,000 Units total) by mouth daily.  [name] lisinopril (PRINIVIL,ZESTRIL) 10 MG tablet Take 10 mg by mouth every morning.  . Multiple Vitamins-Minerals (MULTIVITAMIN WITH MINERALS) tablet Take 1 tablet by mouth daily.   No facility-administered encounter medications on file as of 11/10/2017.     Allergy:  Allergies  Allergen Reactions  . Chlorhexidine Hives    Surgical prep scrub  . Steri-Strip Compound Benzoin [Benzoin Compound] Hives    Social Hx:   Social History   Socioeconomic History  . Marital status: Married    Spouse name: Not on file  . Number of children: Not on file  . Years of education: Not on file  . Highest education level: Not on file  Occupational History  . Occupation: Physical Diplomatic Services operational officer: [employer]  Social Needs  . Financial resource strain: Not on file  . Food insecurity:    Worry: Not on file    Inability: Not on file  . Transportation needs:    Medical: Not on file    Non-medical: Not on file  Tobacco Use  . Smoking status: Former Smoker    Last attempt to quit: 1980    Years since quitting: 39.4  . Smokeless tobacco: Never Used  Substance and Sexual Activity  . Alcohol use: Yes    Comment: social  . Drug use: Never  . Sexual activity: Not on file  Lifestyle  . Physical activity:    Days per week: Not on file    Minutes per session: Not on file  . Stress: Not on file  Relationships  . Social connections:    Talks on phone: Not on file    Gets together: Not on file    Attends religious service: Not on file    Active member of club or organization: Not on  file    Attends meetings of clubs or organizations: Not on file    Relationship status: Not on file  . Intimate partner violence:    Fear of current or ex partner: Not on file    Emotionally abused: Not on file    Physically abused: Not on file    Forced sexual activity: Not on file  Other Topics Concern  . Not on file  Social History Narrative  . Not on file    Past Surgical Hx:  Past Surgical History:  Procedure Laterality Date  . BREAST LUMPECTOMY WITH RADIOACTIVE SEED AND SENTINEL LYMPH NODE BIOPSY Left 09/11/2017   Procedure: LEFT BREAST LUMPECTOMY WITH RADIOACTIVE SEED AND SENTINEL LYMPH NODE BIOPSY;  Surgeon: [name], MD;  Location: [facility];  Service: General;  Laterality: Left;  . DILATION AND CURETTAGE OF UTERUS  1990    Past  Medical Hx:  Past Medical History:  Diagnosis Date  . Anxiety   . Breast cancer, left ([place])   . Hyperlipidemia   . Hypertension   . Infertility, female   . Joint pain   . Palpitations   . Vitamin D deficiency     Past Gynecological History:  SVDx 2 Patient's last menstrual period was 06/02/2005 (approximate).  Family Hx:  Family History  Problem Relation Age of Onset  . Stroke Mother   . Anxiety disorder Mother   . Obesity Mother   . Diabetes Father   . Hyperlipidemia Father   . Stroke Father   . Depression Father   . Obesity Father   . Melanoma Sister   . Prostate cancer Brother   . Breast cancer Neg Hx     Review of Systems:  Constitutional  Feels well,    ENT Normal appearing ears and nares bilaterally Skin/Breast  No rash, sores, jaundice, itching, dryness Cardiovascular  No chest pain, shortness of breath, or edema  Pulmonary  No cough or wheeze.  Gastro Intestinal  No nausea, vomitting, or diarrhoea. No bright red blood per rectum, no abdominal pain, change in bowel movement, or constipation.  Genito Urinary  No frequency, urgency, dysuria, no bleeding Musculo Skeletal  No myalgia,  arthralgia, joint swelling or pain  Neurologic  No weakness, numbness, change in gait,  Psychology  No depression, anxiety, insomnia.   Vitals:  Blood pressure (!) 145/67, pulse 77, temperature 97.9 F (36.6 C), temperature source Oral, resp. rate 20, height 5\' 4"  (1.626 m), weight 197 lb (89.4 kg), last menstrual period 06/02/2005, SpO2 100 %.  Physical Exam: WD in NAD Neck  Supple NROM, without any enlargements.  Lymph Node Survey No cervical supraclavicular or inguinal adenopathy Cardiovascular  Pulse normal rate, regularity and rhythm. S1 and S2 normal.  Lungs  Clear to auscultation bilateraly, without wheezes/crackles/rhonchi. Good air movement.  Skin  No rash/lesions/breakdown  Psychiatry  Alert and oriented to person, place, and time  Abdomen  Normoactive bowel sounds, abdomen soft, non-tender and overweight without evidence of hernia.  Back No CVA tenderness Genito Urinary  Vulva/vagina: Normal external female genitalia.   No lesions. No discharge or bleeding.  Bladder/urethra:  No lesions or masses, well supported bladder  Vagina: no lesions  Cervix: Normal appearing, no lesions.  Uterus:  Small, mobile, no parametrial involvement or nodularity.  Adnexa: smooth cystic fullness appreciated in right pelvis (mobile)  Rectal  deferred Extremities  No bilateral cyanosis, clubbing or edema.   Thereasa Solo, MD  11/10/2017, 5:12 PM

## 2017-11-11 ENCOUNTER — Inpatient Hospital Stay (HOSPITAL_BASED_OUTPATIENT_CLINIC_OR_DEPARTMENT_OTHER): Payer: 59 | Admitting: Hematology and Oncology

## 2017-11-11 ENCOUNTER — Ambulatory Visit
Admission: RE | Admit: 2017-11-11 | Discharge: 2017-11-11 | Disposition: A | Discharge status: Home / Self Care | Payer: 59 | Source: Ambulatory Visit | Attending: Radiation Oncology | Admitting: Radiation Oncology

## 2017-11-11 ENCOUNTER — Telehealth: Payer: Self-pay | Admitting: Hematology and Oncology

## 2017-11-11 DIAGNOSIS — Z170 Estrogen receptor positive status [ER+]: Principal | ICD-10-CM

## 2017-11-11 DIAGNOSIS — C50412 Malignant neoplasm of upper-outer quadrant of left female breast: Secondary | ICD-10-CM

## 2017-11-11 DIAGNOSIS — D3910 Neoplasm of uncertain behavior of unspecified ovary: Secondary | ICD-10-CM | POA: Diagnosis not present

## 2017-11-11 DIAGNOSIS — Z510 Encounter for antineoplastic radiation therapy: Secondary | ICD-10-CM | POA: Diagnosis not present

## 2017-11-11 DIAGNOSIS — Z853 Personal history of malignant neoplasm of breast: Secondary | ICD-10-CM

## 2017-11-11 DIAGNOSIS — Z923 Personal history of irradiation: Secondary | ICD-10-CM | POA: Diagnosis not present

## 2017-11-11 MED ORDER — LETROZOLE 2.5 MG PO TABS: 2.5000 mg | ORAL_TABLET | Freq: Every day | ORAL | 3 refills | Status: DC

## 2017-11-11 MED FILL — LETROZOLE 2.5 MG TABLET: 2.5 | 90 days supply | Qty: 90 | Fill #0

## 2017-11-11 NOTE — Progress Notes (Signed)
Patient Care Team: Darcus Austin, MD as PCP - General (Family Medicine)  DIAGNOSIS:  Encounter Diagnosis  Name Primary?  . Malignant neoplasm of upper-outer quadrant of left breast in female, estrogen receptor positive ([place])     SUMMARY OF ONCOLOGIC HISTORY:   Malignant neoplasm of upper-outer quadrant of left breast in female, estrogen receptor positive ([place])   08/27/2017 Initial Diagnosis    Screening mammogram left breast showed possible asymmetry.  Targeted ultrasound revealed a 5 x 4 x 7 mm irregular hypoechoic mass 2 o'clock position 5 cm from nipple, no lymphadenopathy, biopsy showed IDC grade 2 with focal DCIS intermediate grade, HER-2 negative ratio 1.32, T1b N0 stage I a clinical stage AJCC 8      09/11/2017 Surgery    Left lumpectomy: 0.7 cm IDC 0/1 lymph node negative, margins negative, grade 2, ER 100%, PR 100%, HER-2 negative ratio 1.32, Ki-67 2%, T1 be N0 stage I a      09/11/2017 Oncotype testing    Oncotype DX recurrence score 20: 6% risk of recurrence with hormone therapy alone       CHIEF COMPLIANT: Follow-up on near completion of radiation therapy  INTERVAL HISTORY: Connie Clarke is a 62 year old with above-mentioned history of left breast cancer treated with lumpectomy and is currently undergoing adjuvant radiation therapy.  She was noted to have a large ovarian cyst and is seeing Dr. Denman George who plans to do bilateral nephrectomy on 11/23/2017.  She may elect to undergo hysterectomy as well.  She was seen by Dr. Tressia Danas who performed genetic analysis and we are awaiting those results.  REVIEW OF SYSTEMS:   Constitutional: Denies fevers, chills or abnormal weight loss Eyes: Denies blurriness of vision Ears, nose, mouth, throat, and face: Denies mucositis or sore throat Respiratory: Denies cough, dyspnea or wheezes Cardiovascular: Denies palpitation, chest discomfort Gastrointestinal:  Denies nausea, heartburn or change in bowel habits Skin: Denies abnormal skin  rashes Lymphatics: Denies new lymphadenopathy or easy bruising Neurological:Denies numbness, tingling or new weaknesses Behavioral/Psych: Mood is stable, no new changes  Extremities: No lower extremity edema Breast: Mild radiation dermatitis All other systems were reviewed with the patient and are negative.  I have reviewed the past medical history, past surgical history, social history and family history with the patient and they are unchanged from previous note.  ALLERGIES:  is allergic to chlorhexidine and steri-strip compound benzoin [benzoin compound].  MEDICATIONS:  Current Outpatient Medications  Medication Sig Dispense Refill  . ALPRAZolam (XANAX) 0.25 MG tablet Take 1 tablet (0.25 mg total) by mouth at bedtime as needed for anxiety. 30 tablet 1  . Cholecalciferol ([employer] VIT D 5000 HIGH-POTENCY) 5000 units capsule Take 1 capsule (5,000 Units total) by mouth daily.    Derrill Memo ON 12/19/2017] letrozole (FEMARA) 2.5 MG tablet Take 1 tablet (2.5 mg total) by mouth daily. 90 tablet 3  . lisinopril (PRINIVIL,ZESTRIL) 10 MG tablet Take 10 mg by mouth every morning.  3  . Multiple Vitamins-Minerals (MULTIVITAMIN WITH MINERALS) tablet Take 1 tablet by mouth daily.     No current facility-administered medications for this visit.     PHYSICAL EXAMINATION: ECOG PERFORMANCE STATUS: 1 - Symptomatic but completely ambulatory  Vitals:   11/11/17 1509  BP: (!) 131/52  Pulse: 72  Resp: 20  Temp: 97.6 F (36.4 C)  SpO2: 99%   Filed Weights   11/11/17 1509  Weight: 197 lb 14.4 oz (89.8 kg)    GENERAL:alert, no distress and comfortable SKIN: skin color, texture,  turgor are normal, no rashes or significant lesions EYES: normal, Conjunctiva are pink and non-injected, sclera clear OROPHARYNX:no exudate, no erythema and lips, buccal mucosa, and tongue normal  NECK: supple, thyroid normal size, non-tender, without nodularity LYMPH:  no palpable lymphadenopathy in the cervical, axillary or  inguinal LUNGS: clear to auscultation and percussion with normal breathing effort HEART: regular rate & rhythm and no murmurs and no lower extremity edema ABDOMEN:abdomen soft, non-tender and normal bowel sounds MUSCULOSKELETAL:no cyanosis of digits and no clubbing  NEURO: alert & oriented x 3 with fluent speech, no focal motor/sensory deficits EXTREMITIES: No lower extremity edema  LABORATORY DATA:  I have reviewed the data as listed CMP Latest Ref Rng & Units 06/22/2017 01/05/2017 08/27/2016  Glucose 65 - 99 mg/dL 106(H) 93 89  BUN 8 - 27 mg/dL _0 Creatinine 0.57 - 1.00 mg/dL 0.82 0.85 0.78  Sodium 134 - 144 mmol/L 141 139 141  Potassium 3.5 - 5.2 mmol/L 5.0 4.5 5.2  Chloride 96 - 106 mmol/L 102 103 101  CO2 20 - 29 mmol/L _1 Calcium 8.7 - 10.3 mg/dL 9.8 9.5 9.8  Total Protein 6.0 - 8.5 g/dL 7.0 6.6 6.9  Total Bilirubin 0.0 - 1.2 mg/dL 0.3 0.4 0.5  Alkaline Phos 39 - 117 IU/L 90 84 81  AST 0 - 40 IU/L 29 33 20  ALT 0 - 32 IU/L 55(H) 56(H) 33(H)    Lab Results  Component Value Date   WBC 7.3 05/21/2016   HGB 14.7 05/21/2016   HCT 43.4 05/21/2016   MCV 87 05/21/2016   NEUTROABS 4.8 05/21/2016    ASSESSMENT & PLAN:  Malignant neoplasm of upper-outer quadrant of left breast in female, estrogen receptor positive ([facility]) 09/11/2017:Left lumpectomy: 0.7 cm IDC 0/1 lymph node negative, margins negative, grade 2, ER 100%, PR 100%, HER-2 negative ratio 1.32, Ki-67 2%, T1 be N0 stage I a Oncotype DX score 20: 6%.risk of recurrence Pathology counseling: I discussed the final pathology report of the patient provided  a copy of this report. I discussed the margins as well as lymph node surgeries. We also discussed the final staging along with previously performed ER/PR and HER-2/neu testing.  Plan: 1. Adjuvant radiation therapy 4 more days of radiation left 2. Followed by adjuvant antiestrogen therapy with letrozole 2.5 mg daily to start 12/19/2017  Bone density done at Dr.  Rolin Barry office showed a T score of -1.31 October 2017  I explained to the patient that in spite of getting an oophorectomy she will need antiestrogen therapy and we went over the risks and benefits of letrozole.  I sent a prescription for letrozole. She will come back in 3 months for survivorship care plan visit with Mendel Ryder.     No orders of the defined types were placed in this encounter.  The patient has a good understanding of the overall plan. she agrees with it. she will call with any problems that may develop before the next visit here.   Harriette Ohara, MD 11/11/17

## 2017-11-11 NOTE — Assessment & Plan Note (Signed)
09/11/2017:Left lumpectomy: 0.7 cm IDC 0/1 lymph node negative, margins negative, grade 2, ER 100%, PR 100%, HER-2 negative ratio 1.32, Ki-67 2%, T1 be N0 stage I a Oncotype DX score 20: 6%.risk of recurrence Pathology counseling: I discussed the final pathology report of the patient provided  a copy of this report. I discussed the margins as well as lymph node surgeries. We also discussed the final staging along with previously performed ER/PR and HER-2/neu testing.  Plan: 1. Adjuvant radiation therapy 4 more days of radiation left 2. Followed by adjuvant antiestrogen therapy with letrozole 2.5 mg daily to start 12/19/2017  Bone density done at Dr. Rolin Barry office showed a T score of -1.31 October 2017  I explained to the patient that in spite of getting an oophorectomy she will need antiestrogen therapy and we went over the risks and benefits of letrozole.  I sent a prescription for letrozole. She will come back in 3 months for survivorship care plan visit with Mendel Ryder.

## 2017-11-11 NOTE — Telephone Encounter (Signed)
Patient declined avs and calendar  °

## 2017-11-12 ENCOUNTER — Ambulatory Visit
Admission: RE | Admit: 2017-11-12 | Discharge: 2017-11-12 | Disposition: A | Discharge status: Home / Self Care | Payer: 59 | Source: Ambulatory Visit | Attending: Radiation Oncology | Admitting: Radiation Oncology

## 2017-11-12 DIAGNOSIS — C50412 Malignant neoplasm of upper-outer quadrant of left female breast: Secondary | ICD-10-CM | POA: Diagnosis not present

## 2017-11-12 DIAGNOSIS — Z510 Encounter for antineoplastic radiation therapy: Secondary | ICD-10-CM | POA: Diagnosis not present

## 2017-11-12 DIAGNOSIS — Z170 Estrogen receptor positive status [ER+]: Secondary | ICD-10-CM | POA: Diagnosis not present

## 2017-11-13 ENCOUNTER — Ambulatory Visit: Payer: 59

## 2017-11-16 ENCOUNTER — Ambulatory Visit
Admission: RE | Admit: 2017-11-16 | Discharge: 2017-11-16 | Disposition: A | Discharge status: Home / Self Care | Payer: 59 | Source: Ambulatory Visit | Attending: Radiation Oncology | Admitting: Radiation Oncology

## 2017-11-16 DIAGNOSIS — Z170 Estrogen receptor positive status [ER+]: Secondary | ICD-10-CM | POA: Diagnosis not present

## 2017-11-16 DIAGNOSIS — Z510 Encounter for antineoplastic radiation therapy: Secondary | ICD-10-CM | POA: Diagnosis not present

## 2017-11-16 DIAGNOSIS — C50412 Malignant neoplasm of upper-outer quadrant of left female breast: Secondary | ICD-10-CM | POA: Diagnosis not present

## 2017-11-16 NOTE — Patient Instructions (Addendum)
Connie Clarke  11/16/2017   Your procedure is scheduled on: 11-23-17   Report to [facility] Main  Entrance    Report to admitting at 6:00AM    Call this number if you have problems the morning of surgery [phone]     Remember: Eat a light diet the day before surgery. Examples including soups, broths, toast, yogurt, mashed potatoes. Things to avoid include carbonated beverages (fizzy beverages), raw fruits and raw vegetables, or beans.   Only clear liquids after midnight (see list below). Nothing by mouth 3 hours prior to scheduled surgery. Please finish carbohydrate drink 3 hours prior to surgery at 5:30AM     [place] Excluded  Coffee and tea, regular and decaf                             liquids that you cannot  Plain Jell-O in any flavor                                             see through such as: Fruit ices (not with fruit pulp)                                     milk, soups, orange juice  Iced Popsicles                                    All solid food Carbonated beverages, regular and diet                                    Cranberry, grape and apple juices Sports drinks like Gatorade Lightly seasoned clear broth or consume(fat free) Sugar, honey syrup  Sample Menu Breakfast                                Lunch                                     Supper Cranberry juice                    Beef broth                            Chicken broth Jell-O  Grape juice                           Apple juice Coffee or tea                        Jell-O                                      Popsicle                                                Coffee or tea                        Coffee or tea  _____________________________________________________________________     Take these medicines the morning of surgery  with A SIP OF WATER: tylenol if needed, XANAX IF NEEDED                                You may not have any metal on your body including hair pins and              piercings  Do not wear jewelry, make-up, lotions, powders or perfumes, deodorant             Do not wear nail polish.  Do not shave  48 hours prior to surgery.     Do not bring valuables to the hospital. [place].  Contacts, dentures or bridgework may not be worn into surgery.  Leave suitcase in the car. After surgery it may be brought to your room.     Patients discharged the day of surgery will not be allowed to drive home.  Name and phone number of your driver:  Special Instructions: N/A               Please read over the following fact sheets you were given: _____________________________________________________________________             [facility] - Preparing for Surgery Before surgery, you can play an important role.  Because skin is not sterile, your skin needs to be as free of germs as possible.  You can reduce the number of germs on your skin by washing with CHG (chlorahexidine gluconate) soap before surgery.  CHG is an antiseptic cleaner which kills germs and bonds with the skin to continue killing germs even after washing. Please DO NOT use if you have an allergy to CHG or antibacterial soaps.  If your skin becomes reddened/irritated stop using the CHG and inform your nurse when you arrive at Short Stay. Do not shave (including legs and underarms) for at least 48 hours prior to the first CHG shower.  You may shave your face/neck.    Please follow these instructions carefully: FOR CHLORHEXIDINE ALLERGY. PLEASE USE GOLD DIAL ANTIBACTERIAL BAR SOAP FOR SURGERY SHOWERS!  1.  Shower with CHG Soap the night before surgery and the  morning of Surgery.  2.  If you choose to wash your hair, wash your hair first as usual  with your  normal  shampoo.  3.  After you  shampoo, rinse your hair and body thoroughly to remove the  shampoo.                           4.  Use CHG as you would any other liquid soap.  You can apply chg directly  to the skin and wash                       Gently with a scrungie or clean washcloth.  5.  Apply the CHG Soap to your body ONLY FROM THE NECK DOWN.   Do not use on face/ open                           Wound or open sores. Avoid contact with eyes, ears mouth and genitals (private parts).                       Wash face,  Genitals (private parts) with your normal soap.             6.  Wash thoroughly, paying special attention to the area where your surgery  will be performed.  7.  Thoroughly rinse your body with warm water from the neck down.  8.  DO NOT shower/wash with your normal soap after using and rinsing off  the CHG Soap.                9.  Pat yourself dry with a clean towel.            10.  Wear clean pajamas.            11.  Place clean sheets on your bed the night of your first shower and do not  sleep with pets. Day of Surgery : Do not apply any lotions/deodorants the morning of surgery.  Please wear clean clothes to the hospital/surgery center.  FAILURE TO FOLLOW THESE INSTRUCTIONS MAY RESULT IN THE CANCELLATION OF YOUR SURGERY PATIENT SIGNATURE_________________________________  NURSE SIGNATURE__________________________________  ________________________________________________________________________   Connie Clarke  An incentive spirometer is a tool that can help keep your lungs clear and active. This tool measures how well you are filling your lungs with each breath. Taking long deep breaths may help reverse or decrease the chance of developing breathing (pulmonary) problems (especially infection) following:  A long period of time when you are unable to move or be active. BEFORE THE PROCEDURE   If the spirometer includes an indicator to show your best effort, your nurse or respiratory therapist  will set it to a desired goal.  If possible, sit up straight or lean slightly forward. Try not to slouch.  Hold the incentive spirometer in an upright position. INSTRUCTIONS FOR USE  1. Sit on the edge of your bed if possible, or sit up as far as you can in bed or on a chair. 2. Hold the incentive spirometer in an upright position. 3. Breathe out normally. 4. Place the mouthpiece in your mouth and seal your lips tightly around it. 5. Breathe in slowly and as deeply as possible, raising the piston or the ball toward the top of the column. 6. Hold your breath for 3-5 seconds or for as long as possible. Allow the piston or ball to fall to the bottom of the column. 7. Remove the mouthpiece from  your mouth and breathe out normally. 8. Rest for a few seconds and repeat Steps 1 through 7 at least 10 times every 1-2 hours when you are awake. Take your time and take a few normal breaths between deep breaths. 9. The spirometer may include an indicator to show your best effort. Use the indicator as a goal to work toward during each repetition. 10. After each set of 10 deep breaths, practice coughing to be sure your lungs are clear. If you have an incision (the cut made at the time of surgery), support your incision when coughing by placing a pillow or rolled up towels firmly against it. Once you are able to get out of bed, walk around indoors and cough well. You may stop using the incentive spirometer when instructed by your caregiver.  RISKS AND COMPLICATIONS  Take your time so you do not get dizzy or light-headed.  If you are in pain, you may need to take or ask for pain medication before doing incentive spirometry. It is harder to take a deep breath if you are having pain. AFTER USE  Rest and breathe slowly and easily.  It can be helpful to keep track of a log of your progress. Your caregiver can provide you with a simple table to help with this. If you are using the spirometer at home, follow  these instructions: [place] IF:   You are having difficultly using the spirometer.  You have trouble using the spirometer as often as instructed.  Your pain medication is not giving enough relief while using the spirometer.  You develop fever of 100.5 F (38.1 C) or higher. SEEK IMMEDIATE MEDICAL CARE IF:   You cough up bloody sputum that had not been present before.  You develop fever of 102 F (38.9 C) or greater.  You develop worsening pain at or near the incision site. MAKE SURE YOU:   Understand these instructions.  Will watch your condition.  Will get help right away if you are not doing well or get worse. Document Released: 09/29/2006 Document Revised: 08/11/2011 Document Reviewed: 11/30/2006 ExitCare Patient Information 2014 ExitCare, [place].   ________________________________________________________________________  WHAT IS A BLOOD TRANSFUSION? Blood Transfusion Information  A transfusion is the replacement of blood or some of its parts. Blood is made up of multiple cells which provide different functions.  Red blood cells carry oxygen and are used for blood loss replacement.  White blood cells fight against infection.  Platelets control bleeding.  Plasma helps clot blood.  Other blood products are available for specialized needs, such as hemophilia or other clotting disorders. BEFORE THE TRANSFUSION  Who gives blood for transfusions?   Healthy volunteers who are fully evaluated to make sure their blood is safe. This is blood bank blood. Transfusion therapy is the safest it has ever been in the practice of medicine. Before blood is taken from a donor, a complete history is taken to make sure that person has no history of diseases nor engages in risky social behavior (examples are intravenous drug use or sexual activity with multiple partners). The donor's travel history is screened to minimize risk of transmitting infections, such as malaria. The  donated blood is tested for signs of infectious diseases, such as HIV and hepatitis. The blood is then tested to be sure it is compatible with you in order to minimize the chance of a transfusion reaction. If you or a relative donates blood, this is often done in anticipation of surgery and is  not appropriate for emergency situations. It takes many days to process the donated blood. RISKS AND COMPLICATIONS Although transfusion therapy is very safe and saves many lives, the main dangers of transfusion include:   Getting an infectious disease.  Developing a transfusion reaction. This is an allergic reaction to something in the blood you were given. Every precaution is taken to prevent this. The decision to have a blood transfusion has been considered carefully by your caregiver before blood is given. Blood is not given unless the benefits outweigh the risks. AFTER THE TRANSFUSION  Right after receiving a blood transfusion, you will usually feel much better and more energetic. This is especially true if your red blood cells have gotten low (anemic). The transfusion raises the level of the red blood cells which carry oxygen, and this usually causes an energy increase.  The nurse administering the transfusion will monitor you carefully for complications. HOME CARE INSTRUCTIONS  No special instructions are needed after a transfusion. You may find your energy is better. Speak with your caregiver about any limitations on activity for underlying diseases you may have. SEEK MEDICAL CARE IF:   Your condition is not improving after your transfusion.  You develop redness or irritation at the intravenous (IV) site. SEEK IMMEDIATE MEDICAL CARE IF:  Any of the following symptoms occur over the next 12 hours:  Shaking chills.  You have a temperature by mouth above 102 F (38.9 C), not controlled by medicine.  Chest, back, or muscle pain.  People around you feel you are not acting correctly or are  confused.  Shortness of breath or difficulty breathing.  Dizziness and fainting.  You get a rash or develop hives.  You have a decrease in urine output.  Your urine turns a dark color or changes to pink, red, or Ackman. Any of the following symptoms occur over the next 10 days:  You have a temperature by mouth above 102 F (38.9 C), not controlled by medicine.  Shortness of breath.  Weakness after normal activity.  The white part of the eye turns yellow (jaundice).  You have a decrease in the amount of urine or are urinating less often.  Your urine turns a dark color or changes to pink, red, or [place]. Document Released: 05/16/2000 Document Revised: 08/11/2011 Document Reviewed: 01/03/2008 [facility] Patient Information 2014 [place], [place].  _______________________________________________________________________

## 2017-11-16 NOTE — Progress Notes (Signed)
EKG 09-07-17 Epic

## 2017-11-17 ENCOUNTER — Ambulatory Visit: Payer: 59

## 2017-11-17 ENCOUNTER — Ambulatory Visit
Admission: RE | Admit: 2017-11-17 | Discharge: 2017-11-17 | Disposition: A | Discharge status: Home / Self Care | Payer: 59 | Source: Ambulatory Visit | Attending: Radiation Oncology | Admitting: Radiation Oncology

## 2017-11-17 DIAGNOSIS — Z170 Estrogen receptor positive status [ER+]: Secondary | ICD-10-CM | POA: Diagnosis not present

## 2017-11-17 DIAGNOSIS — C50412 Malignant neoplasm of upper-outer quadrant of left female breast: Secondary | ICD-10-CM | POA: Diagnosis not present

## 2017-11-17 DIAGNOSIS — Z510 Encounter for antineoplastic radiation therapy: Secondary | ICD-10-CM | POA: Diagnosis not present

## 2017-11-18 ENCOUNTER — Encounter: Payer: Self-pay | Admitting: Radiation Oncology

## 2017-11-18 ENCOUNTER — Encounter (HOSPITAL_COMMUNITY)
Admission: RE | Admit: 2017-11-18 | Discharge: 2017-11-18 | Disposition: A | Discharge status: Home / Self Care | Payer: 59 | Source: Ambulatory Visit | Attending: Gynecologic Oncology | Admitting: Gynecologic Oncology

## 2017-11-18 ENCOUNTER — Encounter (HOSPITAL_COMMUNITY): Payer: Self-pay

## 2017-11-18 ENCOUNTER — Other Ambulatory Visit: Payer: Self-pay

## 2017-11-18 ENCOUNTER — Ambulatory Visit
Admission: RE | Admit: 2017-11-18 | Discharge: 2017-11-18 | Disposition: A | Discharge status: Home / Self Care | Payer: 59 | Source: Ambulatory Visit | Attending: Radiation Oncology | Admitting: Radiation Oncology

## 2017-11-18 DIAGNOSIS — Z170 Estrogen receptor positive status [ER+]: Secondary | ICD-10-CM | POA: Diagnosis not present

## 2017-11-18 DIAGNOSIS — Z01812 Encounter for preprocedural laboratory examination: Secondary | ICD-10-CM | POA: Diagnosis not present

## 2017-11-18 DIAGNOSIS — C50412 Malignant neoplasm of upper-outer quadrant of left female breast: Secondary | ICD-10-CM | POA: Diagnosis not present

## 2017-11-18 DIAGNOSIS — Z510 Encounter for antineoplastic radiation therapy: Secondary | ICD-10-CM | POA: Diagnosis not present

## 2017-11-18 DIAGNOSIS — N83209 Unspecified ovarian cyst, unspecified side: Secondary | ICD-10-CM | POA: Diagnosis not present

## 2017-11-18 HISTORY — DX: Cough, unspecified: R05.9

## 2017-11-18 HISTORY — DX: Unspecified ovarian cyst, unspecified side: N83.209

## 2017-11-18 HISTORY — DX: Nasal congestion: R09.81

## 2017-11-18 HISTORY — DX: Cough: R05

## 2017-11-18 LAB — COMPREHENSIVE METABOLIC PANEL
ALT: 51 U/L (ref 14–54)
AST: 35 U/L (ref 15–41)
Albumin: 4.1 g/dL (ref 3.5–5.0)
Alkaline Phosphatase: 84 U/L (ref 38–126)
Anion gap: 6 (ref 5–15)
BUN: 20 mg/dL (ref 6–20)
CHLORIDE: 109 mmol/L (ref 101–111)
CO2: 27 mmol/L (ref 22–32)
Calcium: 9.1 mg/dL (ref 8.9–10.3)
Creatinine, Ser: 0.72 mg/dL (ref 0.44–1.00)
Glucose, Bld: 95 mg/dL (ref 65–99)
POTASSIUM: 4.2 mmol/L (ref 3.5–5.1)
SODIUM: 142 mmol/L (ref 135–145)
Total Bilirubin: 0.5 mg/dL (ref 0.3–1.2)
Total Protein: 6.9 g/dL (ref 6.5–8.1)

## 2017-11-18 LAB — URINALYSIS, ROUTINE W REFLEX MICROSCOPIC
BILIRUBIN URINE: NEGATIVE
Glucose, UA: NEGATIVE mg/dL
HGB URINE DIPSTICK: NEGATIVE
Ketones, ur: NEGATIVE mg/dL
Leukocytes, UA: NEGATIVE
NITRITE: NEGATIVE
PROTEIN: NEGATIVE mg/dL
Specific Gravity, Urine: 1.004 — ABNORMAL LOW (ref 1.005–1.030)
pH: 8 (ref 5.0–8.0)

## 2017-11-18 LAB — ABO/RH: ABO/RH(D): A POS

## 2017-11-18 LAB — CBC
HCT: 40.7 % (ref 36.0–46.0)
Hemoglobin: 13.7 g/dL (ref 12.0–15.0)
MCH: 30.2 pg (ref 26.0–34.0)
MCHC: 33.7 g/dL (ref 30.0–36.0)
MCV: 89.6 fL (ref 78.0–100.0)
PLATELETS: 217 10*3/uL (ref 150–400)
RBC: 4.54 MIL/uL (ref 3.87–5.11)
RDW: 12.6 % (ref 11.5–15.5)
WBC: 4.3 10*3/uL (ref 4.0–10.5)

## 2017-11-18 NOTE — Progress Notes (Signed)
ua done 11-18-16 routed to Dr, Denman George via epic

## 2017-11-19 ENCOUNTER — Encounter: Payer: Self-pay | Admitting: *Deleted

## 2017-11-20 ENCOUNTER — Telehealth: Payer: Self-pay | Admitting: *Deleted

## 2017-11-20 NOTE — Telephone Encounter (Signed)
Called and left the patient a message to call the office back. Need to move the appt from July 19th to July 23rd

## 2017-11-20 NOTE — Telephone Encounter (Signed)
Patient called back and rescheduled the appt from July 19th to July 23rd. Patient also stated "At my pre op appt I had a productive cough with some congestion. They told me if the color changed to let Dr.Rossi know. The color is yellowish with no fever. I am taking OTC medicine for cough/congestion." Melissa APP notified

## 2017-11-23 ENCOUNTER — Ambulatory Visit (HOSPITAL_COMMUNITY): Payer: 59 | Admitting: Anesthesiology

## 2017-11-23 ENCOUNTER — Encounter (HOSPITAL_COMMUNITY)
Admission: RE | Disposition: A | Discharge status: Home / Self Care | Payer: Self-pay | Source: Ambulatory Visit | Attending: Gynecologic Oncology

## 2017-11-23 ENCOUNTER — Encounter (HOSPITAL_COMMUNITY): Payer: Self-pay

## 2017-11-23 ENCOUNTER — Ambulatory Visit (HOSPITAL_COMMUNITY)
Admission: RE | Admit: 2017-11-23 | Discharge: 2017-11-23 | Disposition: A | Discharge status: Home / Self Care | Payer: 59 | Source: Ambulatory Visit | Attending: Gynecologic Oncology | Admitting: Gynecologic Oncology

## 2017-11-23 DIAGNOSIS — N83201 Unspecified ovarian cyst, right side: Secondary | ICD-10-CM | POA: Diagnosis not present

## 2017-11-23 DIAGNOSIS — E669 Obesity, unspecified: Secondary | ICD-10-CM | POA: Diagnosis not present

## 2017-11-23 DIAGNOSIS — C50412 Malignant neoplasm of upper-outer quadrant of left female breast: Secondary | ICD-10-CM

## 2017-11-23 DIAGNOSIS — Z923 Personal history of irradiation: Secondary | ICD-10-CM | POA: Diagnosis not present

## 2017-11-23 DIAGNOSIS — Z6834 Body mass index (BMI) 34.0-34.9, adult: Secondary | ICD-10-CM | POA: Diagnosis not present

## 2017-11-23 DIAGNOSIS — I10 Essential (primary) hypertension: Secondary | ICD-10-CM | POA: Diagnosis not present

## 2017-11-23 DIAGNOSIS — D270 Benign neoplasm of right ovary: Secondary | ICD-10-CM | POA: Diagnosis not present

## 2017-11-23 DIAGNOSIS — Z853 Personal history of malignant neoplasm of breast: Secondary | ICD-10-CM | POA: Diagnosis not present

## 2017-11-23 DIAGNOSIS — F419 Anxiety disorder, unspecified: Secondary | ICD-10-CM | POA: Insufficient documentation

## 2017-11-23 DIAGNOSIS — N83209 Unspecified ovarian cyst, unspecified side: Secondary | ICD-10-CM

## 2017-11-23 DIAGNOSIS — Z883 Allergy status to other anti-infective agents status: Secondary | ICD-10-CM | POA: Insufficient documentation

## 2017-11-23 DIAGNOSIS — D271 Benign neoplasm of left ovary: Secondary | ICD-10-CM | POA: Diagnosis not present

## 2017-11-23 DIAGNOSIS — Z170 Estrogen receptor positive status [ER+]: Secondary | ICD-10-CM

## 2017-11-23 DIAGNOSIS — Z888 Allergy status to other drugs, medicaments and biological substances status: Secondary | ICD-10-CM | POA: Insufficient documentation

## 2017-11-23 DIAGNOSIS — N801 Endometriosis of ovary: Secondary | ICD-10-CM | POA: Insufficient documentation

## 2017-11-23 DIAGNOSIS — Z87891 Personal history of nicotine dependence: Secondary | ICD-10-CM | POA: Diagnosis not present

## 2017-11-23 DIAGNOSIS — N888 Other specified noninflammatory disorders of cervix uteri: Secondary | ICD-10-CM | POA: Insufficient documentation

## 2017-11-23 DIAGNOSIS — E66811 Obesity, class 1: Secondary | ICD-10-CM

## 2017-11-23 DIAGNOSIS — E7800 Pure hypercholesterolemia, unspecified: Secondary | ICD-10-CM | POA: Diagnosis not present

## 2017-11-23 DIAGNOSIS — Z6833 Body mass index (BMI) 33.0-33.9, adult: Secondary | ICD-10-CM

## 2017-11-23 DIAGNOSIS — F329 Major depressive disorder, single episode, unspecified: Secondary | ICD-10-CM | POA: Insufficient documentation

## 2017-11-23 DIAGNOSIS — E7849 Other hyperlipidemia: Secondary | ICD-10-CM | POA: Diagnosis not present

## 2017-11-23 DIAGNOSIS — Z6832 Body mass index (BMI) 32.0-32.9, adult: Secondary | ICD-10-CM

## 2017-11-23 HISTORY — PX: ROBOTIC ASSISTED TOTAL HYSTERECTOMY WITH BILATERAL SALPINGO OOPHERECTOMY: SHX6086

## 2017-11-23 LAB — TYPE AND SCREEN
ABO/RH(D): A POS
ANTIBODY SCREEN: NEGATIVE

## 2017-11-23 SURGERY — HYSTERECTOMY, TOTAL, ROBOT-ASSISTED, LAPAROSCOPIC, WITH BILATERAL SALPINGO-OOPHORECTOMY
Anesthesia: General | Laterality: Bilateral

## 2017-11-23 MED ORDER — CEFAZOLIN SODIUM-DEXTROSE 2-4 GM/100ML-% IV SOLN
2.0000 g | INTRAVENOUS | Status: AC
  Administered 2017-11-23: 2 g via INTRAVENOUS
  Filled 2017-11-23: qty 100

## 2017-11-23 MED ORDER — PROPOFOL 10 MG/ML IV BOLUS
INTRAVENOUS | Status: DC | PRN
  Administered 2017-11-23: 150 mg via INTRAVENOUS
  Administered 2017-11-23: 50 mg via INTRAVENOUS

## 2017-11-23 MED ORDER — HYDROMORPHONE HCL 1 MG/ML IJ SOLN
INTRAMUSCULAR | Status: AC
  Administered 2017-11-23: 0.5 mg via INTRAVENOUS
  Filled 2017-11-23: qty 2

## 2017-11-23 MED ORDER — ROCURONIUM BROMIDE 100 MG/10ML IV SOLN
INTRAVENOUS | Status: DC | PRN
  Administered 2017-11-23: 50 mg via INTRAVENOUS

## 2017-11-23 MED ORDER — KETOROLAC TROMETHAMINE 30 MG/ML IJ SOLN
30.0000 mg | Freq: Once | INTRAMUSCULAR | Status: AC | PRN
  Administered 2017-11-23: 30 mg via INTRAVENOUS

## 2017-11-23 MED ORDER — STERILE WATER FOR INJECTION IJ SOLN
INTRAMUSCULAR | Status: AC
  Filled 2017-11-23: qty 10

## 2017-11-23 MED ORDER — MIDAZOLAM HCL 2 MG/2ML IJ SOLN
INTRAMUSCULAR | Status: AC
  Filled 2017-11-23: qty 2

## 2017-11-23 MED ORDER — KETAMINE HCL 10 MG/ML IJ SOLN
INTRAMUSCULAR | Status: AC
  Filled 2017-11-23: qty 1

## 2017-11-23 MED ORDER — CELECOXIB 200 MG PO CAPS
400.0000 mg | ORAL_CAPSULE | ORAL | Status: AC
  Administered 2017-11-23: 400 mg via ORAL
  Filled 2017-11-23: qty 2

## 2017-11-23 MED ORDER — KETAMINE HCL 50 MG/ML IJ SOLN
INTRAMUSCULAR | Status: DC | PRN
  Administered 2017-11-23: 24 mg via INTRAMUSCULAR

## 2017-11-23 MED ORDER — GLYCOPYRROLATE 0.2 MG/ML IV SOSY
PREFILLED_SYRINGE | INTRAVENOUS | Status: AC
  Filled 2017-11-23: qty 3

## 2017-11-23 MED ORDER — MIDAZOLAM HCL 5 MG/5ML IJ SOLN
INTRAMUSCULAR | Status: DC | PRN
  Administered 2017-11-23: 2 mg via INTRAVENOUS

## 2017-11-23 MED ORDER — OXYCODONE HCL 5 MG PO TABS: 5.0000 mg | ORAL_TABLET | Freq: Once | ORAL | Status: DC | PRN

## 2017-11-23 MED ORDER — HYDROMORPHONE HCL 1 MG/ML IJ SOLN: 0.2500 mg | INTRAMUSCULAR | Status: DC | PRN

## 2017-11-23 MED ORDER — KETOROLAC TROMETHAMINE 30 MG/ML IJ SOLN
INTRAMUSCULAR | Status: AC
  Filled 2017-11-23: qty 1

## 2017-11-23 MED ORDER — OXYCODONE HCL 5 MG/5ML PO SOLN: 5.0000 mg | Freq: Once | ORAL | Status: DC | PRN

## 2017-11-23 MED ORDER — ACETAMINOPHEN 160 MG/5ML PO SOLN: 325.0000 mg | ORAL | Status: DC | PRN

## 2017-11-23 MED ORDER — LIDOCAINE 2% (20 MG/ML) 5 ML SYRINGE
INTRAMUSCULAR | Status: AC
  Filled 2017-11-23: qty 10

## 2017-11-23 MED ORDER — LIDOCAINE 2% (20 MG/ML) 5 ML SYRINGE
INTRAMUSCULAR | Status: DC | PRN
  Administered 2017-11-23: 50 mg via INTRAVENOUS

## 2017-11-23 MED ORDER — ENOXAPARIN SODIUM 40 MG/0.4ML ~~LOC~~ SOLN
40.0000 mg | SUBCUTANEOUS | Status: AC
  Administered 2017-11-23: 40 mg via SUBCUTANEOUS
  Filled 2017-11-23: qty 0.4

## 2017-11-23 MED ORDER — GABAPENTIN 300 MG PO CAPS
300.0000 mg | ORAL_CAPSULE | ORAL | Status: AC
  Administered 2017-11-23: 300 mg via ORAL
  Filled 2017-11-23: qty 1

## 2017-11-23 MED ORDER — ONDANSETRON HCL 4 MG/2ML IJ SOLN: 4.0000 mg | Freq: Once | INTRAMUSCULAR | Status: DC | PRN

## 2017-11-23 MED ORDER — FENTANYL CITRATE (PF) 100 MCG/2ML IJ SOLN
INTRAMUSCULAR | Status: AC
  Filled 2017-11-23: qty 2

## 2017-11-23 MED ORDER — DEXAMETHASONE SODIUM PHOSPHATE 4 MG/ML IJ SOLN
4.0000 mg | INTRAMUSCULAR | Status: AC
  Administered 2017-11-23: 10 mg via INTRAVENOUS

## 2017-11-23 MED ORDER — SUGAMMADEX SODIUM 200 MG/2ML IV SOLN
INTRAVENOUS | Status: AC
  Filled 2017-11-23: qty 2

## 2017-11-23 MED ORDER — BUPIVACAINE HCL (PF) 0.25 % IJ SOLN
INTRAMUSCULAR | Status: AC
  Filled 2017-11-23: qty 30

## 2017-11-23 MED ORDER — LIDOCAINE IN D5W 4-5 MG/ML-% IV SOLN
INTRAVENOUS | Status: DC | PRN
  Administered 2017-11-23: 1.5 ug/kg/min via INTRAVENOUS

## 2017-11-23 MED ORDER — GLYCOPYRROLATE PF 0.2 MG/ML IJ SOSY
PREFILLED_SYRINGE | INTRAMUSCULAR | Status: DC | PRN
  Administered 2017-11-23: .2 mg via INTRAVENOUS

## 2017-11-23 MED ORDER — ACETAMINOPHEN 325 MG PO TABS: 325.0000 mg | ORAL_TABLET | ORAL | Status: DC | PRN

## 2017-11-23 MED ORDER — BUPIVACAINE HCL (PF) 0.25 % IJ SOLN
INTRAMUSCULAR | Status: DC | PRN
  Administered 2017-11-23: 15 mL

## 2017-11-23 MED ORDER — HYDROMORPHONE HCL 1 MG/ML IJ SOLN
0.2500 mg | INTRAMUSCULAR | Status: DC | PRN
  Administered 2017-11-23: 0.5 mg via INTRAVENOUS
  Administered 2017-11-23: 0.25 mg via INTRAVENOUS
  Administered 2017-11-23: 0.5 mg via INTRAVENOUS

## 2017-11-23 MED ORDER — SCOPOLAMINE 1 MG/3DAYS TD PT72
1.0000 | MEDICATED_PATCH | TRANSDERMAL | Status: DC
  Administered 2017-11-23: 1.5 mg via TRANSDERMAL
  Filled 2017-11-23: qty 1

## 2017-11-23 MED ORDER — SUGAMMADEX SODIUM 200 MG/2ML IV SOLN
INTRAVENOUS | Status: DC | PRN
  Administered 2017-11-23: 200 mg via INTRAVENOUS

## 2017-11-23 MED ORDER — HYDROCODONE-ACETAMINOPHEN 7.5-325 MG PO TABS: 1.0000 | ORAL_TABLET | Freq: Once | ORAL | Status: DC | PRN

## 2017-11-23 MED ORDER — ROCURONIUM BROMIDE 100 MG/10ML IV SOLN
INTRAVENOUS | Status: AC
  Filled 2017-11-23: qty 1

## 2017-11-23 MED ORDER — FENTANYL CITRATE (PF) 100 MCG/2ML IJ SOLN
INTRAMUSCULAR | Status: DC | PRN
  Administered 2017-11-23: 100 ug via INTRAVENOUS

## 2017-11-23 MED ORDER — ONDANSETRON HCL 4 MG/2ML IJ SOLN
INTRAMUSCULAR | Status: AC
  Filled 2017-11-23: qty 2

## 2017-11-23 MED ORDER — PROMETHAZINE HCL 25 MG/ML IJ SOLN: 6.2500 mg | INTRAMUSCULAR | Status: DC | PRN

## 2017-11-23 MED ORDER — DEXAMETHASONE SODIUM PHOSPHATE 10 MG/ML IJ SOLN
INTRAMUSCULAR | Status: AC
  Filled 2017-11-23: qty 1

## 2017-11-23 MED ORDER — LACTATED RINGERS IV SOLN
INTRAVENOUS | Status: DC
  Administered 2017-11-23: 07:00:00 via INTRAVENOUS

## 2017-11-23 MED ORDER — ONDANSETRON HCL 4 MG/2ML IJ SOLN
INTRAMUSCULAR | Status: DC | PRN
  Administered 2017-11-23: 4 mg via INTRAVENOUS

## 2017-11-23 MED ORDER — ACETAMINOPHEN 500 MG PO TABS
1000.0000 mg | ORAL_TABLET | ORAL | Status: AC
  Administered 2017-11-23: 1000 mg via ORAL
  Filled 2017-11-23: qty 2

## 2017-11-23 MED ORDER — PROPOFOL 10 MG/ML IV BOLUS
INTRAVENOUS | Status: AC
  Filled 2017-11-23: qty 20

## 2017-11-23 MED ORDER — PROPOFOL 10 MG/ML IV BOLUS
INTRAVENOUS | Status: AC
  Filled 2017-11-23: qty 40

## 2017-11-23 MED ORDER — OXYCODONE-ACETAMINOPHEN 5-325 MG PO TABS: 1.0000 | ORAL_TABLET | ORAL | 0 refills | Status: DC | PRN

## 2017-11-23 MED FILL — Lidocaine IV Infusion in D5W Inj 4 MG/ML: INTRAVENOUS | Qty: 500 | Status: AC

## 2017-11-23 MED FILL — OXYCODONE-ACETAMINOPHEN 5-3: 5-325 | 3 days supply | Qty: 20 | Fill #0

## 2017-11-23 SURGICAL SUPPLY — 50 items
ADH SKN CLS APL DERMABOND .7 (GAUZE/BANDAGES/DRESSINGS) ×4
AGENT HMST KT MTR STRL THRMB (HEMOSTASIS)
APL ESCP 34 STRL LF DISP (HEMOSTASIS)
APPLICATOR SURGIFLO ENDO (HEMOSTASIS) IMPLANT
BAG LAPAROSCOPIC 12 15 PORT 16 (BASKET) IMPLANT
BAG RETRIEVAL 12/15 (BASKET)
BAG SPEC RTRVL LRG 6X4 10 (ENDOMECHANICALS)
CONT SPECI 4OZ STER CLIK (MISCELLANEOUS) ×2 IMPLANT
COVER BACK TABLE 60X90IN (DRAPES) ×2 IMPLANT
COVER TIP SHEARS 8 DVNC (MISCELLANEOUS) ×1 IMPLANT
COVER TIP SHEARS 8MM DA VINCI (MISCELLANEOUS) ×1
DERMABOND ADVANCED (GAUZE/BANDAGES/DRESSINGS) ×4
DERMABOND ADVANCED .7 DNX12 (GAUZE/BANDAGES/DRESSINGS) ×1 IMPLANT
DRAPE ARM DVNC X/XI (DISPOSABLE) ×4 IMPLANT
DRAPE COLUMN DVNC XI (DISPOSABLE) ×1 IMPLANT
DRAPE DA VINCI XI ARM (DISPOSABLE) ×4
DRAPE DA VINCI XI COLUMN (DISPOSABLE) ×1
DRAPE SHEET LG 3/4 BI-LAMINATE (DRAPES) ×2 IMPLANT
DRAPE SURG IRRIG POUCH 19X23 (DRAPES) ×2 IMPLANT
ELECT REM PT RETURN 15FT ADLT (MISCELLANEOUS) ×2 IMPLANT
GLOVE BIO SURGEON STRL SZ 6 (GLOVE) ×8 IMPLANT
GLOVE BIO SURGEON STRL SZ 6.5 (GLOVE) IMPLANT
GOWN STRL REUS W/ TWL LRG LVL3 (GOWN DISPOSABLE) ×2 IMPLANT
GOWN STRL REUS W/TWL LRG LVL3 (GOWN DISPOSABLE) ×4
HOLDER FOLEY CATH W/STRAP (MISCELLANEOUS) ×2 IMPLANT
IRRIG SUCT STRYKERFLOW 2 WTIP (MISCELLANEOUS) ×2
IRRIGATION SUCT STRKRFLW 2 WTP (MISCELLANEOUS) ×1 IMPLANT
KIT PROCEDURE DA VINCI SI (MISCELLANEOUS)
KIT PROCEDURE DVNC SI (MISCELLANEOUS) IMPLANT
MANIPULATOR UTERINE 4.5 ZUMI (MISCELLANEOUS) ×2 IMPLANT
NDL SPNL 18GX3.5 QUINCKE PK (NEEDLE) IMPLANT
NEEDLE SPNL 18GX3.5 QUINCKE PK (NEEDLE) IMPLANT
OBTURATOR OPTICAL STANDARD 8MM (TROCAR) ×1
OBTURATOR OPTICAL STND 8 DVNC (TROCAR) ×1
OBTURATOR OPTICALSTD 8 DVNC (TROCAR) ×1 IMPLANT
PACK ROBOT GYN CUSTOM WL (TRAY / TRAY PROCEDURE) ×2 IMPLANT
PAD POSITIONING PINK XL (MISCELLANEOUS) ×2 IMPLANT
POUCH SPECIMEN RETRIEVAL 10MM (ENDOMECHANICALS) IMPLANT
SEAL CANN UNIV 5-8 DVNC XI (MISCELLANEOUS) ×4 IMPLANT
SEAL XI 5MM-8MM UNIVERSAL (MISCELLANEOUS) ×4
SET TRI-LUMEN FLTR TB AIRSEAL (TUBING) ×2 IMPLANT
SURGIFLO W/THROMBIN 8M KIT (HEMOSTASIS) IMPLANT
SUT VIC AB 0 CT1 27 (SUTURE) ×2
SUT VIC AB 0 CT1 27XBRD ANTBC (SUTURE) IMPLANT
SYR 10ML LL (SYRINGE) IMPLANT
TOWEL OR NON WOVEN STRL DISP B (DISPOSABLE) ×2 IMPLANT
TRAP SPECIMEN MUCOUS 40CC (MISCELLANEOUS) ×1 IMPLANT
TRAY FOLEY MTR SLVR 16FR STAT (SET/KITS/TRAYS/PACK) ×2 IMPLANT
UNDERPAD 30X30 (UNDERPADS AND DIAPERS) ×2 IMPLANT
WATER STERILE IRR 1000ML POUR (IV SOLUTION) ×2 IMPLANT

## 2017-11-23 NOTE — Anesthesia Procedure Notes (Signed)
Procedure Name: Intubation Date/Time: 11/23/2017 8:20 AM Performed by: Eulas Post, Lark Langenfeld W, CRNA Pre-anesthesia Checklist: Patient identified, Emergency Drugs available, Suction available and Patient being monitored Patient Re-evaluated:Patient Re-evaluated prior to induction Oxygen Delivery Method: Circle system utilized Preoxygenation: Pre-oxygenation with 100% oxygen Induction Type: IV induction Ventilation: Mask ventilation without difficulty Laryngoscope Size: Miller and 2 Grade View: Grade II Tube type: Oral Tube size: 7.0 mm Number of attempts: 1 Airway Equipment and Method: Stylet and Oral airway Placement Confirmation: ETT inserted through vocal cords under direct vision,  positive ETCO2 and breath sounds checked- equal and bilateral Secured at: 23 cm Tube secured with: Tape Dental Injury: Teeth and Oropharynx as per pre-operative assessment

## 2017-11-23 NOTE — Interval H&P Note (Signed)
History and Physical Interval Note:  11/23/2017 6:59 AM  Connie Clarke  has presented today for surgery, with the diagnosis of ovarian cyst  The various methods of treatment have been discussed with the patient and family. After consideration of risks, benefits and other options for treatment, the patient has consented to  Procedure(s): XI ROBOTIC ASSISTED TOTAL HYSTERECTOMY WITH BILATERAL SALPINGO OOPHORECTOMY, POSSIBLE STAGING (Bilateral) as a surgical intervention .  The patient's history has been reviewed, patient examined, no change in status, stable for surgery.  I have reviewed the patient's chart and labs.  Questions were answered to the patient's satisfaction.     Thereasa Solo

## 2017-11-23 NOTE — Discharge Instructions (Signed)
11/23/2017  Return to work: 4 weeks   Activity: 1. Be up and out of the bed during the day.  Take a nap if needed.  You may walk up steps but be careful and use the hand rail.  Stair climbing will tire you more than you think, you may need to stop part way and rest.   2. No lifting or straining for 4 weeks.  3. No driving for 1 weeks.  Do Not drive if you are taking narcotic pain medicine.  4. Shower daily.  Use soap and water on your incision and pat dry; don't rub.       General Anesthesia, Adult, Care After These instructions provide you with information about caring for yourself after your procedure. Your health care provider may also give you more specific instructions. Your treatment has been planned according to current medical practices, but problems sometimes occur. Call your health care provider if you have any problems or questions after your procedure. What can I expect after the procedure? After the procedure, it is common to have:  Vomiting.  A sore throat.  Mental slowness.  It is common to feel:  Nauseous.  Cold or shivery.  Sleepy.  Tired.  Sore or achy, even in parts of your body where you did not have surgery.  Follow these instructions at home: For at least 24 hours after the procedure:  Do not: ? Participate in activities where you could fall or become injured. ? Drive. ? Use heavy machinery. ? Drink alcohol. ? Take sleeping pills or medicines that cause drowsiness. ? Make important decisions or sign legal documents. ? Take care of children on your own.  Rest. Eating and drinking  If you vomit, drink water, juice, or soup when you can drink without vomiting.  Drink enough fluid to keep your urine clear or pale yellow.  Make sure you have little or no nausea before eating solid foods.  Follow the diet recommended by your health care provider. General instructions  Have a responsible adult stay with you until you are awake and  alert.  Return to your normal activities as told by your health care provider. Ask your health care provider what activities are safe for you.  Take over-the-counter and prescription medicines only as told by your health care provider.  If you smoke, do not smoke without supervision.  Keep all follow-up visits as told by your health care provider. This is important. Contact a health care provider if:  You continue to have nausea or vomiting at home, and medicines are not helpful.  You cannot drink fluids or start eating again.  You cannot urinate after 8-12 hours.  You develop a skin rash.  You have fever.  You have increasing redness at the site of your procedure. Get help right away if:  You have difficulty breathing.  You have chest pain.  You have unexpected bleeding.  You feel that you are having a life-threatening or urgent problem. This information is not intended to replace advice given to you by your health care provider. Make sure you discuss any questions you have with your health care provider. Document Released: 08/25/2000 Document Revised: 10/22/2015 Document Reviewed: 05/03/2015 Elsevier Interactive Patient Education  2018 [employer].   5. No sexual activity and nothing in the vagina for 8 weeks.  Medications:  - Take ibuprofen and tylenol first line for pain control. Take these regularly (every 6 hours) to decrease the build up of pain.  - If  necessary, for severe pain not relieved by ibuprofen, take percocet.  - While taking percocet you should take sennakot every night to reduce the likelihood of constipation. If this causes diarrhea, stop its use.  Diet: 1. Low sodium Heart Healthy Diet is recommended.  2. It is safe to use a laxative if you have difficulty moving your bowels.   Wound Care: 1. Keep clean and dry.  Shower daily.  Reasons to call the Doctor:   Fever - Oral temperature greater than 100.4 degrees Fahrenheit  Foul-smelling  vaginal discharge  Difficulty urinating  Nausea and vomiting  Increased pain at the site of the incision that is unrelieved with pain medicine.  Difficulty breathing with or without chest pain  New calf pain especially if only on one side  Sudden, continuing increased vaginal bleeding with or without clots.   Follow-up: 1. See Everitt Amber in 3 weeks (approx).  Contacts: For questions or concerns you should contact:  Dr. Everitt Amber at [phone] After hours and on week-ends call [phone] and ask to speak to the physician on call for Gynecologic Oncology

## 2017-11-23 NOTE — Op Note (Signed)
OPERATIVE NOTE 11/23/17  Surgeon: Donaciano Eva   Assistants: Dr Lahoma Crocker (an MD assistant was necessary for tissue manipulation, management of robotic instrumentation, retraction and positioning due to the complexity of the case and hospital policies).   Anesthesia: General endotracheal anesthesia  ASA Class: 3   Pre-operative Diagnosis: adnexal mass  Post-operative Diagnosis: right ovarian cyst, history of hormone receptor positive breast cancer  Operation: Robotic-assisted laparoscopic total hysterectomy with bilateral salpingoophorectomy   Surgeon: Donaciano Eva  Assistant Surgeon: Precious Haws MD  Anesthesia: GET  Urine Output: 300cc  Operative Findings:  : 6cm normal appearing uterus, normal left tube and ovary, right ovary with 8cm multicystic mass, smooth surface, no excrescences.  Normal omentum and upper abdomen.  Estimated Blood Loss:  <25cc      Total IV Fluids: 400 ml         Specimens: washings, uterus, cervix , bilateral tubes and ovaries.         Complications:  None; patient tolerated the procedure well.         Disposition: PACU - hemodynamically stable.  Procedure Details  The patient was seen in the Holding Room. The risks, benefits, complications, treatment options, and expected outcomes were discussed with the patient.  The patient concurred with the proposed plan, giving informed consent.  The site of surgery properly noted/marked. The patient was identified as Connie Clarke and the procedure verified as a Robotic-assisted hysterectomy with bilateral salpingo oophorectomy. A Time Out was held and the above information confirmed.  After induction of anesthesia, the patient was draped and prepped in the usual sterile manner. Pt was placed in supine position after anesthesia and draped and prepped in the usual sterile manner. The abdominal drape was placed after the CholoraPrep had been allowed to dry for 3 minutes.  Her arms were  tucked to her side with all appropriate precautions.  The shoulders were stabilized with padded shoulder blocks applied to the acromium processes.  The patient was placed in the semi-lithotomy position in [place].  The perineum was prepped with Betadine. The patient was then prepped. Foley catheter was placed.  A sterile speculum was placed in the vagina.  The cervix was grasped with a single-tooth tenaculum and dilated with Kennon Rounds dilators.  The ZUMI uterine manipulator with a medium colpotomizer ring was placed without difficulty.  A pneum occluder balloon was placed over the manipulator.  OG tube placement was confirmed and to suction.   Next, a 12 mm skin incision was made 1 cm below the subcostal margin in the midclavicular line.  The 5 mm Optiview port and scope was used for direct entry.  Opening pressure was under 10 mm CO2.  The abdomen was insufflated and the findings were noted as above.   At this point and all points during the procedure, the patient's intra-abdominal pressure did not exceed 15 mmHg. Next, an 8 mm skin incision was made in the umbilicus and a right and left port was placed about 10 cm lateral to the robot port on the right and left side.   All ports were placed under direct visualization.  The patient was placed in steep Trendelenburg.  Bowel was folded away into the upper abdomen.  The robot was docked in the normal manner. Pelvic washings were obtained.   The hysterectomy was started after the round ligament on the right side was incised and the retroperitoneum was entered and the pararectal space was developed.  The ureter was noted to be  on the medial leaf of the broad ligament.  The peritoneum above the ureter was incised and stretched and the infundibulopelvic ligament was skeletonized, cauterized and cut.  The posterior peritoneum was taken down to the level of the KOH ring.  The anterior peritoneum was also taken down.  The bladder flap was created to the level of the  KOH ring.  The uterine artery on the right side was skeletonized, cauterized and cut in the normal manner.  A similar procedure was performed on the left.  The colpotomy was made and the uterus, cervix, bilateral ovaries and tubes were amputated and delivered through the vagina.  Pedicles were inspected and excellent hemostasis was achieved.    The colpotomy at the vaginal cuff was closed with Vicryl on a CT1 needle in a running manner.  Irrigation was used and excellent hemostasis was achieved.  At this point in the procedure was completed.  Robotic instruments were removed under direct visulaization.  The robot was undocked. The 10 mm ports were closed with Vicryl on a UR-5 needle and the fascia was closed with 0 Vicryl on a UR-5 needle.  The skin was closed with 4-0 Vicryl in a subcuticular manner.  Dermabond was applied.  Sponge, lap and needle counts correct x 2.  The patient was taken to the recovery room in stable condition.  The vagina was swabbed with  minimal bleeding noted.   All instrument and needle counts were correct x  3.   The patient was transferred to the recovery room in a stable condition.  Donaciano Eva, MD

## 2017-11-23 NOTE — Anesthesia Preprocedure Evaluation (Addendum)
Anesthesia Evaluation  Patient identified by MRN, date of birth, ID band Patient awake    Reviewed: Allergy & Precautions, NPO status , Patient's Chart, lab work & pertinent test results  Airway Mallampati: III  TM Distance: >3 FB Neck ROM: Full    Dental no notable dental hx.    Pulmonary former smoker,    Pulmonary exam normal breath sounds clear to auscultation       Cardiovascular hypertension, Pt. on medications Normal cardiovascular exam Rhythm:Regular Rate:Normal  ECG: NSR, rate 70   Neuro/Psych PSYCHIATRIC DISORDERS Anxiety Depression negative neurological ROS     GI/Hepatic negative GI ROS, Neg liver ROS,   Endo/Other  negative endocrine ROS  Renal/GU negative Renal ROS     Musculoskeletal negative musculoskeletal ROS (+)   Abdominal (+) + obese,   Peds  Hematology negative hematology ROS (+)   Anesthesia Other Findings ovarian cyst  Reproductive/Obstetrics                            Anesthesia Physical Anesthesia Plan  ASA: III  Anesthesia Plan: General   Post-op Pain Management:    Induction: Intravenous  PONV Risk Score and Plan: 4 or greater and Midazolam, Dexamethasone, Ondansetron, Scopolamine patch - Pre-op and Treatment may vary due to age or medical condition  Airway Management Planned: Oral ETT  Additional Equipment:   Intra-op Plan:   Post-operative Plan: Extubation in OR  Informed Consent: I have reviewed the patients History and Physical, chart, labs and discussed the procedure including the risks, benefits and alternatives for the proposed anesthesia with the patient or authorized representative who has indicated his/her understanding and acceptance.   Dental advisory given  Plan Discussed with: CRNA  Anesthesia Plan Comments:         Anesthesia Quick Evaluation

## 2017-11-23 NOTE — Progress Notes (Signed)
  Radiation Oncology         [phone] ________________________________  Name: Connie Clarke MRN: [id number]  Date: 11/18/2017  DOB: 1955-12-19  End of Treatment Note  Diagnosis:   62 y.o. female with Stage IA (pT1b,pN0,pM0) invasive ductal carcinoma of the left breast, ER+, PR+, Her2 -, Grade 2    Indication for treatment:  Curative, breast conservation therapy       Radiation treatment dates:   10/20/2017 - 11/18/2017  Site/dose:    1. Left Breast / 40.05 Gy in 15 fractions 2. Left Breast Boost / 10 Gy in 5 fractions  Beams/energy:    1. 3D / 10X, 6X Photon 2. 3D / 10X, 6X Photon boost  Narrative: The patient tolerated radiation treatment relatively well.  She experienced mild fatigue and some tenderness in her left breast. She had mild erythema and hyperpigmentation changes over her left breast but no skin breakdown. She is using Radiaplex 2 times per day.  Plan: The patient has completed radiation treatment. The patient will return to radiation oncology clinic for routine followup in one month. I advised them to call or return sooner if they have any questions or concerns related to their recovery or treatment.  -----------------------------------  Blair Promise, PhD, MD  This document serves as a record of services personally performed by Gery Pray, MD. It was created on his behalf by Rae Lips, a trained medical scribe. The creation of this record is based on the scribe's personal observations and the provider's statements to them. This document has been checked and approved by the attending provider.

## 2017-11-23 NOTE — Transfer of Care (Signed)
Immediate Anesthesia Transfer of Care Note  Patient: Connie Clarke  Procedure(s) Performed: XI ROBOTIC ASSISTED TOTAL HYSTERECTOMY WITH BILATERAL SALPINGO OOPHORECTOMY, (Bilateral )  Patient Location: PACU  Anesthesia Type:General  Level of Consciousness: sedated  Airway & Oxygen Therapy: Patient Spontanous Breathing and Patient connected to face mask oxygen  Post-op Assessment: Report given to RN and Post -op Vital signs reviewed and stable  Post vital signs: Reviewed and stable  Last Vitals:  Vitals Value Taken Time  BP 157/86 11/23/2017  9:54 AM  Temp    Pulse 70 11/23/2017  9:56 AM  Resp 17 11/23/2017  9:56 AM  SpO2 99 % 11/23/2017  9:56 AM  Vitals shown include unvalidated device data.  Last Pain:  Vitals:   11/23/17 0636  PainSc: 0-No pain         Complications: No apparent anesthesia complications

## 2017-11-23 NOTE — Anesthesia Postprocedure Evaluation (Signed)
Anesthesia Post Note  Patient: Connie Clarke  Procedure(s) Performed: XI ROBOTIC ASSISTED TOTAL HYSTERECTOMY WITH BILATERAL SALPINGO OOPHORECTOMY, (Bilateral )     Patient location during evaluation: PACU Anesthesia Type: General Level of consciousness: awake and alert Pain management: pain level controlled Vital Signs Assessment: post-procedure vital signs reviewed and stable Respiratory status: spontaneous breathing, nonlabored ventilation, respiratory function stable and patient connected to nasal cannula oxygen Cardiovascular status: blood pressure returned to baseline and stable Postop Assessment: no apparent nausea or vomiting Anesthetic complications: no    Last Vitals:  Vitals:   11/23/17 1143 11/23/17 1300  BP: (!) 147/91 (!) 144/72  Pulse: 79 66  Resp: 16   Temp:  36.5 C  SpO2: 94% 96%    Last Pain:  Vitals:   11/23/17 1143  PainSc: 2                  Ryan P Ellender

## 2017-11-24 ENCOUNTER — Encounter (HOSPITAL_COMMUNITY): Payer: Self-pay | Admitting: Gynecologic Oncology

## 2017-12-14 DIAGNOSIS — Z809 Family history of malignant neoplasm, unspecified: Secondary | ICD-10-CM | POA: Diagnosis not present

## 2017-12-18 ENCOUNTER — Ambulatory Visit: Payer: 59 | Admitting: Gynecologic Oncology

## 2017-12-22 ENCOUNTER — Encounter: Payer: Self-pay | Admitting: Gynecologic Oncology

## 2017-12-22 ENCOUNTER — Inpatient Hospital Stay: Payer: 59 | Attending: Hematology and Oncology | Admitting: Gynecologic Oncology

## 2017-12-22 VITALS — BP 140/80 | HR 83 | Temp 98.7°F | Resp 20 | Ht 64.0 in | Wt 194.7 lb

## 2017-12-22 DIAGNOSIS — Z90722 Acquired absence of ovaries, bilateral: Secondary | ICD-10-CM | POA: Insufficient documentation

## 2017-12-22 DIAGNOSIS — F17210 Nicotine dependence, cigarettes, uncomplicated: Secondary | ICD-10-CM | POA: Diagnosis not present

## 2017-12-22 DIAGNOSIS — D270 Benign neoplasm of right ovary: Secondary | ICD-10-CM | POA: Diagnosis not present

## 2017-12-22 DIAGNOSIS — Z90710 Acquired absence of both cervix and uterus: Secondary | ICD-10-CM | POA: Insufficient documentation

## 2017-12-22 DIAGNOSIS — N838 Other noninflammatory disorders of ovary, fallopian tube and broad ligament: Secondary | ICD-10-CM

## 2017-12-22 NOTE — Progress Notes (Signed)
Follow-up Note: Gyn-Onc  Consult was requested by Dr. Helane Rima for the evaluation of Connie Clarke 62 y.o. female  CC:  Chief Complaint  Patient presents with  . Ovarian mass    Assessment/Plan:  [name]  is a 62 y.o.  year old with an 8cm right ovarian cystadenofibroma, status post robotic assisted total hysterectomy BSO on November 23, 2017.  Connie Clarke has healed well from surgery and has no specific concerns.  I recommend follow-up on a as needed basis.   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 Runell Gess for right ovarian cystic mass in the setting of her recent breast cancer diagnosis.  The patient was diagnosed with an ER PR positive breast cancer in March 2019.  This was treated with surgical excision with Dr. Donne Hazel followed by chest wall radiation with Dr. Sondra Come.  The tumor was  a stage Ib tumor.  She is currently undergoing chest wall radiation for this.  She was seen and evaluated by Dr. Helane Rima for routine gynecologic care on Oct 27, 2017.  At that time a pelvic cystic mass was appreciated on examination.  This prompted her OB/GYN to perform an in office transvaginal ultrasound scan which was performed on 10/27/2017.  This revealed a uterus measuring 6.5 x 3.3 x 4.1 cm with an endometrial thickness of 5.4 mm.  The right ovary measured 7.5 x 5 x 5.3 cm and contain multiple cystic areas with question of blood flow in a small attached nodule.  The left ovary was then normal limits.  No free fluid was seen.  She then underwent a CT scan of the abdomen and pelvis on 10/30/2017, and this confirmed a 7.5 cm complex cystic lesion in the right adnexa which is indeterminate but probably benign characteristics.  No omental disease, ascites, lymphadenopathy, or carcinomatosis was identified.  Ca1 25 was drawn on 10/28/2017 and this was normal at 5.  The patient has a past medical history of taking bioidentical  hormones including estradiol patch and progesterone between 2015 and 2018.  She also use compounded testosterone occasionally during that time.  She has a history of an aunt with breast cancer diagnosis in her 72s and also died of pancreatic cancer.  She has a brother who developed metastatic prostate cancer in his 63s and another brother who developed prostate cancer in his 55s.  She has an allergy to a surgical preparation that was used during her breast cancer surgery.  Interval Hx:  On 11/23/17 she underwent a robotic assisted total laparoscopic hysterectomy with BSO.  Final pathology confirmed a benign 8 cm right ovarian cyst adenofibroma.  Intra-abdominal findings were unremarkable.  The surgery was uncomplicated.  Current Meds:  Outpatient Encounter Medications as of 12/22/2017  Medication Sig  . acetaminophen (TYLENOL) 325 MG tablet Take 650 mg by mouth every 6 (six) hours as needed (FOR PAIN.).  [name] ALPRAZolam (XANAX) 0.25 MG tablet Take 1 tablet (0.25 mg total) by mouth at bedtime as needed for anxiety.  . Cholecalciferol ([employer] VIT D 5000 HIGH-POTENCY) 5000 units capsule Take 1 capsule (5,000 Units total) by mouth daily.  [name] ibuprofen (ADVIL,MOTRIN) 200 MG tablet Take 200 mg by mouth every 8 (eight) hours as needed (FOR PAIN.).  [name] letrozole (FEMARA) 2.5 MG tablet Take 1 tablet (2.5 mg total) by mouth daily.  [name] lisinopril (PRINIVIL,ZESTRIL) 10 MG tablet Take 10 mg by mouth daily.   . Multiple Vitamins-Minerals (MULTIVITAMIN  WITH MINERALS) tablet Take 1 tablet by mouth daily.  . Turmeric Curcumin 500 MG CAPS Take 500 mg by mouth daily.  . [DISCONTINUED] oxyCODONE-acetaminophen (PERCOCET/ROXICET) 5-325 MG tablet Take 1 tablet by mouth every 4 (four) hours as needed for severe pain. (Patient not taking: Reported on 12/22/2017)   No facility-administered encounter medications on file as of 12/22/2017.     Allergy:  Allergies  Allergen Reactions  . Chlorhexidine Hives    Surgical prep scrub   . Steri-Strip Compound Benzoin [Benzoin Compound] Hives    Social Hx:   Social History   Socioeconomic History  . Marital status: Married    Spouse name: Not on file  . Number of children: Not on file  . Years of education: Not on file  . Highest education level: Not on file  Occupational History  . Occupation: Physical Diplomatic Services operational officer: [employer]  Social Needs  . Financial resource strain: Not on file  . Food insecurity:    Worry: Not on file    Inability: Not on file  . Transportation needs:    Medical: Not on file    Non-medical: Not on file  Tobacco Use  . Smoking status: Former Smoker    Years: 3.00    Types: Cigarettes    Last attempt to quit: 1980    Years since quitting: 39.5  . Smokeless tobacco: Never Used  Substance and Sexual Activity  . Alcohol use: Yes    Comment: OCC  . Drug use: Never  . Sexual activity: Not on file  Lifestyle  . Physical activity:    Days per week: Not on file    Minutes per session: Not on file  . Stress: Not on file  Relationships  . Social connections:    Talks on phone: Not on file    Gets together: Not on file    Attends religious service: Not on file    Active member of club or organization: Not on file    Attends meetings of clubs or organizations: Not on file    Relationship status: Not on file  . Intimate partner violence:    Fear of current or ex partner: Not on file    Emotionally abused: Not on file    Physically abused: Not on file    Forced sexual activity: Not on file  Other Topics Concern  . Not on file  Social History Narrative  . Not on file    Past Surgical Hx:  Past Surgical History:  Procedure Laterality Date  . BREAST LUMPECTOMY WITH RADIOACTIVE SEED AND SENTINEL LYMPH NODE BIOPSY Left 09/11/2017   Procedure: LEFT BREAST LUMPECTOMY WITH RADIOACTIVE SEED AND SENTINEL LYMPH NODE BIOPSY;  Surgeon: [name], MD;  Location: [facility];  Service: General;  Laterality:  Left;  . DILATION AND CURETTAGE OF UTERUS  1990  . ROBOTIC ASSISTED TOTAL HYSTERECTOMY WITH BILATERAL SALPINGO OOPHERECTOMY Bilateral 11/23/2017   Procedure: XI ROBOTIC ASSISTED TOTAL HYSTERECTOMY WITH BILATERAL SALPINGO OOPHORECTOMY,;  Surgeon: Everitt Amber, MD;  Location: [facility];  Service: Gynecology;  Laterality: Bilateral;    Past Medical Hx:  Past Medical History:  Diagnosis Date  . Anxiety   . Breast cancer, left ([place])   . Coughing    WHITEISH SPUTUM ONSET 11-17-17 PER PATIENT   . Hyperlipidemia   . Hypertension   . Infertility, female   . Joint pain   . Nasal congestion   . Ovarian cyst   . Palpitations  REPORTS NO LONGER OCCURS   . Vitamin D deficiency     Past Gynecological History:  SVDx 2 Patient's last menstrual period was 06/02/2005 (approximate).  Family Hx:  Family History  Problem Relation Age of Onset  . Stroke Mother   . Anxiety disorder Mother   . Obesity Mother   . Diabetes Father   . Hyperlipidemia Father   . Stroke Father   . Depression Father   . Obesity Father   . Melanoma Sister   . Prostate cancer Brother   . Breast cancer Neg Hx     Review of Systems:  Constitutional  Feels well,    ENT Normal appearing ears and nares bilaterally Skin/Breast  No rash, sores, jaundice, itching, dryness Cardiovascular  No chest pain, shortness of breath, or edema  Pulmonary  No cough or wheeze.  Gastro Intestinal  No nausea, vomitting, or diarrhoea. No bright red blood per rectum, no abdominal pain, change in bowel movement, or constipation.  Genito Urinary  No frequency, urgency, dysuria, no bleeding Musculo Skeletal  No myalgia, arthralgia, joint swelling or pain  Neurologic  No weakness, numbness, change in gait,  Psychology  No depression, anxiety, insomnia.   Vitals:  Blood pressure 140/80, pulse 83, temperature 98.7 F (37.1 C), temperature source Oral, resp. rate 20, height 5\' 4"  (1.626 m), weight 194 lb 11.2 oz (88.3 kg), last menstrual  period 06/02/2005, SpO2 99 %.  Physical Exam: WD in NAD Neck  Supple NROM, without any enlargements.  Lymph Node Survey No cervical supraclavicular or inguinal adenopathy Cardiovascular  Pulse normal rate, regularity and rhythm. S1 and S2 normal.  Lungs  Clear to auscultation bilateraly, without wheezes/crackles/rhonchi. Good air movement.  Skin  No rash/lesions/breakdown  Psychiatry  Alert and oriented to person, place, and time  Abdomen  Normoactive bowel sounds, abdomen soft, non-tender and overweight without evidence of hernia. Well healed incisions. Back No CVA tenderness Genito Urinary  Well healed cuff. No masses. No blood. + rectocele Rectal  deferred Extremities  No bilateral cyanosis, clubbing or edema.   Thereasa Solo, MD  12/22/2017, 1:12 PM

## 2017-12-22 NOTE — Patient Instructions (Signed)
Please return to see Dr Denman George or call her if you have any questions about your surgical recovery [phone]).   You are cleared to return to work. Continue pelvic rest (no intercourse) for an additional 4 weeks.

## 2017-12-24 ENCOUNTER — Other Ambulatory Visit: Payer: Self-pay

## 2017-12-24 ENCOUNTER — Encounter: Payer: Self-pay | Admitting: Radiation Oncology

## 2017-12-24 ENCOUNTER — Ambulatory Visit
Admission: RE | Admit: 2017-12-24 | Discharge: 2017-12-24 | Disposition: A | Discharge status: Home / Self Care | Payer: 59 | Source: Ambulatory Visit | Attending: Radiation Oncology | Admitting: Radiation Oncology

## 2017-12-24 VITALS — BP 113/73 | HR 91 | Temp 98.3°F | Resp 20 | Ht 64.0 in | Wt 196.6 lb

## 2017-12-24 DIAGNOSIS — C50412 Malignant neoplasm of upper-outer quadrant of left female breast: Secondary | ICD-10-CM

## 2017-12-24 DIAGNOSIS — Z170 Estrogen receptor positive status [ER+]: Principal | ICD-10-CM

## 2017-12-24 NOTE — Progress Notes (Addendum)
Pt reports cough that started 2 days prior to the end of radiation. Cough is productive, clear phlegm now. Pt reports mild fatigue. Pt denies c/o pain. Pt reports that skin of breast is hyperpigmented and that skin on shoulder is beginning to tighten. Pt almost completed using Radiaplex in its entirety. Pt encouraged to use Vitamin E oil and cocoa butter after Radiaplex is completed.   BP 113/73 (BP Location: Right Arm, Patient Position: Sitting, Cuff Size: Normal)   Pulse 91   Temp 98.3 F (36.8 C) (Oral)   Resp 20   Ht 5\' 4"  (1.626 m)   Wt 196 lb 9.6 oz (89.2 kg)   LMP 06/02/2005 (Approximate)   SpO2 98%   BMI 33.75 kg/m   Wt Readings from Last 3 Encounters:  12/24/17 196 lb 9.6 oz (89.2 kg)  12/22/17 194 lb 11.2 oz (88.3 kg)  11/23/17 199 lb (90.3 kg)   Loma Sousa, RN BSN

## 2017-12-24 NOTE — Progress Notes (Signed)
Radiation Oncology         [phone] ________________________________  Name: Connie Clarke MRN: [id number]  Date: 12/24/2017  DOB: Jul 11, 1955  Follow-Up Visit Note  CC: Connie Austin, MD  Connie Lose, MD    ICD-10-CM   1. Malignant neoplasm of upper-outer quadrant of left breast in female, estrogen receptor positive ([place]) C50.412    Z17.0     Diagnosis:   62 y.o. female with Stage IA (pT1b,pN0,pM0) invasive ductal carcinoma of the left breast, ER+, PR+, Her2 -, Grade 2  Interval Since Last Radiation:  1 month  Radiation treatment dates:   10/20/2017 - 11/18/2017  Site/dose:    1. Left Breast / 40.05 Gy in 15 fractions 2. Left Breast Boost / 10 Gy in 5 fractions  Narrative:  The patient returns today for routine follow-up. She is doing well overall. She has slowly started going back to work. She was cleared by Dr. Denman Clarke. She is happy with her progression. Pt reports cough that started 2 days prior to the end of radiation. Cough is productive, clear phlegm now. It has been improving and adds she never took antibiotics for relief. She endorses nasal congestion and mild fatigue. Pt reports that skin of breast is hyperpigmented and that skin on shoulder is beginning to tighten. She has almost completed using Radiaplex in its entirety. Pt encouraged to use Vitamin E oil and cocoa butter after Radiaplex is completed. Connie Clarke recently started letrozole and is tolerating it well. She denies fever and pain.                     ALLERGIES:  is allergic to chlorhexidine and steri-strip compound benzoin [benzoin compound].  Meds: Current Outpatient Medications  Medication Sig Dispense Refill  . acetaminophen (TYLENOL) 325 MG tablet Take 650 mg by mouth every 6 (six) hours as needed (FOR PAIN.).    [name] ALPRAZolam (XANAX) 0.25 MG tablet Take 1 tablet (0.25 mg total) by mouth at bedtime as needed for anxiety. 30 tablet 1  . Cholecalciferol (CVS VIT D 5000 HIGH-POTENCY) 5000 units capsule Take  1 capsule (5,000 Units total) by mouth daily.    [name] ibuprofen (ADVIL,MOTRIN) 200 MG tablet Take 200 mg by mouth every 8 (eight) hours as needed (FOR PAIN.).    [name] lisinopril (PRINIVIL,ZESTRIL) 10 MG tablet Take 10 mg by mouth daily.   3  . Multiple Vitamins-Minerals (MULTIVITAMIN WITH MINERALS) tablet Take 1 tablet by mouth daily.    [name] letrozole (FEMARA) 2.5 MG tablet Take 1 tablet (2.5 mg total) by mouth daily. 90 tablet 3  . Turmeric Curcumin 500 MG CAPS Take 500 mg by mouth daily.     No current facility-administered medications for this encounter.     Physical Findings: The patient is in no acute distress. Patient is alert and oriented.  height is '5\' 4"'  (1.626 m) and weight is 196 lb 9.6 oz (89.2 kg). Her oral temperature is 98.3 F (36.8 C). Her blood pressure is 113/73 and her pulse is 91. Her respiration is 20 and oxygen saturation is 98%. .  No significant changes. Lungs are clear to auscultation bilaterally. Heart has regular rate and rhythm. No palpable cervical, supraclavicular, or axillary adenopathy. Abdomen soft, non-tender, normal bowel sounds.  Left Breast: Mild swelling. Mild hyperpigmentation changes. No palpable masses, nipple discharge or bleeding. Skin is well healed.  Lab Findings: Lab Results  Component Value Date   WBC 4.3 11/18/2017   HGB 13.7 11/18/2017   HCT  40.7 11/18/2017   MCV 89.6 11/18/2017   PLT 217 11/18/2017    Radiographic Findings: No results found.  Impression:  No evidence of reoccurrence on clinical exam. Pt is recovering well.    Plan:  PRN follow-up in Radiation Oncology. She will continue close follow up with Medical Oncology and Surgery. She will continue on adjuvant hormonal therapy. Recommend she follow-up with her primary care physician if her nasal congestion and cough become more of a problem.  ____________________________________  Blair Promise, PhD, MD   This document serves as a record of services personally performed by  Blair Promise, PhD, MD. It was created on his behalf by Margit Banda, a trained medical scribe. The creation of this record is based on the scribe's personal observations and the provider's statements to them. This document has been checked and approved by the attending provider.

## 2018-01-27 MED FILL — LISINOPRIL 10 MG TABLET: 10 | 90 days supply | Qty: 90 | Fill #1

## 2018-03-03 ENCOUNTER — Telehealth: Payer: Self-pay

## 2018-03-03 NOTE — Telephone Encounter (Signed)
LVM for pt reminding of SCP visit with NP on 03/12/18 at 2 pm.

## 2018-03-10 DIAGNOSIS — C50912 Malignant neoplasm of unspecified site of left female breast: Secondary | ICD-10-CM | POA: Diagnosis not present

## 2018-03-12 ENCOUNTER — Telehealth: Payer: Self-pay | Admitting: Adult Health

## 2018-03-12 ENCOUNTER — Inpatient Hospital Stay: Payer: 59 | Attending: Hematology and Oncology | Admitting: Adult Health

## 2018-03-12 ENCOUNTER — Encounter: Payer: Self-pay | Admitting: Adult Health

## 2018-03-12 VITALS — BP 138/78 | HR 75 | Temp 97.6°F | Resp 17 | Ht 64.0 in | Wt 195.2 lb

## 2018-03-12 DIAGNOSIS — Z170 Estrogen receptor positive status [ER+]: Secondary | ICD-10-CM | POA: Insufficient documentation

## 2018-03-12 DIAGNOSIS — Z79811 Long term (current) use of aromatase inhibitors: Secondary | ICD-10-CM | POA: Diagnosis not present

## 2018-03-12 DIAGNOSIS — C50412 Malignant neoplasm of upper-outer quadrant of left female breast: Secondary | ICD-10-CM | POA: Insufficient documentation

## 2018-03-12 MED ORDER — ALPRAZOLAM 0.25 MG PO TABS: 0.2500 mg | ORAL_TABLET | Freq: Every evening | ORAL | 1 refills | Status: DC | PRN

## 2018-03-12 MED FILL — ALPRAZolam 0.25 MG TABS: 0.25 | 30 days supply | Qty: 30 | Fill #0

## 2018-03-12 NOTE — Progress Notes (Signed)
CLINIC:  Survivorship   REASON FOR VISIT:  Routine follow-up post-treatment for a recent history of breast cancer.  BRIEF ONCOLOGIC HISTORY:    Malignant neoplasm of upper-outer quadrant of left breast in female, estrogen receptor positive ([place])   08/27/2017 Initial Diagnosis    Screening mammogram left breast showed possible asymmetry.  Targeted ultrasound revealed a 5 x 4 x 7 mm irregular hypoechoic mass 2 o'clock position 5 cm from nipple, no lymphadenopathy, biopsy showed IDC grade 2 with focal DCIS intermediate grade, HER-2 negative ratio 1.32, T1b N0 stage I a clinical stage AJCC 8    09/11/2017 Surgery    Left lumpectomy: 0.7 cm IDC 0/1 lymph node negative, margins negative, grade 2, ER 100%, PR 100%, HER-2 negative ratio 1.32, Ki-67 2%, T1 be N0 stage I a    09/11/2017 Oncotype testing    Oncotype DX recurrence score 20: 6% risk of recurrence with hormone therapy alone    10/20/2017 - 11/18/2017 Radiation Therapy    1. Left Breast / 40.05 Gy in 15 fractions 2. Left Breast Boost / 10 Gy in 5 fractions    12/19/2017 -  Anti-estrogen oral therapy    Letrozole daily    03/11/2018 Cancer Staging    Staging form: Breast, AJCC 8th Edition - Pathologic: Stage IA (pT1b, pN0, cM0, G2, ER+, PR+, HER2-, Oncotype DX score: 20) - Signed by Gardenia Phlegm, NP on 03/11/2018     INTERVAL HISTORY:  Connie Clarke presents to the [facility] today for our initial meeting to review her survivorship care plan detailing her treatment course for breast cancer, as well as monitoring long-term side effects of that treatment, education regarding health maintenance, screening, and overall wellness and health promotion.     Overall, Connie Clarke reports feeling quite well.  She is taking the Letrozole daily.  She notes one hot flash at night at about 3-4 am, and this will wake her up.  This does interfere with her sleep but she is managing it well.  She denies vaginal dryness and  arthralgias.    REVIEW OF SYSTEMS:  Review of Systems  Constitutional: Negative for appetite change, chills, fatigue, fever and unexpected weight change.  HENT:   Negative for hearing loss, lump/mass, sore throat and trouble swallowing.   Eyes: Negative for eye problems and icterus.  Respiratory: Negative for chest tightness, cough and shortness of breath.   Cardiovascular: Negative for chest pain, leg swelling and palpitations.  Gastrointestinal: Negative for abdominal distention, abdominal pain, constipation, diarrhea, nausea and vomiting.  Endocrine: Negative for hot flashes.  Skin: Negative for itching and rash.  Neurological: Negative for dizziness, extremity weakness, headaches and numbness.  Hematological: Negative for adenopathy. Does not bruise/bleed easily.  Psychiatric/Behavioral: Negative for depression. The patient is not nervous/anxious.   Breast: Denies any new nodularity, masses, tenderness, nipple changes, or nipple discharge.      ONCOLOGY TREATMENT TEAM:  1. Surgeon:  Dr. Marlou Starks at [facility] 2. Medical Oncologist: Dr. Lindi Adie  3. Radiation Oncologist: Dr. Sondra Come    PAST MEDICAL/SURGICAL HISTORY:  Past Medical History:  Diagnosis Date  . Anxiety   . Breast cancer, left ([place])   . Coughing    WHITEISH SPUTUM ONSET 11-17-17 PER PATIENT   . Hyperlipidemia   . Hypertension   . Infertility, female   . Joint pain   . Nasal congestion   . Ovarian cyst   . Palpitations    REPORTS NO LONGER OCCURS   . Vitamin D  deficiency    Past Surgical History:  Procedure Laterality Date  . BREAST LUMPECTOMY WITH RADIOACTIVE SEED AND SENTINEL LYMPH NODE BIOPSY Left 09/11/2017   Procedure: LEFT BREAST LUMPECTOMY WITH RADIOACTIVE SEED AND SENTINEL LYMPH NODE BIOPSY;  Surgeon: [name], MD;  Location: [facility];  Service: General;  Laterality: Left;  . DILATION AND CURETTAGE OF UTERUS  1990  . ROBOTIC ASSISTED TOTAL HYSTERECTOMY WITH  BILATERAL SALPINGO OOPHERECTOMY Bilateral 11/23/2017   Procedure: XI ROBOTIC ASSISTED TOTAL HYSTERECTOMY WITH BILATERAL SALPINGO OOPHORECTOMY,;  Surgeon: Everitt Amber, MD;  Location: [facility];  Service: Gynecology;  Laterality: Bilateral;     ALLERGIES:  Allergies  Allergen Reactions  . Chlorhexidine Hives    Surgical prep scrub  . Steri-Strip Compound Benzoin [Benzoin Compound] Hives     CURRENT MEDICATIONS:  Outpatient Encounter Medications as of 03/12/2018  Medication Sig  . acetaminophen (TYLENOL) 325 MG tablet Take 650 mg by mouth every 6 (six) hours as needed (FOR PAIN.).  [name] ALPRAZolam (XANAX) 0.25 MG tablet Take 1 tablet (0.25 mg total) by mouth at bedtime as needed for anxiety.  . Cholecalciferol (CVS VIT D 5000 HIGH-POTENCY) 5000 units capsule Take 1 capsule (5,000 Units total) by mouth daily.  [name] ibuprofen (ADVIL,MOTRIN) 200 MG tablet Take 200 mg by mouth every 8 (eight) hours as needed (FOR PAIN.).  [name] letrozole (FEMARA) 2.5 MG tablet Take 1 tablet (2.5 mg total) by mouth daily.  [name] lisinopril (PRINIVIL,ZESTRIL) 10 MG tablet Take 10 mg by mouth daily.   . Multiple Vitamins-Minerals (MULTIVITAMIN WITH MINERALS) tablet Take 1 tablet by mouth daily.  . Turmeric Curcumin 500 MG CAPS Take 500 mg by mouth daily.   No facility-administered encounter medications on file as of 03/12/2018.      ONCOLOGIC FAMILY HISTORY:  Family History  Problem Relation Age of Onset  . Stroke Mother   . Anxiety disorder Mother   . Obesity Mother   . Diabetes Father   . Hyperlipidemia Father   . Stroke Father   . Depression Father   . Obesity Father   . Melanoma Sister   . Prostate cancer Brother   . Breast cancer Neg Hx      GENETIC COUNSELING/TESTING: Done with Dr. Helane Rima  SOCIAL HISTORY:  Social History   Socioeconomic History  . Marital status: Married    Spouse name: Not on file  . Number of children: Not on file  . Years of education: Not on file  . Highest education level:  Not on file  Occupational History  . Occupation: Physical Diplomatic Services operational officer: [employer]  Social Needs  . Financial resource strain: Not on file  . Food insecurity:    Worry: Not on file    Inability: Not on file  . Transportation needs:    Medical: Not on file    Non-medical: Not on file  Tobacco Use  . Smoking status: Former Smoker    Years: 3.00    Types: Cigarettes    Last attempt to quit: 1980    Years since quitting: 39.8  . Smokeless tobacco: Never Used  Substance and Sexual Activity  . Alcohol use: Yes    Comment: OCC  . Drug use: Never  . Sexual activity: Not on file  Lifestyle  . Physical activity:    Days per week: Not on file    Minutes per session: Not on file  . Stress: Not on file  Relationships  . Social connections:    Talks  on phone: Not on file    Gets together: Not on file    Attends religious service: Not on file    Active member of club or organization: Not on file    Attends meetings of clubs or organizations: Not on file    Relationship status: Not on file  . Intimate partner violence:    Fear of current or ex partner: Not on file    Emotionally abused: Not on file    Physically abused: Not on file    Forced sexual activity: Not on file  Other Topics Concern  . Not on file  Social History Narrative  . Not on file      PHYSICAL EXAMINATION:  Vital Signs:   Vitals:   03/12/18 1349  BP: 138/78  Pulse: 75  Resp: 17  Temp: 97.6 F (36.4 C)  SpO2: 100%   Filed Weights   03/12/18 1349  Weight: 195 lb 3.2 oz (88.5 kg)   General: Well-nourished, well-appearing female in no acute distress.  She is unaccompanied today.   HEENT: Head is normocephalic.  Pupils equal and reactive to light. Conjunctivae clear without exudate.  Sclerae anicteric. Oral mucosa is pink, moist.  Oropharynx is pink without lesions or erythema.  Lymph: No cervical, supraclavicular, or infraclavicular lymphadenopathy noted on palpation.  Cardiovascular:  Regular rate and rhythm.[name] Respiratory: Clear to auscultation bilaterally. Chest expansion symmetric; breathing non-labored.  GI: Abdomen soft and round; non-tender, non-distended. Bowel sounds normoactive.  GU: Deferred.  Neuro: No focal deficits. Steady gait.  Psych: Mood and affect normal and appropriate for situation.  Extremities: No edema. MSK: No focal spinal tenderness to palpation.  Full range of motion in bilateral upper extremities Skin: Warm and dry.  LABORATORY DATA:  None for this visit.  DIAGNOSTIC IMAGING:  None for this visit.      ASSESSMENT AND PLAN:  Ms.. Clarke is a pleasant 62 y.o. female with Stage IA left breast invasive ductal carcinoma, ER+/PR+/HER2-, diagnosed in 07/2017, treated with lumpectomy, adjuvant radiation therapy, and anti-estrogen therapy with Letrozole beginning in 11/2017.  She presents to the Survivorship Clinic for our initial meeting and routine follow-up post-completion of treatment for breast cancer.    1. Stage IA left breast cancer:  Ms. Streetman is continuing to recover from definitive treatment for breast cancer. She will follow-up with her medical oncologist, Dr. Lindi Adie in 6 months with history and physical exam per surveillance protocol.  She will continue her anti-estrogen therapy with Letrozole. Thus far, she is tolerating the Letrozole well, with minimal side effects.  Today, a comprehensive survivorship care plan and treatment summary was reviewed with the patient today detailing her breast cancer diagnosis, treatment course, potential late/long-term effects of treatment, appropriate follow-up care with recommendations for the future, and patient education resources.  A copy of this summary, along with a letter will be sent to the patient's primary care provider via mail/fax/In Basket message after today's visit.    2. Bone health:  Given Ms. Hass's age/history of breast cancer and her current treatment regimen including anti-estrogen therapy  with Letrozole, she is at risk for bone demineralization.  She has undergone D[facility]A with Dr[facility] at [facility].  We do not have copies of these results.  I requested them.  She was given education on specific activities to promote bone health.  3. Cancer screening:  Due to Ms. Dipiero's history and her age, she should receive screening for skin cancers, colon cancer, and gynecologic cancers.  The  information and recommendations are listed on the patient's comprehensive care plan/treatment summary and were reviewed in detail with the patient.    4. Health maintenance and wellness promotion: Ms. Finkle was encouraged to consume 5-7 servings of fruits and vegetables per day. We reviewed the "Nutrition Rainbow" handout, as well as the handout "Take Control of Your Health and Reduce Your Cancer Risk" from the [place].  She was also encouraged to engage in moderate to vigorous exercise for 30 minutes per day most days of the week. We discussed the [employer] fitness program, which is designed for cancer survivors to help them become more physically fit after cancer treatments.  She was instructed to limit her alcohol consumption and continue to abstain from tobacco use.     5. Support services/counseling: It is not uncommon for this period of the patient's cancer care trajectory to be one of many emotions and stressors.  We discussed an opportunity for her to participate in the next session of [facility] ("Finding Your New Normal") support group series designed for patients after they have completed treatment.   Ms. Isaacs was encouraged to take advantage of our many other support services programs, support groups, and/or counseling in coping with her new life as a cancer survivor after completing anti-cancer treatment.  She was offered support today through active listening and expressive supportive counseling.  She was given information regarding our available services and encouraged to  contact me with any questions or for help enrolling in any of our support group/programs.    Dispo:   -Return to cancer center in 6 months for f/u with Dr. Lindi Adie  -Mammogram due in 08/2018 -Follow up with surgery per Dr. Donne Hazel -She is welcome to return back to the Survivorship Clinic at any time; no additional follow-up needed at this time.  -Consider referral back to survivorship as a long-term survivor for continued surveillance  A total of (30) minutes of face-to-face time was spent with this patient with greater than 50% of that time in counseling and care-coordination.   Gardenia Phlegm, NP Survivorship Program [place] [phone]   Note: PRIMARY CARE PROVIDER Darcus Austin, [employer] [phone]

## 2018-03-12 NOTE — Telephone Encounter (Signed)
Gave pt avs and calendar  °

## 2018-03-15 MED FILL — LETROZOLE 2.5 MG TABLET: 2.5 | 90 days supply | Qty: 90 | Fill #1

## 2018-04-19 DIAGNOSIS — E7800 Pure hypercholesterolemia, unspecified: Secondary | ICD-10-CM | POA: Diagnosis not present

## 2018-04-21 MED FILL — LISINOPRIL 10 MG TABS: 10 | 90 days supply | Qty: 90 | Fill #2

## 2018-06-11 MED FILL — LETROZOLE 2.5 MG TABLET: 2.5 | 90 days supply | Qty: 90 | Fill #2

## 2018-07-26 MED FILL — LISINOPRIL 10 MG TABS: 10 | 90 days supply | Qty: 90 | Fill #3

## 2018-08-23 MED FILL — LETROZOLE 2.5 MG TABLET: 2.5 | 90 days supply | Qty: 90 | Fill #0

## 2018-08-27 ENCOUNTER — Ambulatory Visit
Admission: RE | Admit: 2018-08-27 | Discharge: 2018-08-27 | Disposition: A | Discharge status: Home / Self Care | Payer: 59 | Source: Ambulatory Visit | Attending: Adult Health | Admitting: Adult Health

## 2018-08-27 ENCOUNTER — Other Ambulatory Visit: Payer: Self-pay

## 2018-08-27 DIAGNOSIS — C50412 Malignant neoplasm of upper-outer quadrant of left female breast: Secondary | ICD-10-CM | POA: Diagnosis not present

## 2018-08-27 DIAGNOSIS — Z170 Estrogen receptor positive status [ER+]: Principal | ICD-10-CM

## 2018-08-27 HISTORY — DX: Personal history of irradiation: Z92.3

## 2018-09-17 ENCOUNTER — Telehealth: Payer: Self-pay | Admitting: Hematology and Oncology

## 2018-09-17 NOTE — Telephone Encounter (Signed)
Called patient regarding Webex appointment, patient is notified and e-mail has been sent.

## 2018-09-17 NOTE — Progress Notes (Signed)
HEMATOLOGY-ONCOLOGY [place] VISIT PROGRESS NOTE  I connected with Connie Clarke on 09/20/2018 at 10:00 AM EDT by Webex video conference and verified that I am speaking with the correct person using two identifiers.  I discussed the limitations, risks, security and privacy concerns of performing an evaluation and management service by Webex and the availability of in person appointments.  I also discussed with the patient that there may be a patient responsible charge related to this service. The patient expressed understanding and agreed to proceed.  Patient's Location: Home Physician Location: Clinic  CHIEF COMPLIANT: Follow-up on letrozole therapy  INTERVAL HISTORY: Connie Clarke is a 63 y.o. female with above-mentioned history of left breast cancer treated with lumpectomy, radiation, and is currently on antiestrogen therapy with letrozole. I last saw her almost a year ago, and in the interim she had a hysterectomy with bilateral salpingo oophorectomy on 11/24/18. Her most recent mammogram from 08/27/18 showed no evidence of malignancy bilaterally.     Malignant neoplasm of upper-outer quadrant of left breast in female, estrogen receptor positive ([place])   08/27/2017 Initial Diagnosis    Screening mammogram left breast showed possible asymmetry.  Targeted ultrasound revealed a 5 x 4 x 7 mm irregular hypoechoic mass 2 o'clock position 5 cm from nipple, no lymphadenopathy, biopsy showed IDC grade 2 with focal DCIS intermediate grade, HER-2 negative ratio 1.32, T1b N0 stage I a clinical stage AJCC 8    09/11/2017 Surgery    Left lumpectomy: 0.7 cm IDC 0/1 lymph node negative, margins negative, grade 2, ER 100%, PR 100%, HER-2 negative ratio 1.32, Ki-67 2%, T1 be N0 stage I a    09/11/2017 Oncotype testing    Oncotype DX recurrence score 20: 6% risk of recurrence with hormone therapy alone    10/20/2017 - 11/18/2017 Radiation Therapy    1. Left Breast / 40.05 Gy in 15 fractions 2. Left Breast Boost /  10 Gy in 5 fractions    10/2017 Genetic Testing    Done at [facility] with Dr. Helane Rima, negative testing of myriad panel.    12/19/2017 -  Anti-estrogen oral therapy    Letrozole daily    03/11/2018 Cancer Staging    Staging form: Breast, AJCC 8th Edition - Pathologic: Stage IA (pT1b, pN0, cM0, G2, ER+, PR+, HER2-, Oncotype DX score: 20) - Signed by Gardenia Phlegm, NP on 03/11/2018     REVIEW OF SYSTEMS:   Constitutional: Denies fevers, chills or abnormal weight loss Eyes: Denies blurriness of vision Ears, nose, mouth, throat, and face: Denies mucositis or sore throat Respiratory: Denies cough, dyspnea or wheezes Cardiovascular: Denies palpitation, chest discomfort Gastrointestinal:  Denies nausea, heartburn or change in bowel habits Skin: Denies abnormal skin rashes Lymphatics: Denies new lymphadenopathy or easy bruising Neurological:Denies numbness, tingling or new weaknesses Behavioral/Psych: Mood is stable, no new changes  Extremities: No lower extremity edema Breast: denies any pain or lumps or nodules in either breasts All other systems were reviewed with the patient and are negative.  Observations/Objective:  There were no vitals filed for this visit. There is no height or weight on file to calculate BMI.  I have reviewed the data as listed CMP Latest Ref Rng & Units 11/18/2017 06/22/2017 01/05/2017  Glucose 65 - 99 mg/dL 95 106(H) 93  BUN 6 - 20 mg/dL '20 22 23  ' Creatinine 0.44 - 1.00 mg/dL 0.72 0.82 0.85  Sodium 135 - 145 mmol/L 142 141 139  Potassium 3.5 - 5.1 mmol/L 4.2 5.0  4.5  Chloride 101 - 111 mmol/L 109 102 103  CO2 22 - 32 mmol/L '27 24 23  ' Calcium 8.9 - 10.3 mg/dL 9.1 9.8 9.5  Total Protein 6.5 - 8.1 g/dL 6.9 7.0 6.6  Total Bilirubin 0.3 - 1.2 mg/dL 0.5 0.3 0.4  Alkaline Phos 38 - 126 U/L 84 90 84  AST 15 - 41 U/L 35 29 33  ALT 14 - 54 U/L 51 55(H) 56(H)    Lab Results  Component Value Date   WBC 4.3 11/18/2017   HGB 13.7 11/18/2017    HCT 40.7 11/18/2017   MCV 89.6 11/18/2017   PLT 217 11/18/2017   NEUTROABS 4.8 05/21/2016      Assessment Plan:  Malignant neoplasm of upper-outer quadrant of left breast in female, estrogen receptor positive ([place]) 09/11/2017:Left lumpectomy: 0.7 cm IDC 0/1 lymph node negative, margins negative, grade 2, ER 100%, PR 100%, HER-2 negative ratio 1.32, Ki-67 2%, T1 be N0 stage I a Oncotype DX score 20: 6%.risk of recurrence Adjuvant radiation therapy: 10/20/2017-11/18/2017  Current treatment: Adjuvant antiestrogen therapy with letrozole 2.5 mg daily started 12/19/2017 Letrozole toxicities: Joint stiffness improves with activity Patient would like to switch her blood pressure medication from lisinopril to Coreg.  This is because she is concerned about coronavirus impact on lisinopril treated patients.  I also believe that with her stress and anxiety levels, Coreg may be a better choice as well.  She also has high cholesterol and is reluctant to take cholesterol medication.  I recommended and suggested that she discuss this with her primary care physician.  She certainly is at higher risk because she is taking antiestrogen therapy which tends to increase the cholesterol levels.  It may be ideal for her to take a statin.  Bone density: June 2019: Dr. Rolin Barry office: T score -1.1 Breast cancer surveillance: Mammogram 08/27/2018: Benign, breast density category B  Return to clinic in 1 year for follow-up    I discussed the assessment and treatment plan with the patient. The patient was provided an opportunity to ask questions and all were answered. The patient agreed with the plan and demonstrated an understanding of the instructions. The patient was advised to call back or seek an in-person evaluation if the symptoms worsen or if the condition fails to improve as anticipated.   I provided 20 minutes of face-to-face Web Ex time during this encounter.    Rulon Eisenmenger, MD 09/20/2018    I, Molly  Dorshimer, am acting as scribe for Nicholas Lose, MD.  I have reviewed the above documentation for accuracy and completeness, and I agree with the above.

## 2018-09-20 ENCOUNTER — Inpatient Hospital Stay: Payer: 59 | Attending: Hematology and Oncology | Admitting: Hematology and Oncology

## 2018-09-20 DIAGNOSIS — Z90722 Acquired absence of ovaries, bilateral: Secondary | ICD-10-CM | POA: Diagnosis not present

## 2018-09-20 DIAGNOSIS — Z923 Personal history of irradiation: Secondary | ICD-10-CM | POA: Diagnosis not present

## 2018-09-20 DIAGNOSIS — Z90710 Acquired absence of both cervix and uterus: Secondary | ICD-10-CM | POA: Diagnosis not present

## 2018-09-20 DIAGNOSIS — C50412 Malignant neoplasm of upper-outer quadrant of left female breast: Secondary | ICD-10-CM

## 2018-09-20 DIAGNOSIS — Z170 Estrogen receptor positive status [ER+]: Secondary | ICD-10-CM

## 2018-09-20 DIAGNOSIS — Z79811 Long term (current) use of aromatase inhibitors: Secondary | ICD-10-CM

## 2018-09-20 MED ORDER — ALPRAZOLAM 0.25 MG PO TABS: 0.2500 mg | ORAL_TABLET | Freq: Every evening | ORAL | 3 refills | Status: DC | PRN

## 2018-09-20 MED ORDER — CARVEDILOL 3.125 MG PO TABS
3.1250 mg | ORAL_TABLET | Freq: Two times a day (BID) | ORAL | 3 refills | Status: DC
Start: 2018-09-20 — End: 2018-11-01

## 2018-09-20 MED FILL — ALPRAZolam 0.25 MG TABS: 0.25 | 30 days supply | Qty: 30 | Fill #0

## 2018-09-20 MED FILL — CARVEDILOL 3.125 MG TABLET: 3.125 | 30 days supply | Qty: 60 | Fill #0

## 2018-09-20 NOTE — Assessment & Plan Note (Signed)
09/11/2017:Left lumpectomy: 0.7 cm IDC 0/1 lymph node negative, margins negative, grade 2, ER 100%, PR 100%, HER-2 negative ratio 1.32, Ki-67 2%, T1 be N0 stage I a Oncotype DX score 20: 6%.risk of recurrence Adjuvant radiation therapy: 10/20/2017-11/18/2017  Current treatment: Adjuvant antiestrogen therapy with letrozole 2.5 mg daily started 12/19/2017 Letrozole toxicities:  Bone density: June 2019: Dr. Graywall's office: T score -1.1 Breast cancer surveillance: Mammogram 08/27/2018: Benign, breast density category B  Return to clinic in 1 year for follow-up 

## 2018-09-21 ENCOUNTER — Telehealth: Payer: Self-pay | Admitting: Family Medicine

## 2018-09-21 NOTE — Telephone Encounter (Signed)
I called [facility]) and left message on patient voicemail to please call office to schedule an appointment to establish care with Dr. Deborra Medina per Dr. Deborra Medina request.     ===View-only below this line=== ----- Message ----- From: Guido Sander D Sent: 09/21/2018   2:28 PM EDT To: Aldona Lento   ----- Message ----- From: Lucille Passy, MD Sent: 09/20/2018  10:42 AM EDT To: Billee Cashing, Nicholas Lose, MD  Hi Vinay, So good to hear from you.  I hope you and your family are well and staying healthy. I'd be honored to see her.  I am attaching our Biochemist, clinical to this message to help her to get scheduled with me. Take care, Talia

## 2018-10-06 ENCOUNTER — Encounter: Payer: Self-pay | Admitting: Hematology and Oncology

## 2018-10-29 NOTE — Progress Notes (Signed)
° °Virtual Visit via Video  ° °Due to the COVID-19 pandemic, this visit was completed with telemedicine (audio/video) technology to reduce patient and provider exposure as well as to preserve personal protective equipment.  ° °I connected with Deseray K Hecker by a video enabled telemedicine application and verified that I am speaking with the correct person using two identifiers. °Location patient: Home °Location provider: [facility], Office °Persons participating in the virtual visit: Jenna K Blizard, Talia Aron, MD  ° °I discussed the limitations of evaluation and management by telemedicine and the availability of in person appointments. The patient expressed understanding and agreed to proceed. ° °Care Team  ° °Patient Care Team: °Aron, Talia M, MD as PCP - General (Family Medicine) °Gudena, Vinay, MD as Consulting Physician (Hematology and Oncology) °Causey, Lindsey Cornetto, NP as Nurse Practitioner (Hematology and Oncology) °Wakefield, Matthew, MD as Consulting Physician (General Surgery) °Kinard, James, MD as Consulting Physician (Radiation Oncology) ° °Subjective:  ° °HPI:  ° °Here to establish care.   °Chart reviewed extensively-  ° °Left breast cancer -HER2- followed by Dr. Gudena. °She last saw him on 09/20/18 via virtual visit.  Note reviewed.  Remote h/o  lumpectomy, radiation, and is currently on antiestrogen therapy with letrozole (started on 12/19/17). She has also had a hysterectomy with bilateral salpingo oophorectomy on 11/24/18. Her most recent mammogram from 08/27/18 showed no evidence of malignancy bilaterally.  °DEXA 10/2017- T score-1.1 °At that April OV with Dr. Gudena, she asked to be switched from lisinopril (which she has been taking for years) to Coreg because of initial concerns about ACE inhibitors impacting patient outcomes with coronovirus.  She did not like how the coreg made her feel.  BP was not well controlled and she felt it was making her more anxious.  She has since stopped coreg  and restarted lisinopril.  ° °HLD- was on simvastatin for years but wanted to try a more "natural way."  Cholesterol has been high.  Went to healthy weight clinic, has lost weight but unsure what her cholesterol is currently.  She does not remember having myalgias with statins and is willing to take a statin if necessary. °Lab Results  °Component Value Date  ° CHOL 246 (H) 06/22/2017  ° HDL 65 06/22/2017  ° LDLCALC 161 (H) 06/22/2017  ° TRIG 102 06/22/2017  ° °The 10-year ASCVD risk score (Goff DC Jr., et al., 2013) is: 5.2% °  Values used to calculate the score: °    Age: 63 years °    Sex: Female °    Is Non-Hispanic African American: No °    Diabetic: No °    Tobacco smoker: No °    Systolic Blood Pressure: 122 mmHg °    Is BP treated: Yes °    HDL Cholesterol: 65 mg/dL °    Total Cholesterol: 246 mg/dL ° °History of anxiety/depression- she was on celexa years ago which worked well but she didn't want to take something everyday when she felt better.  She feels her anxiety has worsened since the cancer diagnosis.  The pandemic also made her more anxious. °She worries a lot, does not sleep well which makes it worse- hard time falling asleep and staying asleep. °She denies feeling depressed.  No SI or HI. °GAD 7 : Generalized Anxiety Score 11/01/2018  °Nervous, Anxious, on Edge 2  °Control/stop worrying 1  °Worry too much - different things 1  °Trouble relaxing 2  °Restless 0  °Easily annoyed or irritable 1  °  Afraid - awful might happen 2  °Total GAD 7 Score 9  °Anxiety Difficulty Somewhat difficult  ° ° °Depression screen PHQ 2/9 11/01/2018 12/24/2017 09/21/2017 05/21/2016  °Decreased Interest 0 0 0 2  °Down, Depressed, Hopeless 0 0 0 3  °PHQ - 2 Score 0 0 0 5  °Altered sleeping - - - 1  °Tired, decreased energy - - - 2  °Change in appetite - - - 3  °Feeling bad or failure about yourself  - - - 3  °Trouble concentrating - - - 1  °Moving slowly or fidgety/restless - - - 1  °Suicidal thoughts - - - 0  °PHQ-9 Score - - - 16   ° ° ° °Insomnia- difficulty falling and staying asleep since the cancer diagnosis which has also been heightened with the pandemic. ° °History of Vitamin D deficiency- currently taking 4000 IU daily. °Due for labs. ° °HTN- well controlled on Lisinopril 10 mg daily. °Lab Results  °Component Value Date  ° CREATININE 0.72 11/18/2017  ° ° °Review of Systems  °Constitutional: Positive for malaise/fatigue.  °HENT: Negative.   °Respiratory: Negative.   °Cardiovascular: Negative.   °Gastrointestinal: Negative.   °Genitourinary: Negative.   °Musculoskeletal: Positive for joint pain.  °Skin: Negative.   °Neurological: Negative.   °Endo/Heme/Allergies: Negative.   °Psychiatric/Behavioral: Negative for depression, hallucinations, memory loss, substance abuse and suicidal ideas. The patient is nervous/anxious and has insomnia.   °All other systems reviewed and are negative. °  ° °Patient Active Problem List  ° Diagnosis Date Noted  °• HLD (hyperlipidemia) 11/01/2018  °• Insomnia 11/01/2018  °• Right ovarian cyst 11/23/2017  °• Malignant neoplasm of upper-outer quadrant of left breast in female, estrogen receptor positive (HCC) 09/01/2017  °• Other hyperlipidemia 05/14/2017  °• Essential hypertension 05/14/2017  °• Elevated LFTs 05/14/2017  °• Vitamin D deficiency 01/26/2017  °• Insulin resistance 08/04/2016  °• LFT elevation 08/04/2016  °• Class 1 obesity with body mass index (BMI) of 32.0 to 32.9 in adult 07/16/2016  °• PALPITATIONS 04/16/2009  °• HYPERCHOLESTEROLEMIA 04/13/2009  °• OBESITY 04/13/2009  °• Depression with anxiety 04/13/2009  °• HYPERTENSION 04/13/2009  °• ALLERGIC RHINITIS 04/13/2009  °  °Social History  ° °Tobacco Use  °• Smoking status: Former Smoker  °  Years: 3.00  °  Types: Cigarettes  °  Last attempt to quit: 1980  °  Years since quitting: 40.4  °• Smokeless tobacco: Never Used  °Substance Use Topics  °• Alcohol use: Yes  °  Comment: OCC  ° ° °Current Outpatient Medications:  °•  acetaminophen (TYLENOL)  325 MG tablet, Take 650 mg by mouth every 6 (six) hours as needed (FOR PAIN.)., Disp: , Rfl:  °•  ALPRAZolam (XANAX) 0.25 MG tablet, Take 1 tablet (0.25 mg total) by mouth at bedtime as needed for anxiety., Disp: 30 tablet, Rfl: 3 °•  Cholecalciferol ([employer] VIT D 5000 HIGH-POTENCY) 5000 units capsule, Take 1 capsule (5,000 Units total) by mouth daily., Disp: , Rfl:  °•  ibuprofen (ADVIL,MOTRIN) 200 MG tablet, Take 200 mg by mouth every 8 (eight) hours as needed (FOR PAIN.)., Disp: , Rfl:  °•  letrozole (FEMARA) 2.5 MG tablet, Take 1 tablet (2.5 mg total) by mouth daily., Disp: 90 tablet, Rfl: 3 °•  lisinopril (ZESTRIL) 10 MG tablet, Take 1 tablet (10 mg total) by mouth daily., Disp: 90 tablet, Rfl: 3 °•  Multiple Vitamins-Minerals (MULTIVITAMIN WITH MINERALS) tablet, Take 1 tablet by mouth daily., Disp: , Rfl:  °•  Turmeric   Turmeric Curcumin 500 MG CAPS, Take 500 mg by mouth daily., Disp: , Rfl:   Allergies  Allergen Reactions   Chlorhexidine Hives    Surgical prep scrub   Steri-Strip Compound Benzoin [Benzoin Compound] Hives    Objective:  BP 122/80    Pulse 72    Ht 5' 4" (1.626 m)    Wt 190 lb (86.2 kg)    LMP 06/02/2005 (Approximate)    BMI 32.61 kg/m   Wt Readings from Last 3 Encounters:  11/01/18 190 lb (86.2 kg)  03/12/18 195 lb 3.2 oz (88.5 kg)  12/24/17 196 lb 9.6 oz (89.2 kg)    VITALS: Per patient if applicable, see vitals. GENERAL: Alert, appears well and in no acute distress. HEENT: Atraumatic, conjunctiva clear, no obvious abnormalities on inspection of external nose and ears. NECK: Normal movements of the head and neck. CARDIOPULMONARY: No increased WOB. Speaking in clear sentences. I:E ratio WNL.  MS: Moves all visible extremities without noticeable abnormality. PSYCH: Pleasant and cooperative, well-groomed. Speech normal rate and rhythm. Affect is appropriate. Insight and judgement are appropriate. Attention is focused, linear, and appropriate. She does seem a bit anxious. NEURO: CN  grossly intact. Oriented as arrived to appointment on time with no prompting. Moves both UE equally.  SKIN: No obvious lesions, wounds, erythema, or cyanosis noted on face or hands.    Assessment and Plan:   Laquinda was seen today for new patient (initial visit).  Diagnoses and all orders for this visit:  HYPERCHOLESTEROLEMIA -     Comp Met (CMET); Future -     CBC w/Diff; Future -     Lipid Profile; Future  Insulin resistance -     HgB A1c; Future -     Insulin, random; Future  Essential hypertension -     Comp Met (CMET); Future -     CBC w/Diff; Future -     Lipid Profile; Future  Vitamin D deficiency -     VITAMIN D 25 Hydroxy (Vit-D Deficiency, Fractures); Future  Hyperlipidemia, unspecified hyperlipidemia type  Depression with anxiety  Insomnia, unspecified type  Other orders -     lisinopril (ZESTRIL) 10 MG tablet; Take 1 tablet (10 mg total) by mouth daily.     COVID-19 Education: The signs and symptoms of COVID-19 were discussed with the patient and how to seek care for testing if needed. The importance of social distancing was discussed today.  Reviewed expectations re: course of current medical issues.  Discussed self-management of symptoms.  Outlined signs and symptoms indicating need for more acute intervention.  Patient verbalized understanding and all questions were answered.  Health Maintenance issues including appropriate healthy diet, exercise, and smoking avoidance were discussed with patient.  See orders for this visit as documented in the electronic medical record.  Arnette Norris, MD  Records requested if needed. Time spent: 45 minutes, of which >50% was spent in obtaining information about her symptoms, reviewing her previous labs, evaluations, and treatments, counseling her about her condition (please see the discussed topics above), and developing a plan to further investigate it; she had a number of questions which I addressed.

## 2018-11-01 ENCOUNTER — Telehealth (INDEPENDENT_AMBULATORY_CARE_PROVIDER_SITE_OTHER): Payer: 59 | Admitting: Family Medicine

## 2018-11-01 VITALS — BP 122/80 | HR 72 | Ht 64.0 in | Wt 190.0 lb

## 2018-11-01 DIAGNOSIS — F411 Generalized anxiety disorder: Secondary | ICD-10-CM | POA: Diagnosis not present

## 2018-11-01 DIAGNOSIS — I10 Essential (primary) hypertension: Secondary | ICD-10-CM

## 2018-11-01 DIAGNOSIS — C50412 Malignant neoplasm of upper-outer quadrant of left female breast: Secondary | ICD-10-CM | POA: Diagnosis not present

## 2018-11-01 DIAGNOSIS — E8881 Metabolic syndrome: Secondary | ICD-10-CM | POA: Diagnosis not present

## 2018-11-01 DIAGNOSIS — G4700 Insomnia, unspecified: Secondary | ICD-10-CM

## 2018-11-01 DIAGNOSIS — E559 Vitamin D deficiency, unspecified: Secondary | ICD-10-CM

## 2018-11-01 DIAGNOSIS — E785 Hyperlipidemia, unspecified: Secondary | ICD-10-CM | POA: Insufficient documentation

## 2018-11-01 DIAGNOSIS — E7800 Pure hypercholesterolemia, unspecified: Secondary | ICD-10-CM | POA: Diagnosis not present

## 2018-11-01 DIAGNOSIS — Z170 Estrogen receptor positive status [ER+]: Secondary | ICD-10-CM | POA: Diagnosis not present

## 2018-11-01 MED ORDER — TRAZODONE HCL 50 MG PO TABS: 25.0000 mg | ORAL_TABLET | Freq: Every evening | ORAL | 3 refills | Status: DC | PRN

## 2018-11-01 MED ORDER — LISINOPRIL 10 MG PO TABS: 10.0000 mg | ORAL_TABLET | Freq: Every day | ORAL | 3 refills | Status: DC

## 2018-11-01 MED FILL — traZODone HCL 50 MG TABS: 50 | 90 days supply | Qty: 90 | Fill #0

## 2018-11-01 MED FILL — LISINOPRIL 10 MG TABS: 10 | 90 days supply | Qty: 90 | Fill #0

## 2018-11-01 NOTE — Assessment & Plan Note (Signed)
She is willing to restart a statin.  Will order labs- she will come in for these today. Orders Placed This Encounter  Procedures  . Comp Met (CMET)  . CBC w/Diff  . Lipid Profile  . HgB A1c  . Insulin, random  . VITAMIN D 25 Hydroxy (Vit-D Deficiency, Fractures)

## 2018-11-01 NOTE — Assessment & Plan Note (Signed)
Currently taking 4000 IU daily.  She is unsure if she is tired because of her Vit D being low or from not sleeping well.  Will check her Vitamin D as well today. The patient indicates understanding of these issues and agrees with the plan.

## 2018-11-01 NOTE — Assessment & Plan Note (Signed)
  The problem of recurrent insomnia is discussed. Avoidance of caffeine sources is strongly encouraged. Sleep hygiene issues are reviewed.  eRx sent for trazodone 25-50 mg qhs prn insomnia.  She will update me in 1 week. The patient indicates understanding of these issues and agrees with the plan.

## 2018-11-01 NOTE — Assessment & Plan Note (Signed)
Labs today

## 2018-11-01 NOTE — Assessment & Plan Note (Signed)
Her GAD 7 score did indicate significant anxiety, PHQ9 depression screen was reassuring.  She has been seeing a therapist intermittently, who even tried EMDR with her.  We talked at length about her anxiety and contributing factors.   We decided to treat her insomnia prior to restarting celexa.   See below. She will update me in 1 week and again in 2 weeks. GAD 7 : Generalized Anxiety Score 11/01/2018  Nervous, Anxious, on Edge 2  Control/stop worrying 1  Worry too much - different things 1  Trouble relaxing 2  Restless 0  Easily annoyed or irritable 1  Afraid - awful might happen 2  Total GAD 7 Score 9  Anxiety Difficulty Somewhat difficult      Video Visit from 11/01/2018 in [facility]  PHQ-2 Total Score  0

## 2018-11-01 NOTE — Assessment & Plan Note (Addendum)
Under excellent care with Dr. Lindi Adie.  She is currently taking femara- some joint stiffness but otherwise feels she is tolerating this okay. >45 minutes spent in face to face time with patient, >50% spent in counselling or coordination of care discussing her medical history with breast cancer, her current concerns about her HLD, insomnia, anxiety, insulin resistance and HTN.

## 2018-11-02 ENCOUNTER — Other Ambulatory Visit (INDEPENDENT_AMBULATORY_CARE_PROVIDER_SITE_OTHER): Payer: 59

## 2018-11-02 DIAGNOSIS — E7800 Pure hypercholesterolemia, unspecified: Secondary | ICD-10-CM | POA: Diagnosis not present

## 2018-11-02 DIAGNOSIS — I10 Essential (primary) hypertension: Secondary | ICD-10-CM

## 2018-11-02 DIAGNOSIS — E8881 Metabolic syndrome: Secondary | ICD-10-CM | POA: Diagnosis not present

## 2018-11-02 DIAGNOSIS — E559 Vitamin D deficiency, unspecified: Secondary | ICD-10-CM | POA: Diagnosis not present

## 2018-11-02 DIAGNOSIS — E88819 Insulin resistance, unspecified: Secondary | ICD-10-CM

## 2018-11-02 LAB — COMPREHENSIVE METABOLIC PANEL
ALT: 43 U/L — ABNORMAL HIGH (ref 0–35)
AST: 23 U/L (ref 0–37)
Albumin: 4.2 g/dL (ref 3.5–5.2)
Alkaline Phosphatase: 99 U/L (ref 39–117)
BUN: 26 mg/dL — ABNORMAL HIGH (ref 6–23)
CO2: 29 mEq/L (ref 19–32)
Calcium: 9.6 mg/dL (ref 8.4–10.5)
Chloride: 102 mEq/L (ref 96–112)
Creatinine, Ser: 0.79 mg/dL (ref 0.40–1.20)
GFR: 73.51 mL/min (ref >60.00)
Glucose, Bld: 98 mg/dL (ref 70–99)
Potassium: 4.5 mEq/L (ref 3.5–5.1)
Sodium: 138 mEq/L (ref 135–145)
Total Bilirubin: 0.5 mg/dL (ref 0.2–1.2)
Total Protein: 7.1 g/dL (ref 6.0–8.3)

## 2018-11-02 LAB — LIPID PANEL
Cholesterol: 259 mg/dL — ABNORMAL HIGH (ref 0–200)
HDL: 58.2 mg/dL (ref >39.00)
LDL Cholesterol: 175 mg/dL — ABNORMAL HIGH (ref 0–99)
NonHDL: 200.41
Total CHOL/HDL Ratio: 4
Triglycerides: 128 mg/dL (ref 0.0–149.0)
VLDL: 25.6 mg/dL (ref 0.0–40.0)

## 2018-11-02 LAB — VITAMIN D 25 HYDROXY (VIT D DEFICIENCY, FRACTURES): VITD: 75.67 ng/mL (ref 30.00–100.00)

## 2018-11-02 LAB — CBC WITH DIFFERENTIAL/PLATELET
Basophils Absolute: 0 10*3/uL (ref 0.0–0.1)
Basophils Relative: 0.7 % (ref 0.0–3.0)
Eosinophils Absolute: 0.1 10*3/uL (ref 0.0–0.7)
Eosinophils Relative: 2.3 % (ref 0.0–5.0)
HCT: 42.4 % (ref 36.0–46.0)
Hemoglobin: 14.5 g/dL (ref 12.0–15.0)
Lymphocytes Relative: 26.3 % (ref 12.0–46.0)
Lymphs Abs: 1.3 10*3/uL (ref 0.7–4.0)
MCHC: 34.3 g/dL (ref 30.0–36.0)
MCV: 88.2 fl (ref 78.0–100.0)
Monocytes Absolute: 0.4 10*3/uL (ref 0.1–1.0)
Monocytes Relative: 8.3 % (ref 3.0–12.0)
Neutro Abs: 3.2 10*3/uL (ref 1.4–7.7)
Neutrophils Relative %: 62.4 % (ref 43.0–77.0)
Platelets: 254 10*3/uL (ref 150.0–400.0)
RBC: 4.81 Mil/uL (ref 3.87–5.11)
RDW: 13.1 % (ref 11.5–15.5)
WBC: 5.1 10*3/uL (ref 4.0–10.5)

## 2018-11-02 LAB — HEMOGLOBIN A1C: Hgb A1c MFr Bld: 5.6 % (ref 4.6–6.5)

## 2018-11-03 LAB — INSULIN, RANDOM: Insulin: 13.6 u[IU]/mL

## 2018-11-09 ENCOUNTER — Encounter: Payer: Self-pay | Admitting: Family Medicine

## 2018-12-14 ENCOUNTER — Other Ambulatory Visit: Payer: Self-pay | Admitting: Hematology and Oncology

## 2018-12-14 MED FILL — ALPRAZolam 0.25 MG TABS: 0.25 | 30 days supply | Qty: 30 | Fill #1

## 2018-12-14 MED FILL — LETROZOLE 2.5 MG TABLET: 2.5 | 90 days supply | Qty: 90 | Fill #0

## 2018-12-15 ENCOUNTER — Other Ambulatory Visit: Payer: Self-pay | Admitting: *Deleted

## 2018-12-15 MED ORDER — LETROZOLE 2.5 MG PO TABS: 2.5000 mg | ORAL_TABLET | Freq: Every day | ORAL | 2 refills | Status: DC

## 2019-01-18 IMAGING — US US BREAST BX W LOC DEV 1ST LESION IMG BX SPEC US GUIDE*L*
1 series · 12 of 12 positions shown · non-contrast
Comparison: Previous exam(s).

ADDENDUM:
Pathology revealed GRADE II - INVASIVE DUCTAL CARCINOMA,
INTERMEDIATE GRADE - FOCAL DUCTAL CARCINOMA IN SITU of Left breast,
upper outer quadrant, 2 o'clock, 5 cmfn. This was found to be
concordant by Dr. Marko Mali Grce. Pathology results were discussed
with the patient by telephone. The patient reported doing well after
the biopsy with tenderness at the site. Post biopsy instructions and
care were reviewed and questions were answered. The patient was
encouraged to call The [REDACTED] for any
additional concerns. Surgical consultation has been arranged with
Dr. Nomasibulele Moatshe, per patient request, at [REDACTED] on September 11, 2017. The patient has an appointment with Dr
Kabayama Alpire, per patient request, on September 01, 2017.

Pathology results reported by Tsegai Gonen, RN on 08/31/2017.
CLINICAL DATA: Left breast mass
EXAM:
ULTRASOUND GUIDED LEFT BREAST CORE NEEDLE BIOPSY

[Series 1: us breast bx w loc dev 1st lesion img bx spec us g · 0.07mm/px · 12 of 12 slices shown]
[im 1/12]
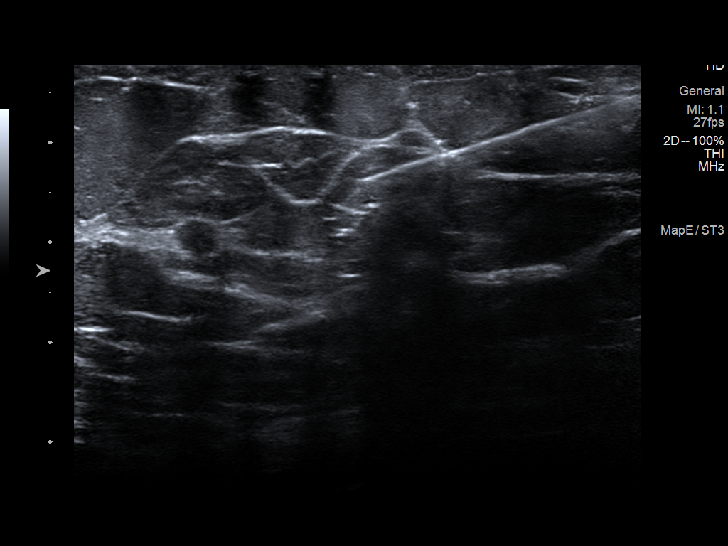
[im 2/12]
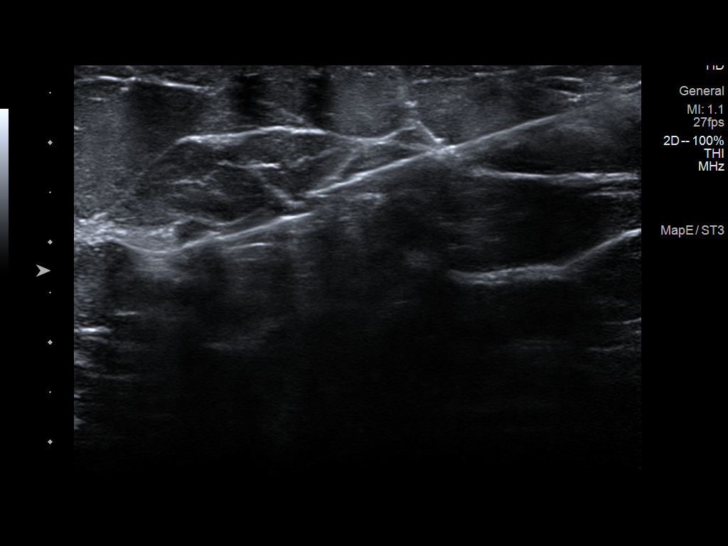
[im 3/12]
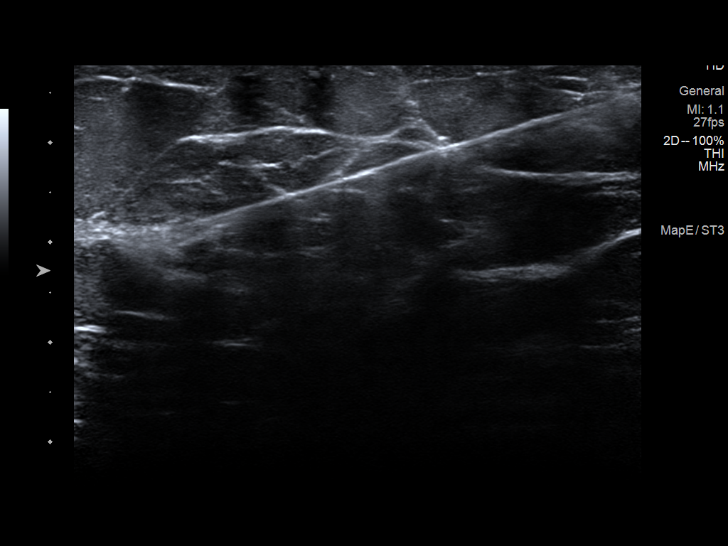
[im 4/12]
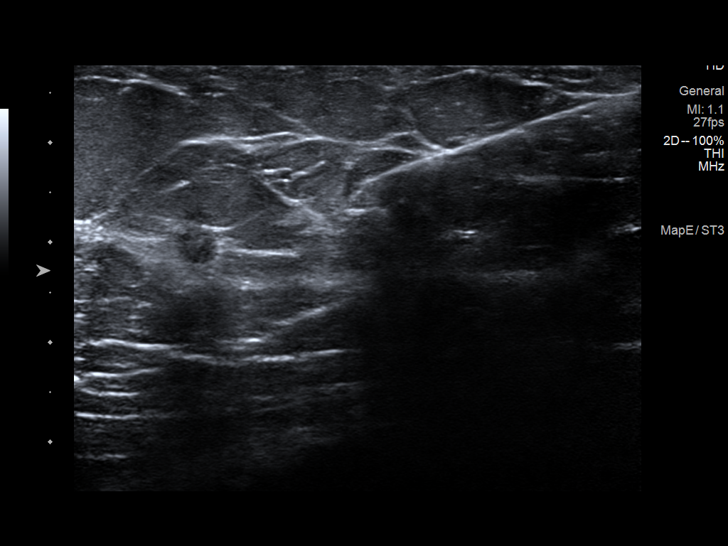
[im 5/12]
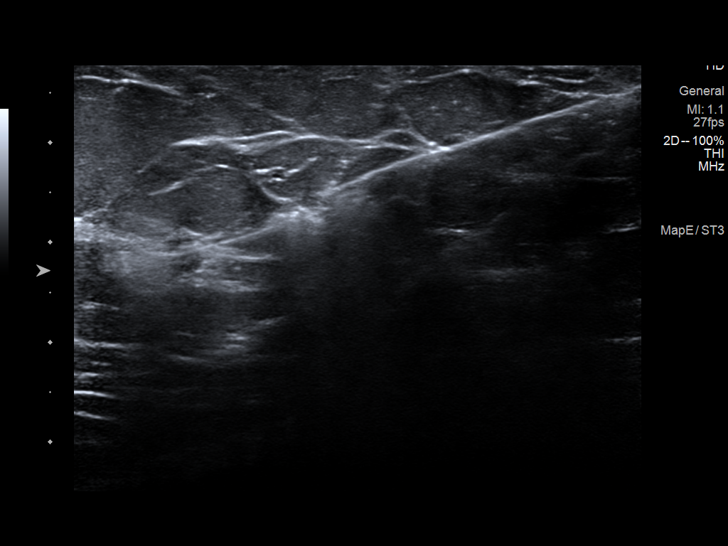
[im 6/12]
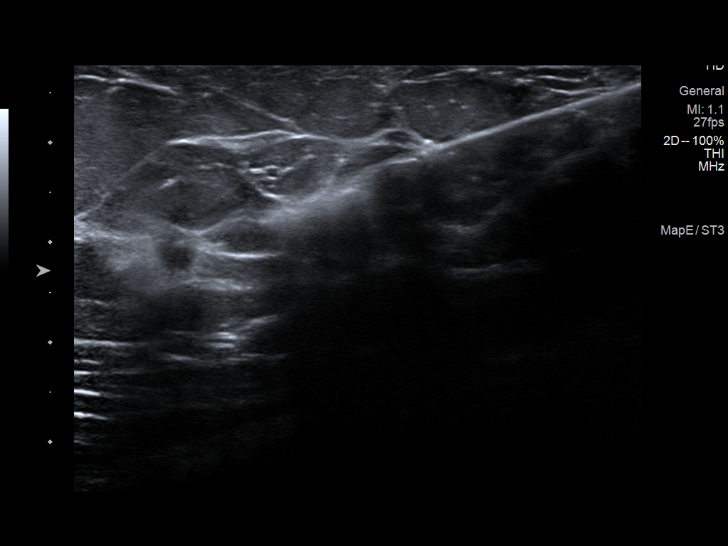
[im 7/12]
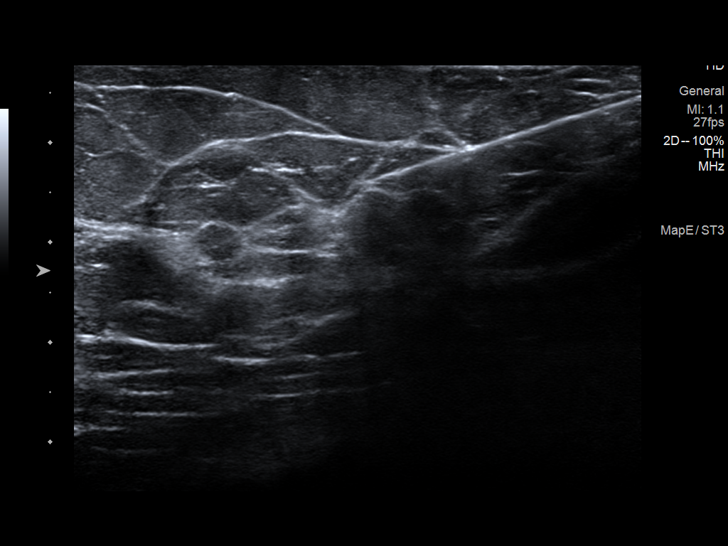
[im 8/12]
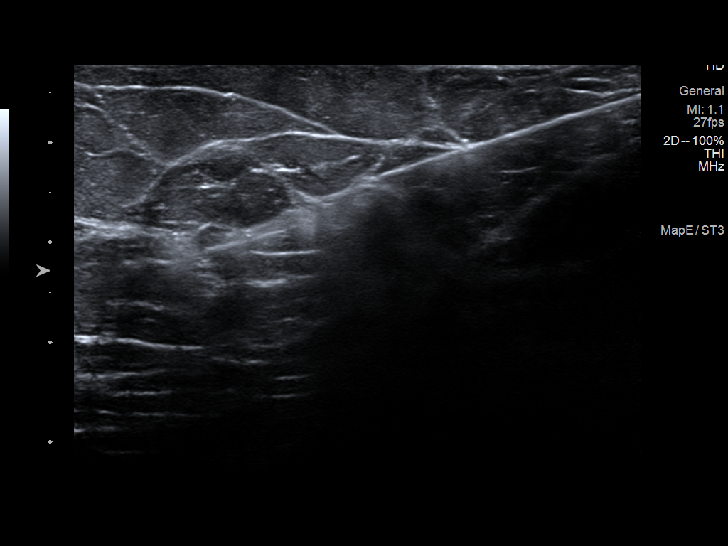
[im 9/12]
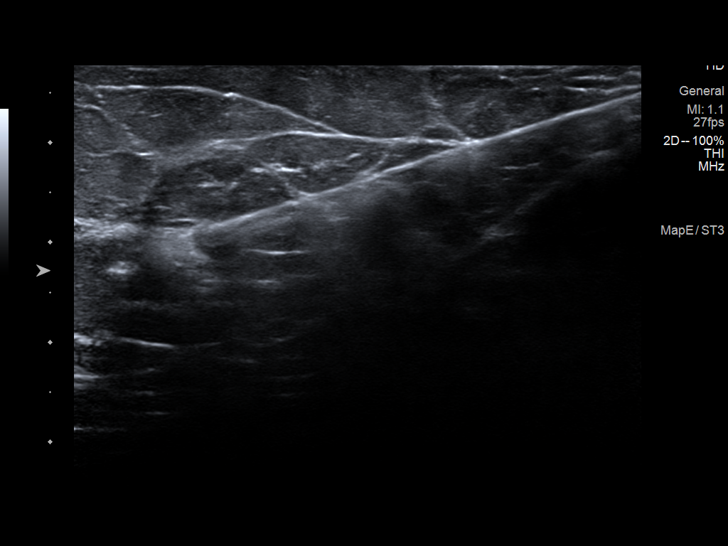
[im 10/12]
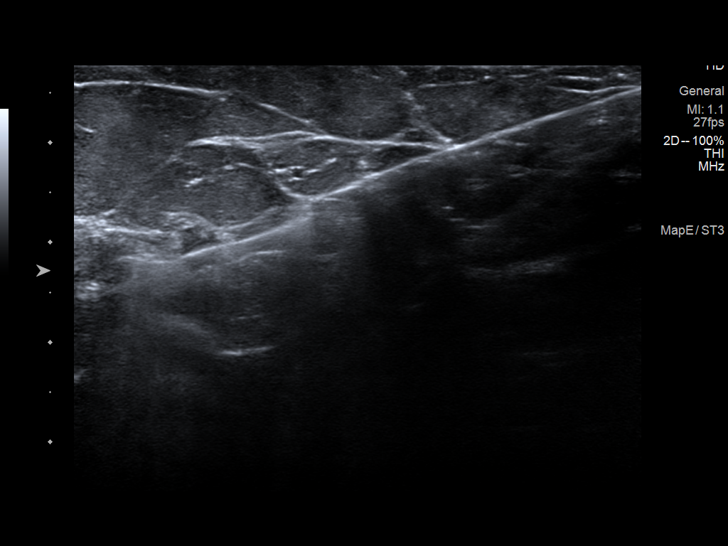
[im 11/12]
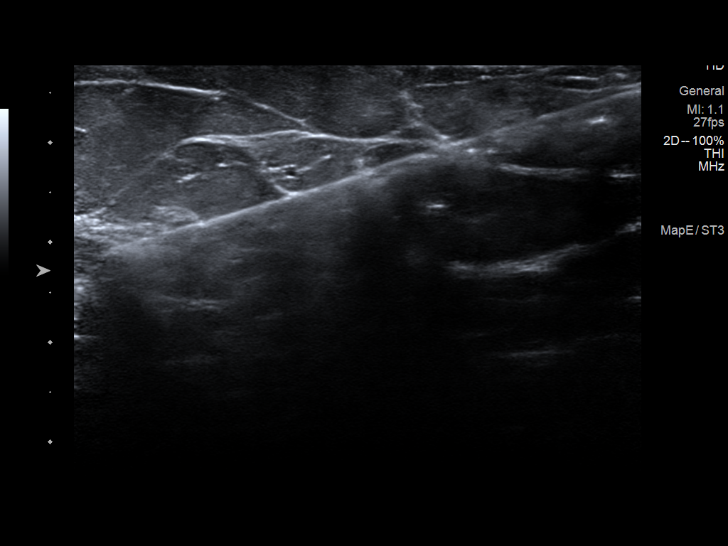
[im 12/12]
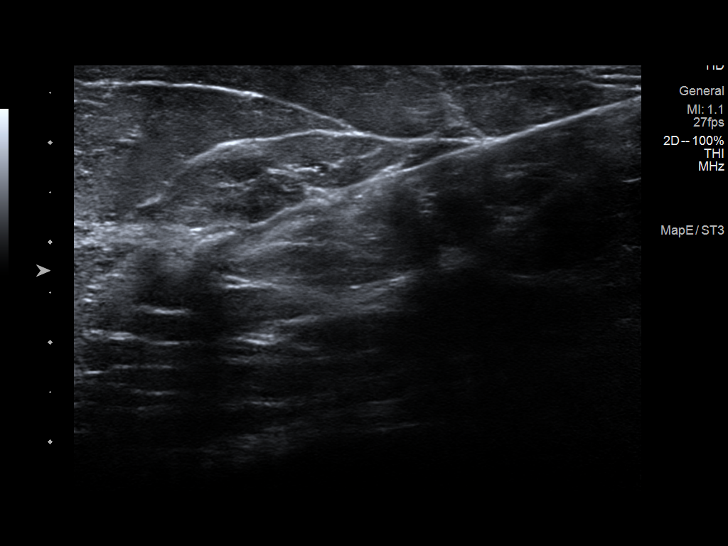

[12 of 12 positions shown; findings below may reference images not displayed]



Lesion quadrant: Upper-outer

Using sterile technique and 1% Lidocaine as local anesthetic, under
direct ultrasound visualization, a 12 gauge Nya device was
used to perform biopsy of a left breast mass using a lateral
approach. At the conclusion of the procedure a heart shaped tissue
marker clip was deployed into the biopsy cavity. Follow up 2 view
mammogram was performed and dictated separately.
IMPRESSION: Ultrasound guided biopsy of a left breast mass. No apparent
complications.

## 2019-01-18 IMAGING — MG MM CLIP PLACEMENT
1 series · 1 of 1 positions shown · non-contrast
Comparison: Previous exam(s).

CLINICAL DATA: Left breast mass.  Evaluate biopsy clip.

EXAM:
DIAGNOSTIC LEFT MAMMOGRAM POST ULTRASOUND BIOPSY

[L ML]
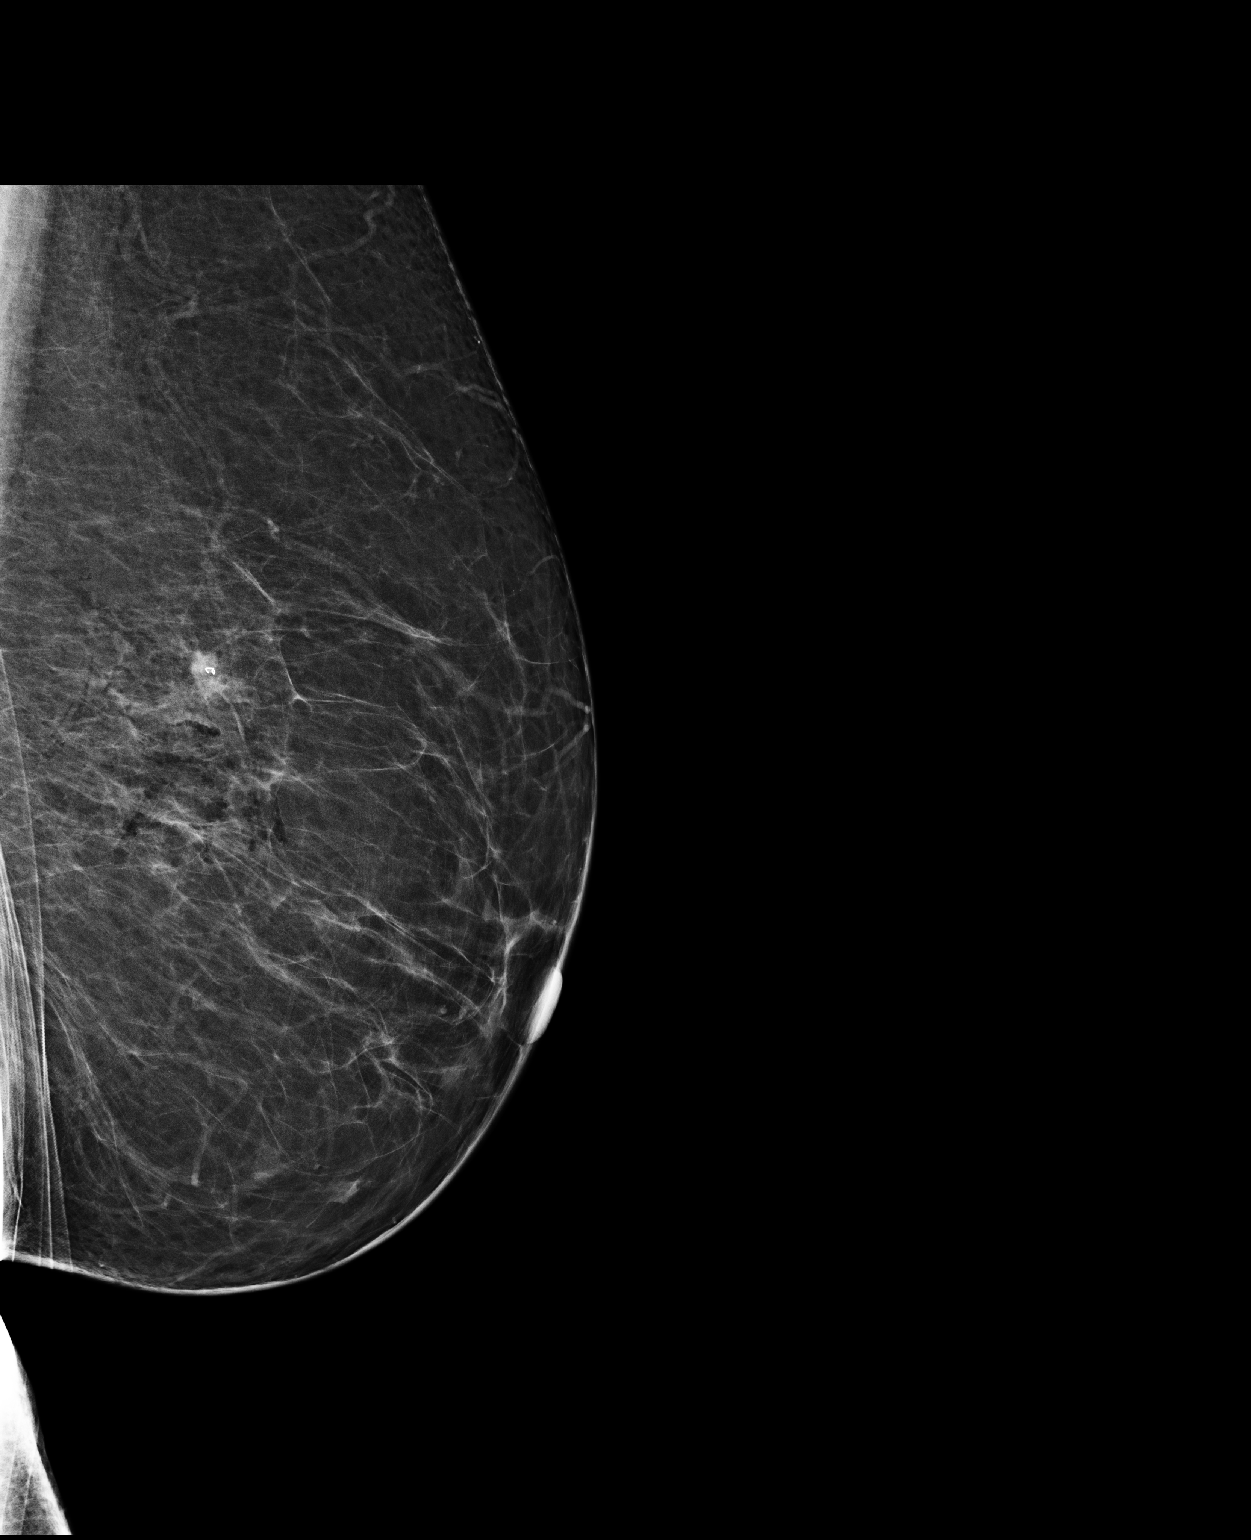

[1 of 1 positions shown; findings below may reference images not displayed]

FINDINGS: Mammographic images were obtained following ultrasound guided biopsy
of a left breast mass. The heart shaped biopsy clip is at the site
of the biopsied mass.
IMPRESSION: Appropriate clip placement as above.

Final Assessment: Post Procedure Mammograms for Marker Placement

## 2019-02-09 MED FILL — LISINOPRIL 10 MG TABS: 10 | 90 days supply | Qty: 90 | Fill #1

## 2019-02-09 MED FILL — ALPRAZolam 0.25 MG TABS: 0.25 | 30 days supply | Qty: 30 | Fill #0

## 2019-03-12 MED FILL — FLUARIX QUADRIVALENT 0.5 ML: 0.5 | 1 days supply | Qty: 1 | Fill #0

## 2019-03-17 ENCOUNTER — Telehealth: Payer: Self-pay | Admitting: Hematology and Oncology

## 2019-03-17 NOTE — Telephone Encounter (Signed)
VG PAL 10/20. Followed moved from 10/20 to 11/3. Left message for patient. Schedule mailed. Patient also mychart active.

## 2019-03-22 ENCOUNTER — Ambulatory Visit: Payer: 59 | Admitting: Hematology and Oncology

## 2019-03-22 MED FILL — LETROZOLE 2.5 MG TABS: 2.5 | 90 days supply | Qty: 90 | Fill #0

## 2019-03-23 IMAGING — CT CT ABD-PELV W/ CM
2 of 5 series · 16 of 46 positions shown, 18 images · IV contrast (APPLIED)
Comparison: None.

CLINICAL DATA: Right adnexal cystic lesion on recent outside
ultrasound. Currently undergoing radiation therapy for left breast
carcinoma.

EXAM:
CT ABDOMEN AND PELVIS WITH CONTRAST
TECHNIQUE: Multidetector CT imaging of the abdomen and pelvis was performed
using the standard protocol following bolus administration of
intravenous contrast.
CONTRAST:  100mL [id number] IOPAMIDOL ([id number]) INJECTION 61%

[Series 2: axial st · axial · 0.88mm/px · z∈[-416,-36]mm · 13 of 88 slices shown, 15 images]
[im 6/88  soft-tissue]
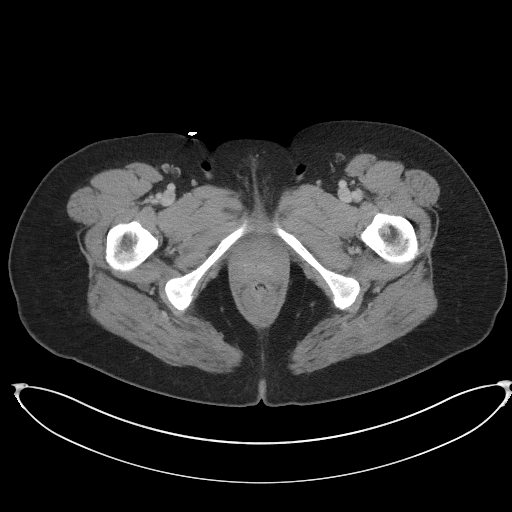
[im 6/88  bone]
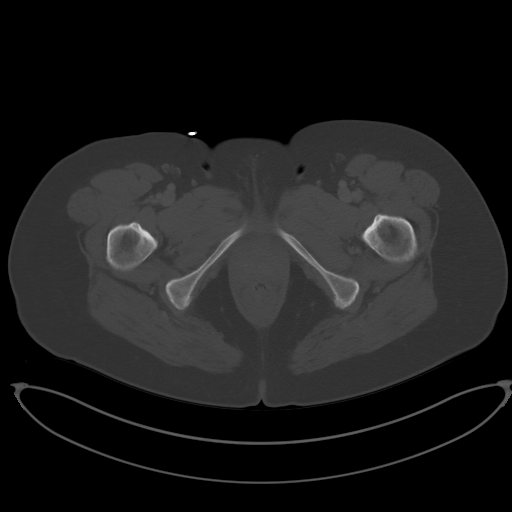
[im 11/88  soft-tissue]
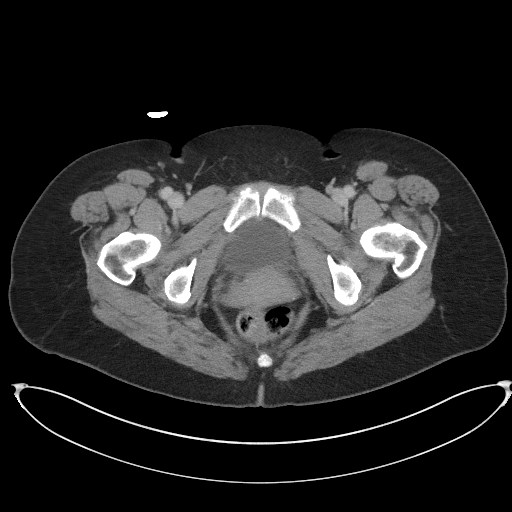
[im 17/88  soft-tissue]
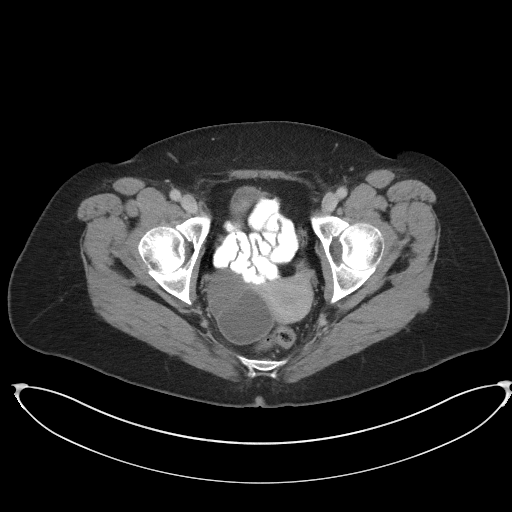
[im 28/88  soft-tissue]
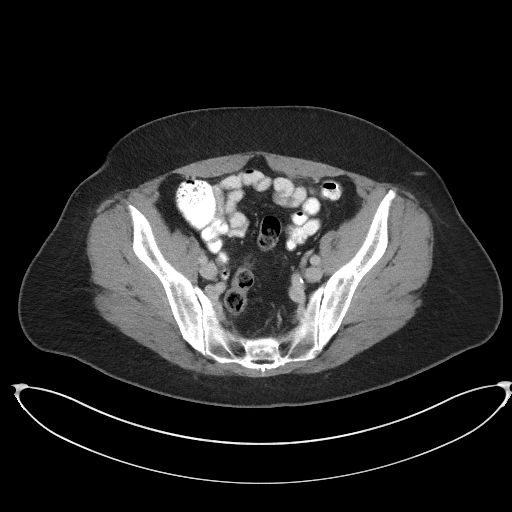
[im 33/88  soft-tissue]
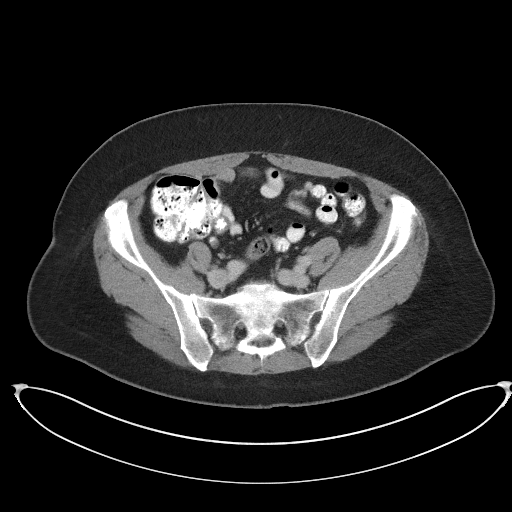
[im 39/88  soft-tissue]
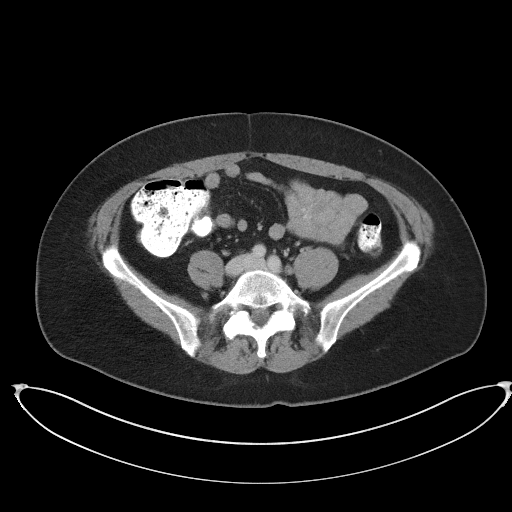
[im 44/88  soft-tissue]
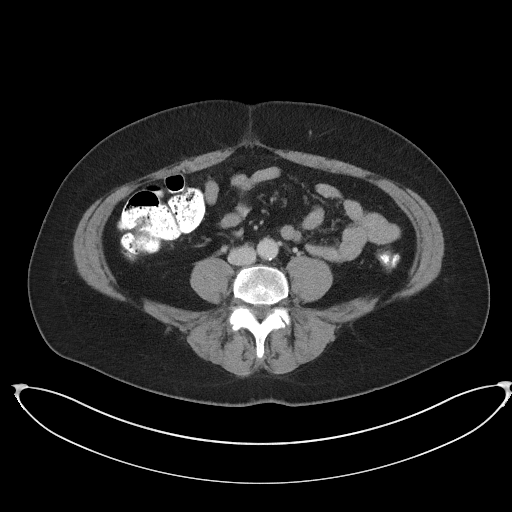
[im 49/88  soft-tissue]
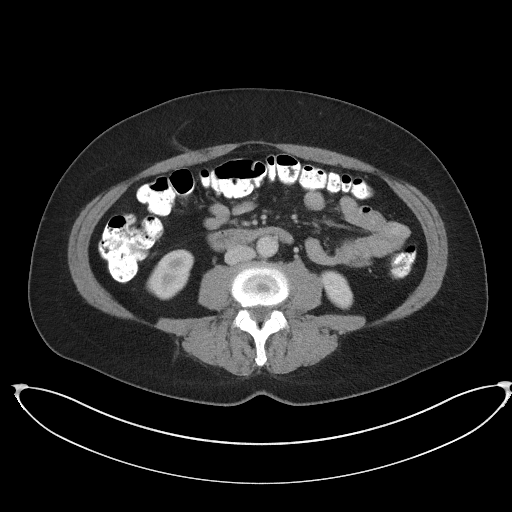
[im 55/88  soft-tissue]
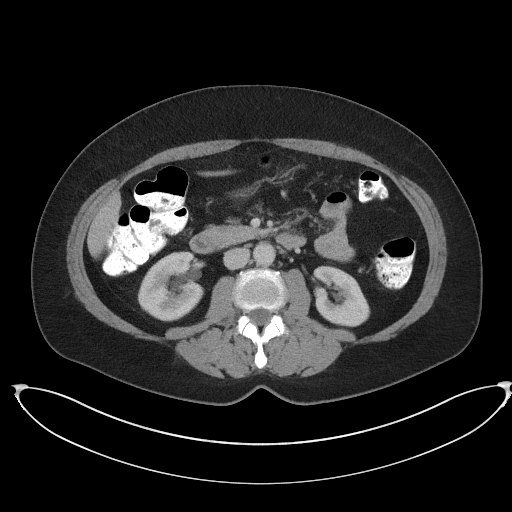
[im 55/88  bone]
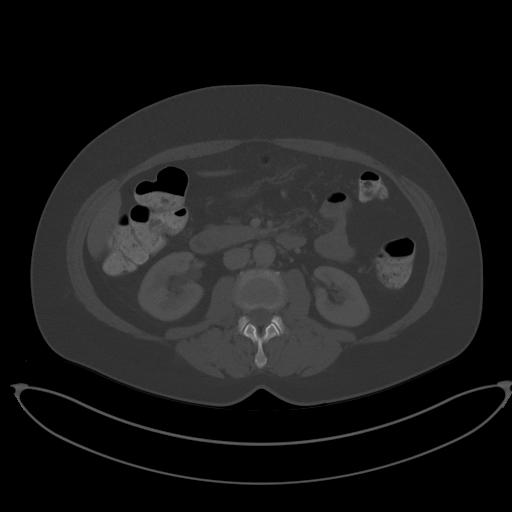
[im 60/88  soft-tissue]
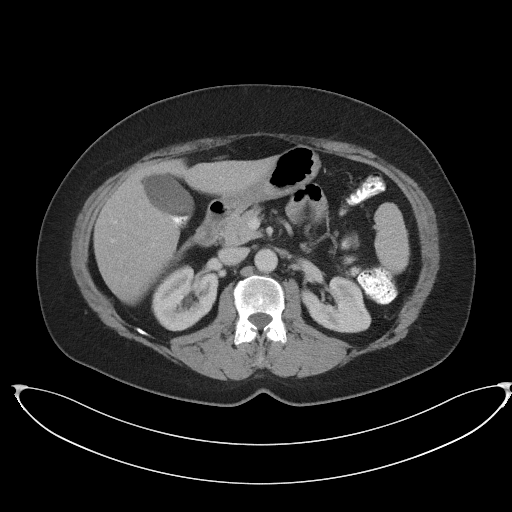
[im 71/88  soft-tissue]
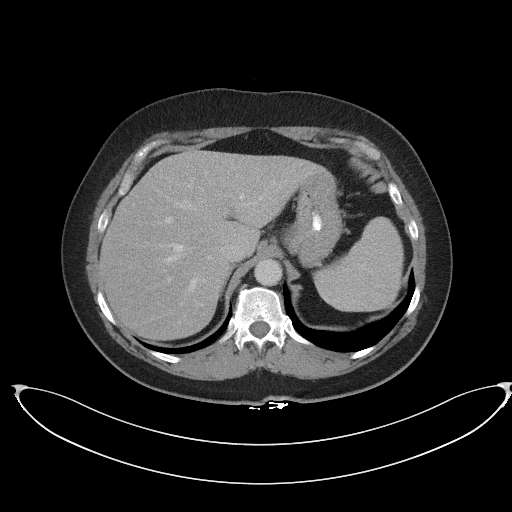
[im 77/88  soft-tissue]
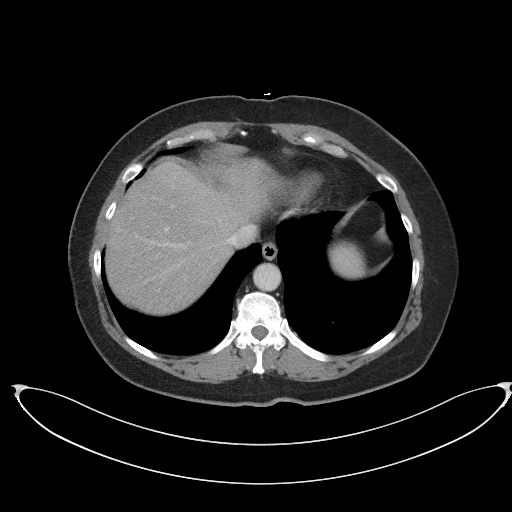
[im 82/88  soft-tissue]
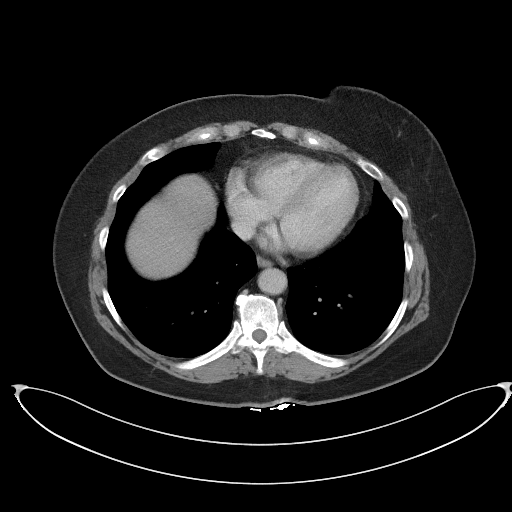

[Series 6: coronal st · coronal · 0.78mm/px · 3 of 94 slices shown]
[im 32/94  soft-tissue]
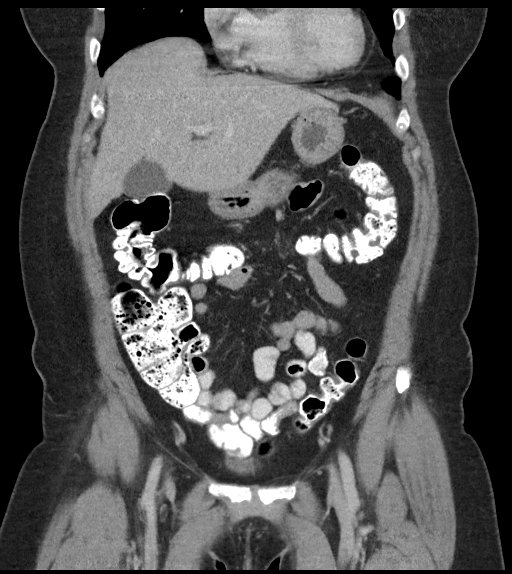
[im 42/94  soft-tissue]
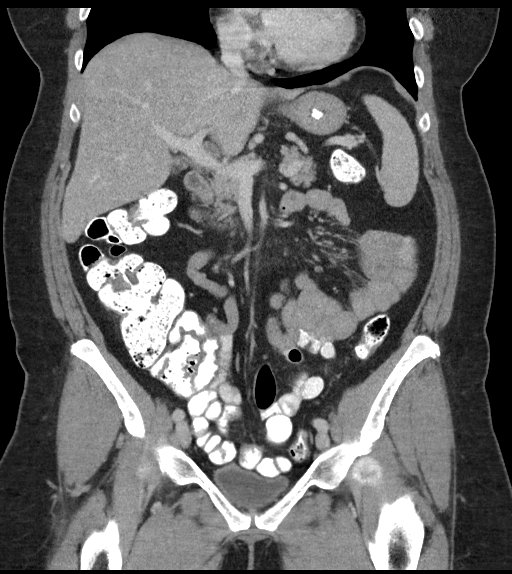
[im 52/94  soft-tissue]
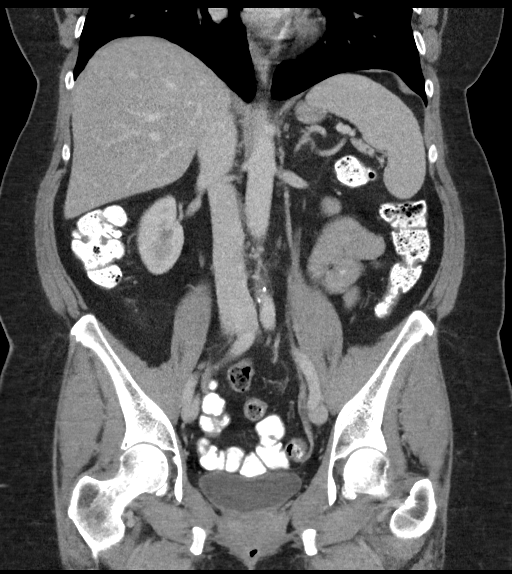

[16 of 46 positions shown; findings below may reference images not displayed]

FINDINGS: Lower Chest: No acute findings.

Hepatobiliary: No hepatic masses identified. Gallbladder is
unremarkable.

Pancreas:  No mass or inflammatory changes.

Spleen: Within normal limits in size and appearance.

Adrenals/Urinary Tract: No masses identified. 2 mm calculus seen in
interpolar region of right kidney. No evidence of ureteral calculi
or hydronephrosis. Unremarkable unopacified urinary bladder.

Stomach/Bowel: No evidence of obstruction, inflammatory process or
abnormal fluid collections. Normal appendix visualized.

Vascular/Lymphatic: No pathologically enlarged lymph nodes. No
abdominal aortic aneurysm.

Reproductive: Normal appearance of uterus. A complex cystic lesion
is seen in the right adnexa which measures 7.5 x 4.5 cm. This
contains several thin internal septations and a focus of mural
calcification, however no thickened septations or solid mural
nodules identified. This has probably benign characteristics, and is
suspicious for cystic ovarian neoplasm although endometrioma is also
in the differential diagnosis.

Other: No evidence of peritoneal thickening, nodularity, or ascites.

Musculoskeletal:  No suspicious bone lesions identified.
IMPRESSION: 7.5 cm complex cystic lesion in right adnexa, which has
indeterminate but probably benign characteristics. This is
suspicious for cystic ovarian neoplasm, with differential diagnosis
also including endometrioma. Recommend correlation with tumor
markers and consideration of surgical evaluation.

No evidence of metastatic disease.

Tiny nonobstructing right renal calculus.

## 2019-03-24 ENCOUNTER — Telehealth: Payer: Self-pay | Admitting: Hematology and Oncology

## 2019-03-24 NOTE — Telephone Encounter (Signed)
Returned patient's phone call regarding rescheduling an appointment, left a voicemail. 

## 2019-03-25 ENCOUNTER — Telehealth: Payer: Self-pay | Admitting: Hematology and Oncology

## 2019-03-25 DIAGNOSIS — C50912 Malignant neoplasm of unspecified site of left female breast: Secondary | ICD-10-CM | POA: Diagnosis not present

## 2019-03-25 NOTE — Telephone Encounter (Signed)
Returned call re rescheduling 11/3 f/u. Confirmed new f/u 11/10 with patient.

## 2019-04-05 ENCOUNTER — Ambulatory Visit: Payer: 59 | Admitting: Hematology and Oncology

## 2019-04-11 NOTE — Progress Notes (Signed)
Patient Care Team: Lucille Passy, MD as PCP - General (Family Medicine) Nicholas Lose, MD as Consulting Physician (Hematology and Oncology) Delice Bison, Charlestine Massed, NP as Nurse Practitioner (Hematology and Oncology) [name], MD as Consulting Physician (General Surgery) Gery Pray, MD as Consulting Physician (Radiation Oncology)  DIAGNOSIS:    ICD-10-CM   1. Malignant neoplasm of upper-outer quadrant of left breast in female, estrogen receptor positive ([place])  C50.412    Z17.0     SUMMARY OF ONCOLOGIC HISTORY: Oncology History  Malignant neoplasm of upper-outer quadrant of left breast in female, estrogen receptor positive ([place])  08/27/2017 Initial Diagnosis   Screening mammogram left breast showed possible asymmetry.  Targeted ultrasound revealed a 5 x 4 x 7 mm irregular hypoechoic mass 2 o'clock position 5 cm from nipple, no lymphadenopathy, biopsy showed IDC grade 2 with focal DCIS intermediate grade, HER-2 negative ratio 1.32, T1b N0 stage I a clinical stage AJCC 8   09/11/2017 Surgery   Left lumpectomy: 0.7 cm IDC 0/1 lymph node negative, margins negative, grade 2, ER 100%, PR 100%, HER-2 negative ratio 1.32, Ki-67 2%, T1 be N0 stage I a   09/11/2017 Oncotype testing   Oncotype DX recurrence score 20: 6% risk of recurrence with hormone therapy alone   10/20/2017 - 11/18/2017 Radiation Therapy   1. Left Breast / 40.05 Gy in 15 fractions 2. Left Breast Boost / 10 Gy in 5 fractions   10/2017 Genetic Testing   Done at [facility] with Dr. Helane Rima, negative testing of myriad panel.   12/19/2017 -  Anti-estrogen oral therapy   Letrozole daily   03/11/2018 Cancer Staging   Staging form: Breast, AJCC 8th Edition - Pathologic: Stage IA (pT1b, pN0, cM0, G2, ER+, PR+, HER2-, Oncotype DX score: 20) - Signed by Gardenia Phlegm, NP on 03/11/2018     CHIEF COMPLIANT: Follow-up of left breast cancer on letrozole therapy  INTERVAL HISTORY: Connie Clarke is a  63 y.o. with above-mentioned history of left breast cancer treated with lumpectomy, radiation, and is currently on antiestrogen therapy with letrozole. She presents to the clinic today for follow-up.  She is complaining of muscle cramps as a result of antiestrogen therapy.  She also has hot flashes that wake her up at 2:38 every morning. She was noted to have elevated cholesterol and was suggested that she take anticholesterol medication but she does not want to take it.  She also has difficulty falling asleep at night.  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  REVIEW OF SYSTEMS:   Constitutional: Denies fevers, chills or abnormal weight loss Eyes: Denies blurriness of vision Ears, nose, mouth, throat, and face: Denies mucositis or sore throat Respiratory: Denies cough, dyspnea or wheezes Cardiovascular: Denies palpitation, chest discomfort Gastrointestinal: Denies nausea, heartburn or change in bowel habits Skin: Denies abnormal skin rashes Lymphatics: Denies new lymphadenopathy or easy bruising Neurological: Denies numbness, tingling or new weaknesses Behavioral/Psych: Mood is stable, no new changes  Extremities: No lower extremity edema Breast: denies any pain or lumps or nodules in either breasts All other systems were reviewed with the patient and are negative.  I have reviewed the past medical history, past surgical history, social history and family history with the patient and they are unchanged from previous note.  ALLERGIES:  is allergic to chlorhexidine and steri-strip compound benzoin [benzoin compound].  MEDICATIONS:  Current Outpatient Medications  Medication Sig Dispense Refill  . acetaminophen (TYLENOL) 325 MG tablet Take 650 mg  by mouth every 6 (six) hours as needed (FOR PAIN.).    [name] ALPRAZolam (XANAX) 0.25 MG tablet Take 1 tablet (0.25 mg total) by mouth at bedtime as needed for anxiety. 30 tablet 3  . Cholecalciferol (CVS VIT D 5000 HIGH-POTENCY) 5000  units capsule Take 1 capsule (5,000 Units total) by mouth daily.    [name] ibuprofen (ADVIL,MOTRIN) 200 MG tablet Take 200 mg by mouth every 8 (eight) hours as needed (FOR PAIN.).    [name] letrozole (FEMARA) 2.5 MG tablet Take 1 tablet (2.5 mg total) by mouth daily. 90 tablet 2  . lisinopril (ZESTRIL) 10 MG tablet Take 1 tablet (10 mg total) by mouth daily. 90 tablet 3  . Multiple Vitamins-Minerals (MULTIVITAMIN WITH MINERALS) tablet Take 1 tablet by mouth daily.    . Turmeric Curcumin 500 MG CAPS Take 500 mg by mouth daily.     No current facility-administered medications for this visit.     PHYSICAL EXAMINATION: ECOG PERFORMANCE STATUS: 1 - Symptomatic but completely ambulatory  Vitals:   04/12/19 0855  BP: 140/63  Pulse: 95  Resp: 17  Temp: 98 F (36.7 C)  SpO2: 99%   Filed Weights   04/12/19 0855  Weight: 188 lb 4.8 oz (85.4 kg)    GENERAL: alert, no distress and comfortable SKIN: skin color, texture, turgor are normal, no rashes or significant lesions EYES: normal, Conjunctiva are pink and non-injected, sclera clear OROPHARYNX: no exudate, no erythema and lips, buccal mucosa, and tongue normal  NECK: supple, thyroid normal size, non-tender, without nodularity LYMPH: no palpable lymphadenopathy in the cervical, axillary or inguinal LUNGS: clear to auscultation and percussion with normal breathing effort HEART: regular rate & rhythm and no murmurs and no lower extremity edema ABDOMEN: abdomen soft, non-tender and normal bowel sounds MUSCULOSKELETAL: no cyanosis of digits and no clubbing  NEURO: alert & oriented x 3 with fluent speech, no focal motor/sensory deficits EXTREMITIES: No lower extremity edema BREAST: No palpable masses or nodules in either right or left breasts. No palpable axillary supraclavicular or infraclavicular adenopathy no breast tenderness or nipple discharge. (exam performed in the presence of a chaperone)  LABORATORY DATA:  I have reviewed the data as listed  CMP Latest Ref Rng & Units 11/02/2018 11/18/2017 06/22/2017  Glucose 70 - 99 mg/dL 98 95 106(H)  BUN 6 - 23 mg/dL 26(H) 20 22  Creatinine 0.40 - 1.20 mg/dL 0.79 0.72 0.82  Sodium 135 - 145 mEq/L 138 142 141  Potassium 3.5 - 5.1 mEq/L 4.5 4.2 5.0  Chloride 96 - 112 mEq/L 102 109 102  CO2 19 - 32 mEq/L '29 27 24  ' Calcium 8.4 - 10.5 mg/dL 9.6 9.1 9.8  Total Protein 6.0 - 8.3 g/dL 7.1 6.9 7.0  Total Bilirubin 0.2 - 1.2 mg/dL 0.5 0.5 0.3  Alkaline Phos 39 - 117 U/L 99 84 90  AST 0 - 37 U/L 23 35 29  ALT 0 - 35 U/L 43(H) 51 55(H)    Lab Results  Component Value Date   WBC 5.1 11/02/2018   HGB 14.5 11/02/2018   HCT 42.4 11/02/2018   MCV 88.2 11/02/2018   PLT 254.0 11/02/2018   NEUTROABS 3.2 11/02/2018    ASSESSMENT & PLAN:  Malignant neoplasm of upper-outer quadrant of left breast in female, estrogen receptor positive ([facility]) 09/11/2017:Left lumpectomy: 0.7 cm IDC 0/1 lymph node negative, margins negative, grade 2, ER 100%, PR 100%, HER-2 negative ratio 1.32, Ki-67 2%, T1 be N0 stage I a Oncotype DX score 20:  6%.risk of recurrence Adjuvant radiation therapy: 10/20/2017-11/18/2017  Current treatment: Adjuvant antiestrogen therapy with letrozole 2.5 mg daily started 12/19/2017 Letrozole toxicities:  1. Joint stiffness improves with activity 2.  Back pain: Much improved this month 3.  Hot flashes: Instructed to take letrozole at different time of the day 4.  Muscle cramps: Patient wants to take magnesium supplement daily. She takes turmeric as well.  For the high cholesterol she wants to take red rice yeast extract.  Bone density: June 2019: Dr. Rolin Barry office: T score -1.1  Breast cancer surveillance:  1. Mammogram 08/27/2018: Benign, breast density category B 2.  Breast exam 04/12/2019: Benign  Return to clinic in 1 year for follow-up    No orders of the defined types were placed in this encounter.  The patient has a good understanding of the overall plan. she agrees with it.  she will call with any problems that may develop before the next visit here.  Nicholas Lose, MD 04/12/2019  Julious Oka Dorshimer am acting as scribe for Dr. Nicholas Lose.  I have reviewed the above documentation for accuracy and completeness, and I agree with the above.

## 2019-04-12 ENCOUNTER — Inpatient Hospital Stay: Payer: 59 | Attending: Hematology and Oncology | Admitting: Hematology and Oncology

## 2019-04-12 ENCOUNTER — Other Ambulatory Visit: Payer: Self-pay

## 2019-04-12 DIAGNOSIS — R252 Cramp and spasm: Secondary | ICD-10-CM | POA: Insufficient documentation

## 2019-04-12 DIAGNOSIS — Z170 Estrogen receptor positive status [ER+]: Secondary | ICD-10-CM | POA: Diagnosis not present

## 2019-04-12 DIAGNOSIS — Z79811 Long term (current) use of aromatase inhibitors: Secondary | ICD-10-CM | POA: Insufficient documentation

## 2019-04-12 DIAGNOSIS — C50412 Malignant neoplasm of upper-outer quadrant of left female breast: Secondary | ICD-10-CM | POA: Diagnosis not present

## 2019-04-12 DIAGNOSIS — E7800 Pure hypercholesterolemia, unspecified: Secondary | ICD-10-CM | POA: Insufficient documentation

## 2019-04-12 DIAGNOSIS — M549 Dorsalgia, unspecified: Secondary | ICD-10-CM | POA: Insufficient documentation

## 2019-04-12 DIAGNOSIS — N951 Menopausal and female climacteric states: Secondary | ICD-10-CM | POA: Insufficient documentation

## 2019-04-12 NOTE — Assessment & Plan Note (Signed)
09/11/2017:Left lumpectomy: 0.7 cm IDC 0/1 lymph node negative, margins negative, grade 2, ER 100%, PR 100%, HER-2 negative ratio 1.32, Ki-67 2%, T1 be N0 stage I a Oncotype DX score 20: 6%.risk of recurrence Adjuvant radiation therapy: 10/20/2017-11/18/2017  Current treatment: Adjuvant antiestrogen therapy with letrozole 2.5 mg daily started 12/19/2017 Letrozole toxicities: Joint stiffness improves with activity  Bone density: June 2019: Dr. Graywall's office: T score -1.1  Breast cancer surveillance:  1. Mammogram 08/27/2018: Benign, breast density category B 2.  Breast exam 04/12/2019: Benign  Return to clinic in 1 year for follow-up 

## 2019-04-13 ENCOUNTER — Telehealth: Payer: Self-pay | Admitting: Hematology and Oncology

## 2019-04-13 NOTE — Telephone Encounter (Signed)
I left a message regarding schedule  

## 2019-04-24 DIAGNOSIS — R067 Sneezing: Secondary | ICD-10-CM | POA: Diagnosis not present

## 2019-04-24 DIAGNOSIS — J3089 Other allergic rhinitis: Secondary | ICD-10-CM | POA: Diagnosis not present

## 2019-04-24 DIAGNOSIS — R0989 Other specified symptoms and signs involving the circulatory and respiratory systems: Secondary | ICD-10-CM | POA: Diagnosis not present

## 2019-04-24 DIAGNOSIS — R0981 Nasal congestion: Secondary | ICD-10-CM | POA: Diagnosis not present

## 2019-04-24 DIAGNOSIS — Z20828 Contact with and (suspected) exposure to other viral communicable diseases: Secondary | ICD-10-CM | POA: Diagnosis not present

## 2019-05-16 MED FILL — LISINOPRIL 10 MG TABS: 10 | 90 days supply | Qty: 90 | Fill #2

## 2019-05-30 DIAGNOSIS — H40053 Ocular hypertension, bilateral: Secondary | ICD-10-CM | POA: Diagnosis not present

## 2019-05-30 DIAGNOSIS — H52223 Regular astigmatism, bilateral: Secondary | ICD-10-CM | POA: Diagnosis not present

## 2019-05-30 DIAGNOSIS — H5213 Myopia, bilateral: Secondary | ICD-10-CM | POA: Diagnosis not present

## 2019-05-30 DIAGNOSIS — H2513 Age-related nuclear cataract, bilateral: Secondary | ICD-10-CM | POA: Diagnosis not present

## 2019-05-30 DIAGNOSIS — H40013 Open angle with borderline findings, low risk, bilateral: Secondary | ICD-10-CM | POA: Diagnosis not present

## 2019-05-30 DIAGNOSIS — H524 Presbyopia: Secondary | ICD-10-CM | POA: Diagnosis not present

## 2019-06-24 MED FILL — LETROZOLE 2.5 MG TABS: 2.5 | 90 days supply | Qty: 90 | Fill #1

## 2019-07-25 DIAGNOSIS — H1131 Conjunctival hemorrhage, right eye: Secondary | ICD-10-CM | POA: Diagnosis not present

## 2019-07-26 ENCOUNTER — Other Ambulatory Visit: Payer: Self-pay | Admitting: Adult Health

## 2019-07-26 ENCOUNTER — Other Ambulatory Visit: Payer: Self-pay | Admitting: Hematology and Oncology

## 2019-07-26 DIAGNOSIS — Z853 Personal history of malignant neoplasm of breast: Secondary | ICD-10-CM

## 2019-08-16 MED FILL — LISINOPRIL 10 MG TABS: 10 | 90 days supply | Qty: 90 | Fill #3

## 2019-08-29 ENCOUNTER — Ambulatory Visit
Admission: RE | Admit: 2019-08-29 | Discharge: 2019-08-29 | Disposition: A | Discharge status: Home / Self Care | Payer: 59 | Source: Ambulatory Visit | Attending: Hematology and Oncology | Admitting: Hematology and Oncology

## 2019-08-29 ENCOUNTER — Other Ambulatory Visit: Payer: Self-pay

## 2019-08-29 DIAGNOSIS — Z853 Personal history of malignant neoplasm of breast: Secondary | ICD-10-CM

## 2019-08-29 DIAGNOSIS — R928 Other abnormal and inconclusive findings on diagnostic imaging of breast: Secondary | ICD-10-CM | POA: Diagnosis not present

## 2019-09-28 MED FILL — LETROZOLE 2.5 MG TABS: 2.5 | 90 days supply | Qty: 90 | Fill #0

## 2019-10-09 NOTE — Progress Notes (Signed)
Patient Care Team: Lucille Passy, MD as PCP - General (Family Medicine) Nicholas Lose, MD as Consulting Physician (Hematology and Oncology) Delice Bison, Charlestine Massed, NP as Nurse Practitioner (Hematology and Oncology) [name], MD as Consulting Physician (General Surgery) Gery Pray, MD as Consulting Physician (Radiation Oncology)  DIAGNOSIS:    ICD-10-CM   1. Malignant neoplasm of upper-outer quadrant of left breast in female, estrogen receptor positive ([place])  C50.412    Z17.0     SUMMARY OF ONCOLOGIC HISTORY: Oncology History  Malignant neoplasm of upper-outer quadrant of left breast in female, estrogen receptor positive ([place])  08/27/2017 Initial Diagnosis   Screening mammogram left breast showed possible asymmetry.  Targeted ultrasound revealed a 5 x 4 x 7 mm irregular hypoechoic mass 2 o'clock position 5 cm from nipple, no lymphadenopathy, biopsy showed IDC grade 2 with focal DCIS intermediate grade, HER-2 negative ratio 1.32, T1b N0 stage I a clinical stage AJCC 8   09/11/2017 Surgery   Left lumpectomy: 0.7 cm IDC 0/1 lymph node negative, margins negative, grade 2, ER 100%, PR 100%, HER-2 negative ratio 1.32, Ki-67 2%, T1 be N0 stage I a   09/11/2017 Oncotype testing   Oncotype DX recurrence score 20: 6% risk of recurrence with hormone therapy alone   10/20/2017 - 11/18/2017 Radiation Therapy   1. Left Breast / 40.05 Gy in 15 fractions 2. Left Breast Boost / 10 Gy in 5 fractions   10/2017 Genetic Testing   Done at [facility] with Dr. Helane Rima, negative testing of myriad panel.   12/19/2017 -  Anti-estrogen oral therapy   Letrozole daily   03/11/2018 Cancer Staging   Staging form: Breast, AJCC 8th Edition - Pathologic: Stage IA (pT1b, pN0, cM0, G2, ER+, PR+, HER2-, Oncotype DX score: 20) - Signed by Gardenia Phlegm, NP on 03/11/2018     CHIEF COMPLIANT: Follow-up of left breast cancer on letrozole therapy  INTERVAL HISTORY: Connie Clarke is a  64 y.o. with above-mentioned history of left breast cancer treated with lumpectomy, radiation, and is currently on antiestrogen therapy with letrozole. Mammogram on 08/29/19 showed no evidence of malignancy bilaterally. She presents to the clinic today for follow-up.   ALLERGIES:  is allergic to chlorhexidine and steri-strip compound benzoin [benzoin compound].  MEDICATIONS:  Current Outpatient Medications  Medication Sig Dispense Refill  . acetaminophen (TYLENOL) 325 MG tablet Take 650 mg by mouth every 6 (six) hours as needed (FOR PAIN.).    [name] ALPRAZolam (XANAX) 0.25 MG tablet Take 1 tablet (0.25 mg total) by mouth at bedtime as needed for anxiety. 30 tablet 3  . Cholecalciferol (CVS VIT D 5000 HIGH-POTENCY) 5000 units capsule Take 1 capsule (5,000 Units total) by mouth daily.    [name] ibuprofen (ADVIL,MOTRIN) 200 MG tablet Take 200 mg by mouth every 8 (eight) hours as needed (FOR PAIN.).    [name] letrozole (FEMARA) 2.5 MG tablet Take 1 tablet (2.5 mg total) by mouth daily. 90 tablet 3  . lisinopril (ZESTRIL) 10 MG tablet Take 1 tablet (10 mg total) by mouth daily. 90 tablet 3  . Multiple Vitamins-Minerals (MULTIVITAMIN WITH MINERALS) tablet Take 1 tablet by mouth daily.    . Turmeric Curcumin 500 MG CAPS Take 500 mg by mouth daily.     No current facility-administered medications for this visit.    PHYSICAL EXAMINATION: ECOG PERFORMANCE STATUS: 1 - Symptomatic but completely ambulatory  Vitals:   10/10/19 0846  BP: (!) 144/73  Pulse: 92  Resp: 18  Temp:  98.2 F (36.8 C)  SpO2: 100%   Filed Weights   10/10/19 0846  Weight: 187 lb 14.4 oz (85.2 kg)    BREAST: No palpable masses or nodules in either right or left breasts. No palpable axillary supraclavicular or infraclavicular adenopathy no breast tenderness or nipple discharge. (exam performed in the presence of a chaperone)  LABORATORY DATA:  I have reviewed the data as listed CMP Latest Ref Rng & Units 11/02/2018 11/18/2017 06/22/2017   Glucose 70 - 99 mg/dL 98 95 106(H)  BUN 6 - 23 mg/dL 26(H) 20 22  Creatinine 0.40 - 1.20 mg/dL 0.79 0.72 0.82  Sodium 135 - 145 mEq/L 138 142 141  Potassium 3.5 - 5.1 mEq/L 4.5 4.2 5.0  Chloride 96 - 112 mEq/L 102 109 102  CO2 19 - 32 mEq/L '29 27 24  ' Calcium 8.4 - 10.5 mg/dL 9.6 9.1 9.8  Total Protein 6.0 - 8.3 g/dL 7.1 6.9 7.0  Total Bilirubin 0.2 - 1.2 mg/dL 0.5 0.5 0.3  Alkaline Phos 39 - 117 U/L 99 84 90  AST 0 - 37 U/L 23 35 29  ALT 0 - 35 U/L 43(H) 51 55(H)    Lab Results  Component Value Date   WBC 5.1 11/02/2018   HGB 14.5 11/02/2018   HCT 42.4 11/02/2018   MCV 88.2 11/02/2018   PLT 254.0 11/02/2018   NEUTROABS 3.2 11/02/2018    ASSESSMENT & PLAN:  Malignant neoplasm of upper-outer quadrant of left breast in female, estrogen receptor positive ([facility]) 09/11/2017:Left lumpectomy: 0.7 cm IDC 0/1 lymph node negative, margins negative, grade 2, ER 100%, PR 100%, HER-2 negative ratio 1.32, Ki-67 2%, T1 be N0 stage I a Oncotype DX score 20: 6%.risk of recurrence Adjuvant radiation therapy: 10/20/2017-11/18/2017  Current treatment: Adjuvant antiestrogen therapy with letrozole 2.5 mg daily started 12/19/2017 Letrozole toxicities: 1.  Muscle cramps are manageable. 2.  Mild hot flashes  Difficulty with sleeping: This is her main complaint today. For the high cholesterol she does not want to take cholesterol medication.  She will try red rice yeast extract.  She is seeing a new primary care physician to discuss this.  Bone density: June 2019: Dr. Rolin Barry office: T score -1.1  Breast cancer surveillance:  Mammogram 08/29/2019: Benign, breast density category A   Return to clinic in 1 year for follow-up    No orders of the defined types were placed in this encounter.  The patient has a good understanding of the overall plan. she agrees with it. she will call with any problems that may develop before the next visit here.  Total time spent: 20 mins including face to  face time and time spent for planning, charting and coordination of care  Nicholas Lose, MD 10/10/2019  I, Cloyde Reams Dorshimer, am acting as scribe for Dr. Nicholas Lose.  I have reviewed the above documentation for accuracy and completeness, and I agree with the above.

## 2019-10-10 ENCOUNTER — Other Ambulatory Visit: Payer: Self-pay | Admitting: Hematology and Oncology

## 2019-10-10 ENCOUNTER — Other Ambulatory Visit: Payer: Self-pay

## 2019-10-10 ENCOUNTER — Inpatient Hospital Stay: Payer: 59 | Attending: Hematology and Oncology | Admitting: Hematology and Oncology

## 2019-10-10 DIAGNOSIS — Z79811 Long term (current) use of aromatase inhibitors: Secondary | ICD-10-CM | POA: Diagnosis not present

## 2019-10-10 DIAGNOSIS — Z170 Estrogen receptor positive status [ER+]: Secondary | ICD-10-CM | POA: Insufficient documentation

## 2019-10-10 DIAGNOSIS — C50412 Malignant neoplasm of upper-outer quadrant of left female breast: Secondary | ICD-10-CM | POA: Insufficient documentation

## 2019-10-10 DIAGNOSIS — N951 Menopausal and female climacteric states: Secondary | ICD-10-CM | POA: Diagnosis not present

## 2019-10-10 MED ORDER — LETROZOLE 2.5 MG PO TABS: 2.5000 mg | ORAL_TABLET | Freq: Every day | ORAL | 3 refills | Status: DC

## 2019-10-10 NOTE — Assessment & Plan Note (Signed)
09/11/2017:Left lumpectomy: 0.7 cm IDC 0/1 lymph node negative, margins negative, grade 2, ER 100%, PR 100%, HER-2 negative ratio 1.32, Ki-67 2%, T1 be N0 stage I a Oncotype DX score 20: 6%.risk of recurrence Adjuvant radiation therapy: 10/20/2017-11/18/2017  Current treatment: Adjuvant antiestrogen therapy with letrozole 2.5 mg daily started 12/19/2017 Letrozole toxicities: 1. Joint stiffness improves with activity 2.  Back pain: Much improved this month 3.  Hot flashes: Instructed to take letrozole at different time of the day 4.  Muscle cramps: Patient wants to take magnesium supplement daily. She takes turmeric as well.  For the high cholesterol she wants to take red rice yeast extract.  Bone density: June 2019: Dr. Rolin Barry office: T score -1.1  Breast cancer surveillance:  1. Mammogram 08/29/2019: Benign, breast density category A 2.  Breast exam 10/10/2019: Benign  Return to clinic in 1 year for follow-up

## 2019-10-11 ENCOUNTER — Telehealth: Payer: Self-pay | Admitting: Hematology and Oncology

## 2019-10-11 NOTE — Telephone Encounter (Signed)
Scheduled per 05/10 los, patient has been called and voicemail was left.

## 2019-10-26 DIAGNOSIS — I10 Essential (primary) hypertension: Secondary | ICD-10-CM | POA: Diagnosis not present

## 2019-10-26 DIAGNOSIS — E7800 Pure hypercholesterolemia, unspecified: Secondary | ICD-10-CM | POA: Diagnosis not present

## 2019-10-26 DIAGNOSIS — E669 Obesity, unspecified: Secondary | ICD-10-CM | POA: Diagnosis not present

## 2019-10-26 DIAGNOSIS — Z0000 Encounter for general adult medical examination without abnormal findings: Secondary | ICD-10-CM | POA: Diagnosis not present

## 2019-10-26 DIAGNOSIS — E559 Vitamin D deficiency, unspecified: Secondary | ICD-10-CM | POA: Diagnosis not present

## 2019-11-15 ENCOUNTER — Other Ambulatory Visit (HOSPITAL_COMMUNITY): Payer: Self-pay | Admitting: Family Medicine

## 2019-11-15 MED FILL — ROSUVASTATIN CALCIUM 10 MG: 10 | 90 days supply | Qty: 90 | Fill #0

## 2019-11-17 ENCOUNTER — Other Ambulatory Visit (HOSPITAL_COMMUNITY): Payer: Self-pay | Admitting: Family Medicine

## 2019-11-17 MED FILL — LISINOPRIL 10 MG TABS: 10 | 90 days supply | Qty: 90 | Fill #0

## 2019-11-30 ENCOUNTER — Encounter: Payer: Self-pay | Admitting: Hematology and Oncology

## 2019-11-30 DIAGNOSIS — Z01419 Encounter for gynecological examination (general) (routine) without abnormal findings: Secondary | ICD-10-CM | POA: Diagnosis not present

## 2019-11-30 DIAGNOSIS — Z13820 Encounter for screening for osteoporosis: Secondary | ICD-10-CM | POA: Diagnosis not present

## 2019-11-30 DIAGNOSIS — E7800 Pure hypercholesterolemia, unspecified: Secondary | ICD-10-CM | POA: Insufficient documentation

## 2019-11-30 DIAGNOSIS — Z6832 Body mass index (BMI) 32.0-32.9, adult: Secondary | ICD-10-CM | POA: Diagnosis not present

## 2019-12-01 ENCOUNTER — Telehealth: Payer: Self-pay | Admitting: General Practice

## 2019-12-01 NOTE — Telephone Encounter (Signed)
Former Dr. Deborra Medina patient: Left message for patient to give the office a call back if interested in establishing care with one of our providers here at our office.

## 2019-12-09 ENCOUNTER — Other Ambulatory Visit (HOSPITAL_COMMUNITY): Payer: Self-pay | Admitting: Internal Medicine

## 2019-12-27 ENCOUNTER — Encounter (INDEPENDENT_AMBULATORY_CARE_PROVIDER_SITE_OTHER): Payer: Self-pay | Admitting: Internal Medicine

## 2019-12-27 ENCOUNTER — Ambulatory Visit (INDEPENDENT_AMBULATORY_CARE_PROVIDER_SITE_OTHER): Payer: 59 | Admitting: Internal Medicine

## 2019-12-27 ENCOUNTER — Other Ambulatory Visit: Payer: Self-pay

## 2019-12-27 VITALS — BP 130/70 | HR 92 | Temp 97.3°F | Ht 64.0 in | Wt 186.8 lb

## 2019-12-27 DIAGNOSIS — E282 Polycystic ovarian syndrome: Secondary | ICD-10-CM

## 2019-12-27 DIAGNOSIS — E8881 Metabolic syndrome: Secondary | ICD-10-CM | POA: Diagnosis not present

## 2019-12-27 DIAGNOSIS — E559 Vitamin D deficiency, unspecified: Secondary | ICD-10-CM | POA: Diagnosis not present

## 2019-12-27 DIAGNOSIS — E782 Mixed hyperlipidemia: Secondary | ICD-10-CM

## 2019-12-27 DIAGNOSIS — R5383 Other fatigue: Secondary | ICD-10-CM

## 2019-12-27 DIAGNOSIS — I10 Essential (primary) hypertension: Secondary | ICD-10-CM

## 2019-12-27 DIAGNOSIS — F520 Hypoactive sexual desire disorder: Secondary | ICD-10-CM

## 2019-12-27 DIAGNOSIS — R5381 Other malaise: Secondary | ICD-10-CM | POA: Diagnosis not present

## 2019-12-27 NOTE — Progress Notes (Signed)
Metrics: Intervention Frequency ACO  Documented Smoking Status Yearly  Screened one or more times in 24 months  Cessation Counseling or  Active cessation medication Past 24 months  Past 24 months   Guideline developer: UpToDate (See UpToDate for funding source) Date Released: 2014       Wellness Office Visit  Subjective:  Patient ID: Connie Clarke, female    DOB: 1956/03/15  Age: 64 y.o. MRN: [id number]  CC: This delightful 64 year old lady comes to our practice to establish care. HPI  She has a history of hyperlipidemia and previous physician recommended statin therapy which she really does not want to do. She also has hypertension. She has a history of left breast cancer for for which she had lumpectomy looks like and now is on Femara. She describes fatigue, decreased libido, history of ovarian cystic disease. Past Medical History:  Diagnosis Date  . Anxiety   . Breast cancer ([place]) 2019   Left Breast Cancer  . Breast cancer, left ([place])   . Coughing    WHITEISH SPUTUM ONSET 11-17-17 PER PATIENT   . Hyperlipidemia   . Hypertension   . Infertility, female   . Joint pain   . Nasal congestion   . Ovarian cyst   . Palpitations    REPORTS NO LONGER OCCURS   . Personal history of radiation therapy 2019   Left Breast Cancer  . Vitamin D deficiency    Past Surgical History:  Procedure Laterality Date  . BREAST LUMPECTOMY Left 09/11/2017  . BREAST LUMPECTOMY WITH RADIOACTIVE SEED AND SENTINEL LYMPH NODE BIOPSY Left 09/11/2017   Procedure: LEFT BREAST LUMPECTOMY WITH RADIOACTIVE SEED AND SENTINEL LYMPH NODE BIOPSY;  Surgeon: [name], MD;  Location: [facility];  Service: General;  Laterality: Left;  . DILATION AND CURETTAGE OF UTERUS  1990  . ROBOTIC ASSISTED TOTAL HYSTERECTOMY WITH BILATERAL SALPINGO OOPHERECTOMY Bilateral 11/23/2017   Procedure: XI ROBOTIC ASSISTED TOTAL HYSTERECTOMY WITH BILATERAL SALPINGO OOPHORECTOMY,;  Surgeon: Everitt Amber, MD;   Location: [facility];  Service: Gynecology;  Laterality: Bilateral;     Family History  Problem Relation Age of Onset  . Stroke Mother   . Anxiety disorder Mother   . Obesity Mother   . Diabetes Father   . Hyperlipidemia Father   . Stroke Father   . Depression Father   . Obesity Father   . Melanoma Sister   . Prostate cancer Brother   . 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   Social History   Tobacco Use  . Smoking status: Former Smoker    Years: 3.00    Types: Cigarettes    Quit date: 1980    Years since quitting: 41.5  . Smokeless tobacco: Never Used  Substance Use Topics  . Alcohol use: Yes    Comment: OCC    Current Meds  Medication Sig  . acetaminophen (TYLENOL) 325 MG tablet Take 650 mg by mouth every 6 (six) hours as needed (FOR PAIN.).  [name] ALPRAZolam (XANAX) 0.25 MG tablet Take 1 tablet (0.25 mg total) by mouth at bedtime as needed for anxiety.  . Cholecalciferol (CVS VIT D 5000 HIGH-POTENCY) 5000 units capsule Take 1 capsule (5,000 Units total) by mouth daily.  . Coenzyme Q10 (COQ10) 400 MG CAPS Take 400 mg by mouth daily.  [name] ibuprofen (ADVIL,MOTRIN) 200 MG tablet Take 200 mg by mouth every 8 (eight)  hours as needed (FOR PAIN.).  [name] letrozole (FEMARA) 2.5 MG tablet Take 1 tablet (2.5 mg total) by mouth daily.  [name] lisinopril (ZESTRIL) 10 MG tablet Take 1 tablet (10 mg total) by mouth daily.  . Magnesium 400 MG CAPS Take 400 mg by mouth daily.  . Omega-3 Fatty Acids (FISH OIL) 1000 MG CAPS Take 1,000 mg by mouth daily.  . rosuvastatin (CRESTOR) 10 MG tablet Take 10 mg by mouth daily.  . Turmeric Curcumin 500 MG CAPS Take 500 mg by mouth daily.       Depression screen [facility] 2/9 11/01/2018 12/24/2017 09/21/2017 05/21/2016  Decreased Interest 0 0 0 2  Down, Depressed, Hopeless 0 0 0 3  PHQ - 2 Score 0 0 0 5  Altered sleeping - - - 1  Tired, decreased energy - -  - 2  Change in appetite - - - 3  Feeling bad or failure about yourself  - - - 3  Trouble concentrating - - - 1  Moving slowly or fidgety/restless - - - 1  Suicidal thoughts - - - 0  PHQ-9 Score - - - 16     Objective:   Today's Vitals: BP (!) 130/70 (BP Location: Right Arm, Patient Position: Sitting, Cuff Size: Normal)   Pulse 92   Temp (!) 97.3 F (36.3 C) (Temporal)   Ht 5\' 4"  (1.626 m)   Wt 186 lb 12.8 oz (84.7 kg)   LMP 06/02/2005 (Approximate)   SpO2 96%   BMI 32.06 kg/m  Vitals with BMI 12/27/2019 10/10/2019 04/12/2019  Height 5\' 4"  5\' 4"  5\' 4"   Weight 186 lbs 13 oz 187 lbs 14 oz 188 lbs 5 oz  BMI 32.05 66.44 03.47  Systolic 425 956 387  Diastolic 70 73 63  Pulse 92 92 95     Physical Exam  She is obese.  Blood pressure is in a good range.  I did a body composition scan in the office and she is 46% body fat.  She is only about 41% total body water.  Muscle mass is average.     Assessment   1. Essential hypertension   2. Insulin resistance   3. Vitamin D deficiency   4. Mixed hyperlipidemia   5. Hypoactive sexual desire disorder   6. PCOS (polycystic ovarian syndrome)   7. Malaise and fatigue       Tests ordered Orders Placed This Encounter  Procedures  . Cardio IQ Insulin Resistance Panel with Score  . Cardio IQ Adv Lipid and Inflamm Pnl  . T3, free  . T4  . TSH     Plan: 1. Blood work is ordered. 2. She will continue with all medications. 3. We discussed the concept of intermittent fasting combined with a plant-based diet.  Thankfully she was well acquainted with both of these concepts and she will now try to do this on a consistent basis. 4. For hyperlipidemia, I have told her to discontinue statin therapy because I think we can improve her numbers with diet and exercise alone. 5. Follow-up in about a month's time when we will discuss all the results and further recommendations   No orders of the defined types were placed in this  encounter.   Doree Albee, MD

## 2019-12-27 NOTE — Patient Instructions (Signed)
[facility] Dietary Recommendations for Weight Loss What to Avoid . Avoid added sugars o Often added sugar can be found in processed foods such as many condiments, dry cereals, cakes, cookies, chips, crisps, crackers, candies, sweetened drinks, etc.  o Read labels and AVOID/DECREASE use of foods with the following in their ingredient list: Sugar, fructose, high fructose corn syrup, sucrose, glucose, maltose, dextrose, molasses, cane sugar, [place] sugar, any type of syrup, agave nectar, etc.   . Avoid snacking in between meals . Avoid foods made with flour o If you are going to eat food made with flour, choose those made with whole-grains; and, minimize your consumption as much as is tolerable . Avoid processed foods o These foods are generally stocked in the middle of the grocery store. Focus on shopping on the perimeter of the grocery.  . Avoid Meat  o We recommend following a plant-based diet at [facility]. Thus, we recommend avoiding meat as a general rule. Consider eating beans, legumes, eggs, and/or dairy products for regular protein sources o If you plan on eating meat limit to 4 ounces of meat at a time and choose lean options such as Fish, chicken, turkey. Avoid red meat intake such as pork and/or steak What to Include . Vegetables o GREEN LEAFY VEGETABLES: Kale, spinach, mustard greens, collard greens, cabbage, broccoli, etc. o OTHER: Asparagus, cauliflower, eggplant, carrots, peas, Brussel sprouts, tomatoes, bell peppers, zucchini, beets, cucumbers, etc. . Grains, seeds, and legumes o Beans: kidney beans, black eyed peas, garbanzo beans, black beans, pinto beans, etc. o Whole, unrefined grains: Shima rice, barley, bulgur, oatmeal, etc. . Healthy fats  o Avoid highly processed fats such as vegetable oil o Examples of healthy fats: avocado, olives, virgin olive oil, dark chocolate (?72% Cocoa), nuts (peanuts, almonds, walnuts, cashews, pecans, etc.) . None to Low  Intake of Animal Sources of Protein o Meat sources: chicken, turkey, salmon, tuna. Limit to 4 ounces of meat at one time. o Consider limiting dairy sources, but when choosing dairy focus on: PLAIN Greek yogurt, cottage cheese, high-protein milk . Fruit o Choose berries  When to Eat . Intermittent Fasting: o Choosing not to eat for a specific time period, but DO FOCUS ON HYDRATION when fasting o Multiple Techniques: - Time Restricted Eating: eat 3 meals in a day, each meal lasting no more than 60 minutes, no snacks between meals - 16-18 hour fast: fast for 16 to 18 hours up to 7 days a week. Often suggested to start with 2-3 nonconsecutive days per week.  . Remember the time you sleep is counted as fasting.  . Examples of eating schedule: Fast from 7:00pm-11:00am. Eat between 11:00am-7:00pm.  - 24-hour fast: fast for 24 hours up to every other day. Often suggested to start with 1 day per week . Remember the time you sleep is counted as fasting . Examples of eating schedule:  o Eating day: eat 2-3 meals on your eating day. If doing 2 meals, each meal should last no more than 90 minutes. If doing 3 meals, each meal should last no more than 60 minutes. Finish last meal by 7:00pm. o Fasting day: Fast until 7:00pm.  o IF YOU FEEL UNWELL FOR ANY REASON/IN ANY WAY WHEN FASTING, STOP FASTING BY EATING A NUTRITIOUS SNACK OR LIGHT MEAL o ALWAYS FOCUS ON HYDRATION DURING FASTS - Acceptable Hydration sources: water, broths, tea/coffee (black tea/coffee is best but using a small amount of whole-fat dairy products in coffee/tea is acceptable).  -   Poor Hydration Sources: anything with sugar or artificial sweeteners added to it  These recommendations have been developed for patients that are actively receiving medical care from either Dr. Markhi Kleckner or Sarah Gray, DNP, NP-C at [facility]. These recommendations are developed for patients with specific medical conditions and are not meant to be  distributed or used by others that are not actively receiving care from either provider listed above at [facility]. It is not appropriate to participate in the above eating plans without proper medical supervision.   Reference: Fung, J. The obesity code. [place]: [place]; 2016.   

## 2019-12-28 LAB — T3, FREE: T3, Free: 3.2 pg/mL (ref 2.3–4.2)

## 2019-12-28 LAB — TSH: TSH: 0.94 mIU/L (ref 0.40–4.50)

## 2019-12-28 LAB — T4: T4, Total: 10.4 ug/dL (ref 5.1–11.9)

## 2019-12-28 MED FILL — LETROZOLE 2.5 MG TABS: 2.5 | 90 days supply | Qty: 90 | Fill #0

## 2019-12-28 MED FILL — SHINGRIX 50 MCG SUS: 50 | 1 days supply | Qty: 1 | Fill #0

## 2019-12-31 LAB — CARDIO IQ INSULIN RESISTANCE PANEL WITH SCORE
C-PEPTIDE, LC/MS/MS: 3.69 ng/mL — ABNORMAL HIGH (ref 0.68–2.16)
INSULIN, INTACT, LC/MS/MS: 18 u[IU]/mL — ABNORMAL HIGH (ref <=16)
Insulin Resistance Score: 96 — ABNORMAL HIGH (ref <=66)

## 2019-12-31 LAB — CARDIO IQ ADV LIPID AND INFLAMM PNL
Apolipoprotein B: 92 mg/dL — ABNORMAL HIGH (ref <90)
Cholesterol: 187 mg/dL (ref <200)
HDL: 53 mg/dL (ref >49)
LDL Cholesterol (Calc): 108 mg/dL (calc) — ABNORMAL HIGH (ref <100)
LDL Large: 5414 nmol/L — ABNORMAL LOW (ref >6729)
LDL Medium: 283 nmol/L — ABNORMAL HIGH (ref <215)
LDL Particle Number: 1264 nmol/L — ABNORMAL HIGH (ref <1138)
LDL Peak Size: 219.9 Angstrom — ABNORMAL LOW (ref >222.9)
LDL Small: 216 nmol/L — ABNORMAL HIGH (ref <142)
Lipoprotein (a): 204 nmol/L — ABNORMAL HIGH (ref <75)
Non-HDL Cholesterol (Calc): 134 mg/dL (calc) — ABNORMAL HIGH (ref <130)
PLAC: 84 nmol/min/mL (ref <124)
Total CHOL/HDL Ratio: 3.5 calc (ref <3.6)
Triglycerides: 148 mg/dL (ref <150)
hs-CRP: 2.4 mg/L — ABNORMAL HIGH (ref <1.0)

## 2020-01-23 ENCOUNTER — Other Ambulatory Visit: Payer: Self-pay

## 2020-01-23 ENCOUNTER — Encounter (INDEPENDENT_AMBULATORY_CARE_PROVIDER_SITE_OTHER): Payer: Self-pay | Admitting: Internal Medicine

## 2020-01-23 ENCOUNTER — Ambulatory Visit (INDEPENDENT_AMBULATORY_CARE_PROVIDER_SITE_OTHER): Payer: 59 | Admitting: Internal Medicine

## 2020-01-23 VITALS — BP 122/70 | HR 87 | Temp 97.4°F | Resp 18 | Ht 64.0 in | Wt 186.0 lb

## 2020-01-23 DIAGNOSIS — E8881 Metabolic syndrome: Secondary | ICD-10-CM | POA: Diagnosis not present

## 2020-01-23 DIAGNOSIS — E782 Mixed hyperlipidemia: Secondary | ICD-10-CM

## 2020-01-23 DIAGNOSIS — R5383 Other fatigue: Secondary | ICD-10-CM

## 2020-01-23 DIAGNOSIS — R5381 Other malaise: Secondary | ICD-10-CM

## 2020-01-23 NOTE — Progress Notes (Signed)
Metrics: Intervention Frequency ACO  Documented Smoking Status Yearly  Screened one or more times in 24 months  Cessation Counseling or  Active cessation medication Past 24 months  Past 24 months   Guideline developer: UpToDate (See UpToDate for funding source) Date Released: 2014       Wellness Office Visit  Subjective:  Patient ID: Connie Clarke, female    DOB: December 18, 1955  Age: 64 y.o. MRN: [id number]  CC: This lady comes in for follow-up to discuss her blood work and further recommendations. HPI  I discussed her cardio IQ lipid panel which is not too bad but this was on statin therapy. Her insulin resistance score does show that she definitely has insulin resistance. Thyroid function test not too bad.  Past Medical History:  Diagnosis Date  . Anxiety   . Breast cancer ([place]) 2019   Left Breast Cancer  . Breast cancer, left ([place])   . Coughing    WHITEISH SPUTUM ONSET 11-17-17 PER PATIENT   . Hyperlipidemia   . Hypertension   . Infertility, female   . Joint pain   . Nasal congestion   . Ovarian cyst   . Palpitations    REPORTS NO LONGER OCCURS   . Personal history of radiation therapy 2019   Left Breast Cancer  . Vitamin D deficiency    Past Surgical History:  Procedure Laterality Date  . BREAST LUMPECTOMY Left 09/11/2017  . BREAST LUMPECTOMY WITH RADIOACTIVE SEED AND SENTINEL LYMPH NODE BIOPSY Left 09/11/2017   Procedure: LEFT BREAST LUMPECTOMY WITH RADIOACTIVE SEED AND SENTINEL LYMPH NODE BIOPSY;  Surgeon: [name], MD;  Location: [facility];  Service: General;  Laterality: Left;  . DILATION AND CURETTAGE OF UTERUS  1990  . ROBOTIC ASSISTED TOTAL HYSTERECTOMY WITH BILATERAL SALPINGO OOPHERECTOMY Bilateral 11/23/2017   Procedure: XI ROBOTIC ASSISTED TOTAL HYSTERECTOMY WITH BILATERAL SALPINGO OOPHORECTOMY,;  Surgeon: Everitt Amber, MD;  Location: [facility];  Service: Gynecology;  Laterality: Bilateral;     Family History  Problem Relation Age of  Onset  . Stroke Mother   . Anxiety disorder Mother   . Obesity Mother   . Diabetes Father   . Hyperlipidemia Father   . Stroke Father   . Depression Father   . Obesity Father   . Melanoma Sister   . Prostate cancer Brother   . 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   Social History   Tobacco Use  . Smoking status: Former Smoker    Years: 3.00    Types: Cigarettes    Quit date: 1980    Years since quitting: 41.6  . Smokeless tobacco: Never Used  Substance Use Topics  . Alcohol use: Yes    Comment: OCC    Current Meds  Medication Sig  . acetaminophen (TYLENOL) 325 MG tablet Take 650 mg by mouth every 6 (six) hours as needed (FOR PAIN.).  [name] ALPRAZolam (XANAX) 0.25 MG tablet Take 1 tablet (0.25 mg total) by mouth at bedtime as needed for anxiety.  . Cholecalciferol (CVS VIT D 5000 HIGH-POTENCY) 5000 units capsule Take 1 capsule (5,000 Units total) by mouth daily.  . Coenzyme Q10 (COQ10) 400 MG CAPS Take 400 mg by mouth daily.  [name] ibuprofen (ADVIL,MOTRIN) 200 MG tablet Take 200 mg by mouth every 8 (eight) hours as needed (FOR PAIN.).  [name] letrozole (FEMARA) 2.5 MG tablet Take 1 tablet (  2.5 mg total) by mouth daily.  [name] lisinopril (ZESTRIL) 10 MG tablet Take 1 tablet (10 mg total) by mouth daily.  . Magnesium 400 MG CAPS Take 400 mg by mouth daily.  . Omega-3 Fatty Acids (FISH OIL) 1000 MG CAPS Take 1,000 mg by mouth daily.  . Turmeric Curcumin 500 MG CAPS Take 500 mg by mouth daily.       Depression screen [facility] 2/9 11/01/2018 12/24/2017 09/21/2017 05/21/2016  Decreased Interest 0 0 0 2  Down, Depressed, Hopeless 0 0 0 3  PHQ - 2 Score 0 0 0 5  Altered sleeping - - - 1  Tired, decreased energy - - - 2  Change in appetite - - - 3  Feeling bad or failure about yourself  - - - 3  Trouble concentrating - - - 1  Moving slowly or fidgety/restless - - - 1  Suicidal thoughts -  - - 0  PHQ-9 Score - - - 16     Objective:   Today's Vitals: BP 122/70 (BP Location: Right Arm, Patient Position: Sitting, Cuff Size: Normal)   Pulse 87   Temp (!) 97.4 F (36.3 C) (Temporal)   Resp 18   Ht 5\' 4"  (1.626 m)   Wt 186 lb (84.4 kg)   LMP 06/02/2005 (Approximate)   SpO2 98%   BMI 31.93 kg/m  Vitals with BMI 01/23/2020 12/27/2019 10/10/2019  Height 5\' 4"  5\' 4"  5\' 4"   Weight 186 lbs 186 lbs 13 oz 187 lbs 14 oz  BMI 31.91 07.62 26.33  Systolic 354 562 563  Diastolic 70 70 73  Pulse 87 92 92     Physical Exam   She looks systemically well.  Her weight is unchanged and she remains obese.  Blood pressure is good.    Assessment   1. Insulin resistance   2. Mixed hyperlipidemia   3. Malaise and fatigue       Tests ordered No orders of the defined types were placed in this encounter.    Plan: 1. We discussed all her results and detail.  I discussed the blue zones and a mostly plant-based diet again combined with intermittent fasting. 2. We discussed exercise and I answered questions regarding strength training and cardiovascular training including interval training. 3. I will see her for follow-up in 3 months to see how she is doing.  Today I spent at least 45 minutes with the patient discussing all her results in detail and answering all her questions.   No orders of the defined types were placed in this encounter.   Doree Albee, MD

## 2020-02-16 MED FILL — LISINOPRIL 10 MG TABS: 10 | 90 days supply | Qty: 90 | Fill #1

## 2020-02-28 ENCOUNTER — Encounter (INDEPENDENT_AMBULATORY_CARE_PROVIDER_SITE_OTHER): Payer: Self-pay | Admitting: Internal Medicine

## 2020-03-01 ENCOUNTER — Other Ambulatory Visit (INDEPENDENT_AMBULATORY_CARE_PROVIDER_SITE_OTHER): Payer: Self-pay | Admitting: Internal Medicine

## 2020-03-30 MED FILL — LETROZOLE 2.5 MG TABS: 2.5 | 90 days supply | Qty: 90 | Fill #1

## 2020-04-06 ENCOUNTER — Other Ambulatory Visit (HOSPITAL_COMMUNITY): Payer: Self-pay | Admitting: Internal Medicine

## 2020-04-06 MED FILL — SHINGRIX 50 MCG SUS: 50 | 1 days supply | Qty: 1 | Fill #1

## 2020-04-16 ENCOUNTER — Ambulatory Visit (INDEPENDENT_AMBULATORY_CARE_PROVIDER_SITE_OTHER): Payer: 59 | Admitting: Internal Medicine

## 2020-04-16 ENCOUNTER — Other Ambulatory Visit: Payer: Self-pay

## 2020-04-16 ENCOUNTER — Encounter (INDEPENDENT_AMBULATORY_CARE_PROVIDER_SITE_OTHER): Payer: Self-pay | Admitting: Internal Medicine

## 2020-04-16 VITALS — BP 118/80 | HR 79 | Temp 97.1°F | Ht 64.0 in | Wt 184.4 lb

## 2020-04-16 DIAGNOSIS — I10 Essential (primary) hypertension: Secondary | ICD-10-CM

## 2020-04-16 DIAGNOSIS — E559 Vitamin D deficiency, unspecified: Secondary | ICD-10-CM

## 2020-04-16 DIAGNOSIS — E8881 Metabolic syndrome: Secondary | ICD-10-CM

## 2020-04-16 DIAGNOSIS — E782 Mixed hyperlipidemia: Secondary | ICD-10-CM

## 2020-04-16 NOTE — Progress Notes (Signed)
Metrics: Intervention Frequency ACO  Documented Smoking Status Yearly  Screened one or more times in 24 months  Cessation Counseling or  Active cessation medication Past 24 months  Past 24 months   Guideline developer: UpToDate (See UpToDate for funding source) Date Released: 2014       Wellness Office Visit  Subjective:  Patient ID: Connie Clarke, female    DOB: 26-Jun-1955  Age: 64 y.o. MRN: [id number]  CC: This lady comes in for follow-up of hyperlipidemia, hypertension, vitamin D deficiency and insulin resistance. HPI  This lady has a history of breast cancer diagnosed in 2019. Since the last visit, she has gone to more of a plant-based diet and feels healthier on this and more energized. She continues on lisinopril for hypertension. She also is taking Femara due to her previous history of breast cancer. Past Medical History:  Diagnosis Date  . Anxiety   . Breast cancer ([place]) 2019   Left Breast Cancer  . Breast cancer, left ([place])   . Coughing    WHITEISH SPUTUM ONSET 11-17-17 PER PATIENT   . Hyperlipidemia   . Hypertension   . Infertility, female   . Joint pain   . Nasal congestion   . Ovarian cyst   . Palpitations    REPORTS NO LONGER OCCURS   . Personal history of radiation therapy 2019   Left Breast Cancer  . Vitamin D deficiency    Past Surgical History:  Procedure Laterality Date  . BREAST LUMPECTOMY Left 09/11/2017  . BREAST LUMPECTOMY WITH RADIOACTIVE SEED AND SENTINEL LYMPH NODE BIOPSY Left 09/11/2017   Procedure: LEFT BREAST LUMPECTOMY WITH RADIOACTIVE SEED AND SENTINEL LYMPH NODE BIOPSY;  Surgeon: [name], MD;  Location: [facility];  Service: General;  Laterality: Left;  . DILATION AND CURETTAGE OF UTERUS  1990  . ROBOTIC ASSISTED TOTAL HYSTERECTOMY WITH BILATERAL SALPINGO OOPHERECTOMY Bilateral 11/23/2017   Procedure: XI ROBOTIC ASSISTED TOTAL HYSTERECTOMY WITH BILATERAL SALPINGO OOPHORECTOMY,;  Surgeon: Everitt Amber, MD;  Location:  [facility];  Service: Gynecology;  Laterality: Bilateral;     Family History  Problem Relation Age of Onset  . Stroke Mother   . Anxiety disorder Mother   . Obesity Mother   . Diabetes Father   . Hyperlipidemia Father   . Stroke Father   . Depression Father   . Obesity Father   . Melanoma Sister   . Prostate cancer Brother   . 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   Social History   Tobacco Use  . Smoking status: Former Smoker    Years: 3.00    Types: Cigarettes    Quit date: 1980    Years since quitting: 41.9  . Smokeless tobacco: Never Used  Substance Use Topics  . Alcohol use: Yes    Comment: OCC    Current Meds  Medication Sig  . acetaminophen (TYLENOL) 325 MG tablet Take 650 mg by mouth every 6 (six) hours as needed (FOR PAIN.).  [name] ALPRAZolam (XANAX) 0.25 MG tablet Take 1 tablet (0.25 mg total) by mouth at bedtime as needed for anxiety.  . Cholecalciferol (CVS VIT D 5000 HIGH-POTENCY) 5000 units capsule Take 1 capsule (5,000 Units total) by mouth daily.  . Coenzyme Q10 (COQ10) 400 MG CAPS Take 400 mg by mouth daily.  [name] ibuprofen (ADVIL,MOTRIN) 200 MG tablet Take 200 mg by mouth every 8 (eight)  hours as needed (FOR PAIN.).  [name] letrozole (FEMARA) 2.5 MG tablet Take 1 tablet (2.5 mg total) by mouth daily.  [name] lisinopril (ZESTRIL) 10 MG tablet Take 1 tablet (10 mg total) by mouth daily.  . Magnesium 400 MG CAPS Take 400 mg by mouth daily.  . Omega-3 Fatty Acids (FISH OIL) 1000 MG CAPS Take 1,000 mg by mouth daily.  . Turmeric Curcumin 500 MG CAPS Take 500 mg by mouth daily.      Depression screen [facility] 2/9 04/16/2020 11/01/2018 12/24/2017 09/21/2017 05/21/2016  Decreased Interest 0 0 0 0 2  Down, Depressed, Hopeless 0 0 0 0 3  PHQ - 2 Score 0 0 0 0 5  Altered sleeping 0 - - - 1  Tired, decreased energy 0 - - - 2  Change in appetite 0 - - - 3  Feeling bad or  failure about yourself  0 - - - 3  Trouble concentrating 0 - - - 1  Moving slowly or fidgety/restless 0 - - - 1  Suicidal thoughts 0 - - - 0  PHQ-9 Score 0 - - - 16  Difficult doing work/chores Not difficult at all - - - -     Objective:   Today's Vitals: BP 118/80   Pulse 79   Temp (!) 97.1 F (36.2 C) (Temporal)   Ht 5\' 4"  (1.626 m)   Wt 184 lb 6.4 oz (83.6 kg)   LMP 06/02/2005 (Approximate)   SpO2 97%   BMI 31.65 kg/m  Vitals with BMI 04/16/2020 01/23/2020 12/27/2019  Height 5\' 4"  5\' 4"  5\' 4"   Weight 184 lbs 6 oz 186 lbs 186 lbs 13 oz  BMI 31.64 70.17 79.39  Systolic 030 092 330  Diastolic 80 70 70  Pulse 79 87 92     Physical Exam  She looks systemically well.  She has lost about 2 pounds since the last visit.  Blood pressure is in a good range.     Assessment   1. Mixed hyperlipidemia   2. Essential hypertension   3. Vitamin D deficiency   4. Insulin resistance       Tests ordered No orders of the defined types were placed in this encounter.    Plan: 1. She will continue with lisinopril for hypertension.  This seems to be keeping her blood pressure under good range. 2. She will continue to work on nutrition and exercise as she has been doing. 3. She will continue with vitamin D3 supplementation for vitamin D deficiency. 4. Today I introduced the concept of thyroid optimization to help with insulin resistance.  She will think about this and perhaps we can discuss this on the next visit. 5. Follow-up in 3 to 4 months and we will do blood work again.   No orders of the defined types were placed in this encounter.   Doree Albee, MD

## 2020-04-30 DIAGNOSIS — J069 Acute upper respiratory infection, unspecified: Secondary | ICD-10-CM | POA: Diagnosis not present

## 2020-04-30 DIAGNOSIS — I10 Essential (primary) hypertension: Secondary | ICD-10-CM | POA: Diagnosis not present

## 2020-04-30 DIAGNOSIS — R0989 Other specified symptoms and signs involving the circulatory and respiratory systems: Secondary | ICD-10-CM | POA: Diagnosis not present

## 2020-04-30 DIAGNOSIS — E7800 Pure hypercholesterolemia, unspecified: Secondary | ICD-10-CM | POA: Diagnosis not present

## 2020-05-10 DIAGNOSIS — E7800 Pure hypercholesterolemia, unspecified: Secondary | ICD-10-CM | POA: Diagnosis not present

## 2020-05-10 DIAGNOSIS — I10 Essential (primary) hypertension: Secondary | ICD-10-CM | POA: Diagnosis not present

## 2020-05-15 DIAGNOSIS — C50912 Malignant neoplasm of unspecified site of left female breast: Secondary | ICD-10-CM | POA: Diagnosis not present

## 2020-05-29 MED FILL — LISINOPRIL 10 MG TABS: 10 | 90 days supply | Qty: 90 | Fill #2

## 2020-06-06 MED FILL — BOOSTRIX VACCINE SYRINGE: 5-2.5-18.5 | 1 days supply | Qty: 1 | Fill #0

## 2020-07-03 MED FILL — LETROZOLE 2.5 MG TABS: 2.5 | 90 days supply | Qty: 90 | Fill #2

## 2020-07-03 MED FILL — ROSUVASTATIN CALCIUM 10 MG: 10 | 90 days supply | Qty: 90 | Fill #1

## 2020-07-23 ENCOUNTER — Other Ambulatory Visit: Payer: Self-pay | Admitting: Hematology and Oncology

## 2020-07-23 DIAGNOSIS — Z9889 Other specified postprocedural states: Secondary | ICD-10-CM

## 2020-08-13 ENCOUNTER — Ambulatory Visit (INDEPENDENT_AMBULATORY_CARE_PROVIDER_SITE_OTHER): Payer: 59 | Admitting: Internal Medicine

## 2020-08-29 MED FILL — LISINOPRIL 10 MG TABS: 10 | 90 days supply | Qty: 90 | Fill #3

## 2020-09-03 ENCOUNTER — Other Ambulatory Visit: Payer: Self-pay

## 2020-09-03 ENCOUNTER — Ambulatory Visit
Admission: RE | Admit: 2020-09-03 | Discharge: 2020-09-03 | Disposition: A | Discharge status: Home / Self Care | Payer: 59 | Source: Ambulatory Visit | Attending: Hematology and Oncology | Admitting: Hematology and Oncology

## 2020-09-03 DIAGNOSIS — Z9889 Other specified postprocedural states: Secondary | ICD-10-CM

## 2020-09-03 DIAGNOSIS — R928 Other abnormal and inconclusive findings on diagnostic imaging of breast: Secondary | ICD-10-CM | POA: Diagnosis not present

## 2020-10-07 NOTE — Progress Notes (Signed)
Patient Care Team: Doree Albee, MD as PCP - General (Internal Medicine) Nicholas Lose, MD as Consulting Physician (Hematology and Oncology) Delice Bison, Charlestine Massed, NP as Nurse Practitioner (Hematology and Oncology) [name], MD as Consulting Physician (General Surgery) Gery Pray, MD as Consulting Physician (Radiation Oncology)  DIAGNOSIS:    ICD-10-CM   1. Malignant neoplasm of upper-outer quadrant of left breast in female, estrogen receptor positive ([place])  C50.412    Z17.0     SUMMARY OF ONCOLOGIC HISTORY: Oncology History  Malignant neoplasm of upper-outer quadrant of left breast in female, estrogen receptor positive ([place])  08/27/2017 Initial Diagnosis   Screening mammogram left breast showed possible asymmetry.  Targeted ultrasound revealed a 5 x 4 x 7 mm irregular hypoechoic mass 2 o'clock position 5 cm from nipple, no lymphadenopathy, biopsy showed IDC grade 2 with focal DCIS intermediate grade, HER-2 negative ratio 1.32, T1b N0 stage I a clinical stage AJCC 8   09/11/2017 Surgery   Left lumpectomy: 0.7 cm IDC 0/1 lymph node negative, margins negative, grade 2, ER 100%, PR 100%, HER-2 negative ratio 1.32, Ki-67 2%, T1 be N0 stage I a   09/11/2017 Oncotype testing   Oncotype DX recurrence score 20: 6% risk of recurrence with hormone therapy alone   10/20/2017 - 11/18/2017 Radiation Therapy   1. Left Breast / 40.05 Gy in 15 fractions 2. Left Breast Boost / 10 Gy in 5 fractions   10/2017 Genetic Testing   Done at [facility] with Dr. Helane Rima, negative testing of myriad panel.   12/19/2017 -  Anti-estrogen oral therapy   Letrozole daily   03/11/2018 Cancer Staging   Staging form: Breast, AJCC 8th Edition - Pathologic: Stage IA (pT1b, pN0, cM0, G2, ER+, PR+, HER2-, Oncotype DX score: 20) - Signed by Gardenia Phlegm, NP on 03/11/2018     CHIEF COMPLIANT: Follow-upof left breast canceron letrozole therapy  INTERVAL HISTORY: Connie Clarke  is a 65 y.o. with above-mentioned history of left breast cancer treated with lumpectomy, radiation, and is currently on antiestrogen therapy with letrozole.Mammogram on 09/03/20 showed no evidence of malignancy bilaterally. She presents to the clinic today for follow-up. She continues to suffer from muscle aches and pains from letrozole.  She is extremely worried about rising cholesterol levels.  Her total cholesterol was 292 and LDL 207 this is in December 2021.  She is now on Crestor.  She is having difficulty losing weight.  ALLERGIES:  is allergic to chlorhexidine and steri-strip compound benzoin [benzoin compound].  MEDICATIONS:  Current Outpatient Medications  Medication Sig Dispense Refill  . acetaminophen (TYLENOL) 325 MG tablet Take 650 mg by mouth every 6 (six) hours as needed (FOR PAIN.).    [name] ALPRAZolam (XANAX) 0.25 MG tablet Take 1 tablet (0.25 mg total) by mouth at bedtime as needed for anxiety. 30 tablet 3  . Cholecalciferol (CVS VIT D 5000 HIGH-POTENCY) 5000 units capsule Take 1 capsule (5,000 Units total) by mouth daily.    . Coenzyme Q10 (COQ10) 400 MG CAPS Take 400 mg by mouth daily.    [name] ibuprofen (ADVIL,MOTRIN) 200 MG tablet Take 200 mg by mouth every 8 (eight) hours as needed (FOR PAIN.).    [name] letrozole (FEMARA) 2.5 MG tablet TAKE 1 TABLET (2.5 MG TOTAL) BY MOUTH DAILY. 90 tablet 3  . lisinopril (ZESTRIL) 10 MG tablet Take 1 tablet (10 mg total) by mouth daily. 90 tablet 3  . lisinopril (ZESTRIL) 10 MG tablet TAKE 1 TABLET BY MOUTH DAILY.  90 tablet 3  . Magnesium 400 MG CAPS Take 400 mg by mouth daily.    . Omega-3 Fatty Acids (FISH OIL) 1000 MG CAPS Take 1,000 mg by mouth daily.    . rosuvastatin (CRESTOR) 10 MG tablet TAKE 1 TABLET BY MOUTH ONCE DAILY 90 tablet 1  . Tdap (BOOSTRIX) 5-2.5-18.5 LF-MCG/0.5 injection TO BE ADMINISTERED BY PHARMACIST .5 mL 0  . Turmeric Curcumin 500 MG CAPS Take 500 mg by mouth daily.    [name] Zoster Vaccine Adjuvanted [facility]) injection TO BE  ADMINISTERED BY PHARMACIST 1 each 1   No current facility-administered medications for this visit.    PHYSICAL EXAMINATION: ECOG PERFORMANCE STATUS: 1 - Symptomatic but completely ambulatory  Vitals:   10/08/20 0930  BP: 134/73  Pulse: 93  Resp: 16  Temp: (!) 97.5 F (36.4 C)  SpO2: 99%   Filed Weights   10/08/20 0930  Weight: 187 lb 6.4 oz (85 kg)    BREAST: No palpable masses or nodules in either right or left breasts. No palpable axillary supraclavicular or infraclavicular adenopathy no breast tenderness or nipple discharge. (exam performed in the presence of a chaperone)  LABORATORY DATA:  I have reviewed the data as listed CMP Latest Ref Rng & Units 11/02/2018 11/18/2017 06/22/2017  Glucose 70 - 99 mg/dL 98 95 106(H)  BUN 6 - 23 mg/dL 26(H) 20 22  Creatinine 0.40 - 1.20 mg/dL 0.79 0.72 0.82  Sodium 135 - 145 mEq/L 138 142 141  Potassium 3.5 - 5.1 mEq/L 4.5 4.2 5.0  Chloride 96 - 112 mEq/L 102 109 102  CO2 19 - 32 mEq/L '29 27 24  ' Calcium 8.4 - 10.5 mg/dL 9.6 9.1 9.8  Total Protein 6.0 - 8.3 g/dL 7.1 6.9 7.0  Total Bilirubin 0.2 - 1.2 mg/dL 0.5 0.5 0.3  Alkaline Phos 39 - 117 U/L 99 84 90  AST 0 - 37 U/L 23 35 29  ALT 0 - 35 U/L 43(H) 51 55(H)    Lab Results  Component Value Date   WBC 5.1 11/02/2018   HGB 14.5 11/02/2018   HCT 42.4 11/02/2018   MCV 88.2 11/02/2018   PLT 254.0 11/02/2018   NEUTROABS 3.2 11/02/2018    ASSESSMENT & PLAN:  Malignant neoplasm of upper-outer quadrant of left breast in female, estrogen receptor positive ([name]) 09/11/2017:Left lumpectomy: 0.7 cm IDC 0/1 lymph node negative, margins negative, grade 2, ER 100%, PR 100%, HER-2 negative ratio 1.32, Ki-67 2%, T1 be N0 stage I a Oncotype DX score 20: 6%.risk of recurrence Adjuvant radiation therapy: 10/20/2017-11/18/2017  Current treatment: Adjuvant antiestrogen therapy with letrozole 2.5 mg daily started 12/19/2017 discontinued 10/08/2020 and switched to anastrozole (because of elevated  cholesterol levels and muscle cramps)   Difficulty with sleeping: For the high cholesterol : She is on Crestor  Bone density: June 2019: Dr. Rolin Barry office: T score -1.1  Breast cancer surveillance: Mammogram  09/03/2020: Benign, breast density category A Breast exam 10/08/2020: Benign  I requested Cambrie to call us in a month to let us know how she is doing on anastrozole therapy. She plans to retire next year and hopes to travel and enjoy life.  She will still do as needed physical therapy.  Return to clinic in 1 year for follow-up    No orders of the defined types were placed in this encounter.  The patient has a good understanding of the overall plan. she agrees with it. she will call with any problems that may develop before the next  visit here.  Total time spent: 20 mins including face to face time and time spent for planning, charting and coordination of care  Rulon Eisenmenger, MD, MPH 10/08/2020  I, Cloyde Reams Dorshimer, am acting as scribe for Dr. Nicholas Lose.  I have reviewed the above documentation for accuracy and completeness, and I agree with the above.

## 2020-10-08 ENCOUNTER — Inpatient Hospital Stay: Payer: 59 | Attending: Hematology and Oncology | Admitting: Hematology and Oncology

## 2020-10-08 ENCOUNTER — Other Ambulatory Visit: Payer: Self-pay

## 2020-10-08 ENCOUNTER — Other Ambulatory Visit (HOSPITAL_COMMUNITY): Payer: Self-pay

## 2020-10-08 DIAGNOSIS — Z170 Estrogen receptor positive status [ER+]: Secondary | ICD-10-CM | POA: Insufficient documentation

## 2020-10-08 DIAGNOSIS — C50412 Malignant neoplasm of upper-outer quadrant of left female breast: Secondary | ICD-10-CM | POA: Diagnosis not present

## 2020-10-08 DIAGNOSIS — Z923 Personal history of irradiation: Secondary | ICD-10-CM | POA: Diagnosis not present

## 2020-10-08 DIAGNOSIS — E7800 Pure hypercholesterolemia, unspecified: Secondary | ICD-10-CM | POA: Insufficient documentation

## 2020-10-08 MED ORDER — ANASTROZOLE 1 MG PO TABS
1.0000 mg | ORAL_TABLET | Freq: Every day | ORAL | 3 refills | Status: DC
  Filled 2020-10-08: qty 90, 90d supply, fill #0

## 2020-10-08 NOTE — Assessment & Plan Note (Signed)
09/11/2017:Left lumpectomy: 0.7 cm IDC 0/1 lymph node negative, margins negative, grade 2, ER 100%, PR 100%, HER-2 negative ratio 1.32, Ki-67 2%, T1 be N0 stage I a Oncotype DX score 20: 6%.risk of recurrence Adjuvant radiation therapy: 10/20/2017-11/18/2017  Current treatment: Adjuvant antiestrogen therapy with letrozole 2.5 mg daily started 12/19/2017 Letrozole toxicities: 1.  Muscle cramps are manageable. 2.  Mild hot flashes  Difficulty with sleeping: This is her main complaint today. For the high cholesterol she does not want to take cholesterol medication.  She will try red rice yeast extract.  She is seeing a new primary care physician to discuss this.  Bone density: June 2019: Dr. Rolin Barry office: T score -1.1  Breast cancer surveillance: Mammogram  09/03/2020: Benign, breast density category A Breast exam 10/08/2020: Benign  Return to clinic in 1 year for follow-up

## 2020-10-09 ENCOUNTER — Other Ambulatory Visit (INDEPENDENT_AMBULATORY_CARE_PROVIDER_SITE_OTHER): Payer: Self-pay

## 2020-10-09 ENCOUNTER — Other Ambulatory Visit (HOSPITAL_COMMUNITY): Payer: Self-pay

## 2020-10-09 MED ORDER — ROSUVASTATIN CALCIUM 10 MG PO TABS
10.0000 mg | ORAL_TABLET | Freq: Every day | ORAL | 0 refills | Status: DC
  Filled 2020-10-09: qty 90, 90d supply, fill #0

## 2020-10-10 ENCOUNTER — Other Ambulatory Visit (HOSPITAL_COMMUNITY): Payer: Self-pay

## 2020-10-10 MED ORDER — CARESTART COVID-19 HOME TEST VI KIT
PACK | 0 refills | Status: DC
  Filled 2020-10-10: qty 4, 4d supply, fill #0

## 2020-10-19 ENCOUNTER — Other Ambulatory Visit (HOSPITAL_COMMUNITY): Payer: Self-pay

## 2020-10-24 ENCOUNTER — Other Ambulatory Visit (HOSPITAL_COMMUNITY): Payer: Self-pay

## 2020-10-24 MED ORDER — CARESTART COVID-19 HOME TEST VI KIT
PACK | 0 refills | Status: DC
  Filled 2020-10-24: qty 4, 4d supply, fill #0

## 2020-11-14 DIAGNOSIS — E7800 Pure hypercholesterolemia, unspecified: Secondary | ICD-10-CM | POA: Diagnosis not present

## 2020-12-04 ENCOUNTER — Other Ambulatory Visit (HOSPITAL_COMMUNITY): Payer: Self-pay

## 2020-12-04 DIAGNOSIS — I10 Essential (primary) hypertension: Secondary | ICD-10-CM | POA: Diagnosis not present

## 2020-12-04 DIAGNOSIS — F419 Anxiety disorder, unspecified: Secondary | ICD-10-CM | POA: Diagnosis not present

## 2020-12-04 DIAGNOSIS — E7800 Pure hypercholesterolemia, unspecified: Secondary | ICD-10-CM | POA: Diagnosis not present

## 2020-12-04 MED ORDER — VENLAFAXINE HCL ER 37.5 MG PO CP24
37.5000 mg | ORAL_CAPSULE | Freq: Every day | ORAL | 1 refills | Status: DC
  Filled 2020-12-04: qty 90, 90d supply, fill #0

## 2020-12-05 ENCOUNTER — Other Ambulatory Visit (HOSPITAL_BASED_OUTPATIENT_CLINIC_OR_DEPARTMENT_OTHER): Payer: Self-pay | Admitting: Family Medicine

## 2020-12-05 ENCOUNTER — Other Ambulatory Visit (INDEPENDENT_AMBULATORY_CARE_PROVIDER_SITE_OTHER): Payer: Self-pay

## 2020-12-05 DIAGNOSIS — E7800 Pure hypercholesterolemia, unspecified: Secondary | ICD-10-CM

## 2020-12-06 ENCOUNTER — Other Ambulatory Visit (HOSPITAL_COMMUNITY): Payer: Self-pay

## 2020-12-06 MED ORDER — LISINOPRIL 10 MG PO TABS
10.0000 mg | ORAL_TABLET | Freq: Every day | ORAL | 2 refills | Status: DC
  Filled 2020-12-06: qty 90, 90d supply, fill #0
  Filled 2021-02-27: qty 90, 90d supply, fill #1

## 2020-12-07 ENCOUNTER — Other Ambulatory Visit (HOSPITAL_COMMUNITY): Payer: Self-pay

## 2020-12-07 MED ORDER — CARESTART COVID-19 HOME TEST VI KIT
PACK | 0 refills | Status: DC
  Filled 2020-12-07: qty 4, 4d supply, fill #0

## 2020-12-10 ENCOUNTER — Other Ambulatory Visit: Payer: Self-pay

## 2020-12-10 ENCOUNTER — Ambulatory Visit (HOSPITAL_BASED_OUTPATIENT_CLINIC_OR_DEPARTMENT_OTHER)
Admission: RE | Admit: 2020-12-10 | Discharge: 2020-12-10 | Disposition: A | Discharge status: Home / Self Care | Payer: 59 | Source: Ambulatory Visit | Attending: Family Medicine | Admitting: Family Medicine

## 2020-12-10 DIAGNOSIS — E7800 Pure hypercholesterolemia, unspecified: Secondary | ICD-10-CM | POA: Insufficient documentation

## 2020-12-17 ENCOUNTER — Other Ambulatory Visit (HOSPITAL_COMMUNITY): Payer: Self-pay

## 2020-12-17 MED ORDER — ROSUVASTATIN CALCIUM 5 MG PO TABS
5.0000 mg | ORAL_TABLET | Freq: Every day | ORAL | 2 refills | Status: AC
  Filled 2020-12-17: qty 90, 90d supply, fill #0
  Filled 2021-02-27: qty 90, 90d supply, fill #1

## 2021-01-01 DIAGNOSIS — H524 Presbyopia: Secondary | ICD-10-CM | POA: Diagnosis not present

## 2021-01-01 DIAGNOSIS — H52223 Regular astigmatism, bilateral: Secondary | ICD-10-CM | POA: Diagnosis not present

## 2021-01-01 DIAGNOSIS — H2510 Age-related nuclear cataract, unspecified eye: Secondary | ICD-10-CM | POA: Diagnosis not present

## 2021-01-01 DIAGNOSIS — H5213 Myopia, bilateral: Secondary | ICD-10-CM | POA: Diagnosis not present

## 2021-01-17 DIAGNOSIS — Z01419 Encounter for gynecological examination (general) (routine) without abnormal findings: Secondary | ICD-10-CM | POA: Diagnosis not present

## 2021-01-17 DIAGNOSIS — Z6832 Body mass index (BMI) 32.0-32.9, adult: Secondary | ICD-10-CM | POA: Diagnosis not present

## 2021-01-22 ENCOUNTER — Encounter: Payer: Self-pay | Admitting: Hematology and Oncology

## 2021-01-22 ENCOUNTER — Other Ambulatory Visit (HOSPITAL_COMMUNITY): Payer: Self-pay

## 2021-01-22 ENCOUNTER — Other Ambulatory Visit: Payer: Self-pay | Admitting: Hematology and Oncology

## 2021-01-22 DIAGNOSIS — Z170 Estrogen receptor positive status [ER+]: Secondary | ICD-10-CM

## 2021-01-22 DIAGNOSIS — C50412 Malignant neoplasm of upper-outer quadrant of left female breast: Secondary | ICD-10-CM

## 2021-01-22 MED ORDER — ALPRAZOLAM 0.25 MG PO TABS
0.2500 mg | ORAL_TABLET | Freq: Every evening | ORAL | 3 refills | Status: DC | PRN
  Filled 2021-01-22: qty 30, 30d supply, fill #0
  Filled 2021-02-27: qty 30, 30d supply, fill #1

## 2021-01-23 ENCOUNTER — Other Ambulatory Visit: Payer: Self-pay

## 2021-01-23 ENCOUNTER — Other Ambulatory Visit (HOSPITAL_COMMUNITY): Payer: Self-pay

## 2021-01-23 MED ORDER — LETROZOLE 2.5 MG PO TABS
2.5000 mg | ORAL_TABLET | Freq: Every day | ORAL | 0 refills | Status: DC
  Filled 2021-01-23: qty 90, 90d supply, fill #0

## 2021-01-25 ENCOUNTER — Ambulatory Visit (INDEPENDENT_AMBULATORY_CARE_PROVIDER_SITE_OTHER): Payer: 59 | Admitting: Adult Health

## 2021-01-25 ENCOUNTER — Other Ambulatory Visit (HOSPITAL_COMMUNITY): Payer: Self-pay

## 2021-01-25 ENCOUNTER — Encounter: Payer: Self-pay | Admitting: Adult Health

## 2021-01-25 ENCOUNTER — Other Ambulatory Visit: Payer: Self-pay

## 2021-01-25 VITALS — BP 145/81 | HR 84 | Ht 64.0 in | Wt 189.0 lb

## 2021-01-25 DIAGNOSIS — F411 Generalized anxiety disorder: Secondary | ICD-10-CM | POA: Diagnosis not present

## 2021-01-25 MED ORDER — SERTRALINE HCL 50 MG PO TABS
50.0000 mg | ORAL_TABLET | Freq: Every day | ORAL | 1 refills | Status: DC
  Filled 2021-01-25: qty 90, 90d supply, fill #0

## 2021-01-25 NOTE — Progress Notes (Signed)
[facility] MD/PA/NP Initial Note  01/25/2021 3:50 PM Connie Clarke  MRN:  [id number]  Chief Complaint:   HPI:   Describes mood today as "ok". Pleasant. Tearful. Mood symptoms - depression, anxiety - "a little", and irritability. Stating "I get tearful very easily". Reports some worry and rumination. Has a high tolerance for clutter. Doesn't do well with conflict. Stating "I want a little more reserve". Has a prescription for Xanax and uses occasionally. Has taken Celexa in the past and felt like it was helpful - experienced weight gain. Also tried Effexor and it caused an increase in blood pressure. Willing to consider Zoloft as an alternative. Stable interest and motivation. Taking medications as prescribed. Energy levels stable. Active, has a regular exercise routine. Walking and going to gym. Enjoys some usual interests and activities. Married. Lives with husband of 49 years - 2 grown daughters. Spending time with family. Appetite adequate. Weight stable - 189 pounds. Sleeps well most nights. Averages 6 hours. Focus and concentration stable. Completing tasks. Managing aspects of household. Works full time. Denies SI or HI.  Denies AH or VH.  Previous medication trials: Celexa, Effexor  Visit Diagnosis:    ICD-10-CM   1. GAD (generalized anxiety disorder)  F41.1 sertraline (ZOLOFT) 50 MG tablet      Past Psychiatric History: Denies psychiatric hospitalization.   Past Medical History:  Past Medical History:  Diagnosis Date   Anxiety    Breast cancer ([place]) 2019   Left Breast Cancer   Breast cancer, left ([place])    Coughing    [place] 11-17-17 PER PATIENT    Hyperlipidemia    Hypertension    Infertility, female    Joint pain    Nasal congestion    Ovarian cyst    Palpitations    REPORTS NO LONGER OCCURS    Personal history of radiation therapy 2019   Left Breast Cancer   Vitamin D deficiency     Past Surgical History:  Procedure Laterality Date   BREAST  LUMPECTOMY Left 09/11/2017   BREAST LUMPECTOMY WITH RADIOACTIVE SEED AND SENTINEL LYMPH NODE BIOPSY Left 09/11/2017   Procedure: LEFT BREAST LUMPECTOMY WITH RADIOACTIVE SEED AND SENTINEL LYMPH NODE BIOPSY;  Surgeon: [name], MD;  Location: [facility];  Service: General;  Laterality: Left;   DILATION AND CURETTAGE OF UTERUS  1990   ROBOTIC ASSISTED TOTAL HYSTERECTOMY WITH BILATERAL SALPINGO OOPHERECTOMY Bilateral 11/23/2017   Procedure: XI ROBOTIC ASSISTED TOTAL HYSTERECTOMY WITH BILATERAL SALPINGO OOPHORECTOMY,;  Surgeon: Everitt Amber, MD;  Location: [facility];  Service: Gynecology;  Laterality: Bilateral;    Family Psychiatric History: Denies any family history of mental illness.   Family History:  Family History  Problem Relation Age of Onset   Stroke Mother    Anxiety disorder Mother    Obesity Mother    Diabetes Father    Hyperlipidemia Father    Stroke Father    Depression Father    Obesity Father    Melanoma Sister    Prostate cancer Brother    Breast cancer Neg Hx     Social History:  Social History   Socioeconomic History   Marital status: Married    Spouse name: Not on file   Number of children: Not on file   Years of education: Not on file   Highest education level: Not on file  Occupational History   Occupation: Physical Therapist    Employer: [employer]  Tobacco Use   Smoking status: Former  Years: 3.00    Types: Cigarettes    Quit date: 1980    Years since quitting: 42.6   Smokeless tobacco: Never  Vaping Use   Vaping Use: Never used  Substance and Sexual Activity   Alcohol use: Yes    Comment: OCC   Drug use: Never   Sexual activity: Not on file  Other Topics Concern   Not on file  Social History Narrative   Married for 35 years.Lives with husband.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   Social Determinants of Health   Financial Resource Strain: Not on file  Food Insecurity: Not on file  Transportation Needs: Not on file   Physical Activity: Not on file  Stress: Not on file  Social Connections: Not on file    Allergies:  Allergies  Allergen Reactions   Chlorhexidine Hives    Surgical prep scrub   Nusurgepak Surgical Prep-Care     Other reaction(s): hives   Steri-Strip Compound Benzoin [Benzoin Compound] Hives    Metabolic Disorder Labs: Lab Results  Component Value Date   HGBA1C 5.6 11/02/2018   No results found for: PROLACTIN Lab Results  Component Value Date   CHOL 187 12/27/2019   TRIG 148 12/27/2019   HDL 53 12/27/2019   CHOLHDL 3.5 12/27/2019   VLDL 25.6 11/02/2018   LDLCALC 108 (H) 12/27/2019   LDLCALC 175 (H) 11/02/2018   Lab Results  Component Value Date   TSH 0.94 12/27/2019   TSH 1.230 05/21/2016    Therapeutic Level Labs: No results found for: LITHIUM No results found for: VALPROATE No components found for:  CBMZ  Current Medications: Current Outpatient Medications  Medication Sig Dispense Refill   letrozole (FEMARA) 2.5 MG tablet Take 1 tablet (2.5 mg total) by mouth daily. 90 tablet 0   sertraline (ZOLOFT) 50 MG tablet Take 1 tablet (50 mg total) by mouth daily. 90 tablet 1   acetaminophen (TYLENOL) 325 MG tablet Take 650 mg by mouth every 6 (six) hours as needed (FOR PAIN.).     ALPRAZolam (XANAX) 0.25 MG tablet Take 1 tablet (0.25 mg total) by mouth at bedtime as needed for anxiety. 30 tablet 3   Cholecalciferol (CVS VIT D 5000 HIGH-POTENCY) 5000 units capsule Take 1 capsule (5,000 Units total) by mouth daily.     Coenzyme Q10 (COQ10) 400 MG CAPS Take 400 mg by mouth daily.     COVID-19 At Home Antigen Test [facility] COVID-19 HOME TEST) KIT use as directed 4 each 0   COVID-19 At Home Antigen Test ([facility] COVID-19 HOME TEST) KIT Use as directed 4 each 0   COVID-19 At Home Antigen Test ([facility] COVID-19 HOME TEST) KIT Use as directed 4 each 0   ibuprofen (ADVIL,MOTRIN) 200 MG tablet Take 200 mg by mouth every 8 (eight) hours as needed (FOR PAIN.).      lisinopril (ZESTRIL) 10 MG tablet TAKE 1 TABLET BY MOUTH DAILY. 90 tablet 3   lisinopril (ZESTRIL) 10 MG tablet Take 1 tablet (10 mg total) by mouth daily. 90 tablet 2   Magnesium 400 MG CAPS Take 400 mg by mouth daily.     Omega-3 Fatty Acids (FISH OIL) 1000 MG CAPS Take 1,000 mg by mouth daily.     rosuvastatin (CRESTOR) 10 MG tablet TAKE 1 TABLET BY MOUTH ONCE DAILY 90 tablet 1   rosuvastatin (CRESTOR) 10 MG tablet Take 1 tablet (10 mg total) by mouth daily. 90 tablet 0   rosuvastatin (CRESTOR) 5 MG tablet Take 1 tablet (5 mg  total) by mouth daily. 90 tablet 2   Tdap (BOOSTRIX) 5-2.5-18.5 LF-MCG/0.5 injection TO BE ADMINISTERED BY PHARMACIST .5 mL 0   Turmeric Curcumin 500 MG CAPS Take 500 mg by mouth daily.     No current facility-administered medications for this visit.    Medication Side Effects: none  Orders placed this visit:  No orders of the defined types were placed in this encounter.   Psychiatric Specialty Exam:  Review of Systems  Blood pressure (!) 145/81, pulse 84, height 5' 4" (1.626 m), weight 189 lb (85.7 kg), last menstrual period 06/02/2005.Body mass index is 32.44 kg/m.  General Appearance: Casual and Neat  Eye Contact:  Good  Speech:  Clear and Coherent and Normal Rate  Volume:  Normal  Mood:  Anxious  Affect:  Appropriate and Congruent  Thought Process:  Coherent and Descriptions of Associations: Intact  Orientation:  Full (Time, Place, and Person)  Thought Content: Logical   Suicidal Thoughts:  No  Homicidal Thoughts:  No  Memory:  WNL  Judgement:  Good  Insight:  Good  Psychomotor Activity:  Normal  Concentration:  Concentration: Good  Recall:  Good  Fund of Knowledge: Good  Language: Good  Assets:  Communication Skills Desire for Improvement Financial Resources/Insurance Housing Intimacy Leisure Time Physical Health Resilience Social Support Talents/Skills Transportation Vocational/Educational  ADL's:  Intact  Cognition: WNL   Prognosis:  Good   Screenings:  GAD-7    Flowsheet Row Video Visit from 11/01/2018 in [facility]  Total GAD-7 Score 9      PHQ2-9    [facility] Office Visit from 04/16/2020 in [facility] Video Visit from 11/01/2018 in [facility] Follow Up Appointment 15 from 12/24/2017 in [facility] from 09/21/2017 in [facility] Office Visit from 05/21/2016 in [facility]  PHQ-2 Total Score 0 0 0 0 5  PHQ-9 Total Score 0 -- -- -- 16       Receiving Psychotherapy: No   Treatment Plan/Recommendations:   Plan:  PDMP reviewed  Add Zoloft 3m daily  Time spent with patient was 45 minutes. Greater than 50% of face to face time with patient was spent on counseling and coordination of care.    RTC 4 weeks  Patient advised to contact office with any questions, adverse effects, or acute worsening in signs and symptoms.      RAloha Gell NP

## 2021-02-25 ENCOUNTER — Ambulatory Visit: Payer: 59 | Admitting: Adult Health

## 2021-02-27 ENCOUNTER — Other Ambulatory Visit (HOSPITAL_COMMUNITY): Payer: Self-pay

## 2021-02-27 MED ORDER — CARESTART COVID-19 HOME TEST VI KIT
PACK | 0 refills | Status: DC
  Filled 2021-02-27: qty 4, 4d supply, fill #0

## 2021-02-28 ENCOUNTER — Other Ambulatory Visit (HOSPITAL_COMMUNITY): Payer: Self-pay

## 2021-03-01 ENCOUNTER — Other Ambulatory Visit (HOSPITAL_COMMUNITY): Payer: Self-pay

## 2021-04-15 ENCOUNTER — Ambulatory Visit: Payer: Self-pay | Attending: Internal Medicine

## 2021-04-15 DIAGNOSIS — Z23 Encounter for immunization: Secondary | ICD-10-CM

## 2021-04-15 NOTE — Progress Notes (Signed)
   Covid-19 Vaccination Clinic  Name:  Connie Clarke    MRN: [id number] DOB: February 18, 1956  04/15/2021  [name] was observed post Covid-19 immunization for 15 minutes without incident. She was provided with Vaccine Information Sheet and instruction to access the V-Safe system.   [name] was instructed to call 911 with any severe reactions post vaccine: Difficulty breathing  Swelling of face and throat  A fast heartbeat  A bad rash all over body  Dizziness and weakness   Immunizations Administered     Name Date Dose VIS Date Route   Pfizer Covid-19 Vaccine Bivalent Booster 04/15/2021  1:11 PM 0.3 mL 01/30/2021 Intramuscular   Manufacturer: [place]   Lot: [id number]   [place]: [phone]

## 2021-05-03 ENCOUNTER — Other Ambulatory Visit (HOSPITAL_BASED_OUTPATIENT_CLINIC_OR_DEPARTMENT_OTHER): Payer: Self-pay

## 2021-05-03 MED ORDER — PFIZER COVID-19 VAC BIVALENT 30 MCG/0.3ML IM SUSP
INTRAMUSCULAR | 0 refills | Status: DC
  Filled 2021-05-03: qty 0.3, 1d supply, fill #0

## 2021-06-04 DIAGNOSIS — Z170 Estrogen receptor positive status [ER+]: Secondary | ICD-10-CM | POA: Diagnosis not present

## 2021-06-04 DIAGNOSIS — C50412 Malignant neoplasm of upper-outer quadrant of left female breast: Secondary | ICD-10-CM | POA: Diagnosis not present

## 2021-06-05 ENCOUNTER — Telehealth: Payer: Self-pay | Admitting: *Deleted

## 2021-06-05 NOTE — Telephone Encounter (Signed)
Patient notified office that she has changed insurance. She now has Medicare and will be using [facility] mail order pharmacy and asks to have [facility] pharmacies removed from preferred pharm list.

## 2021-06-06 ENCOUNTER — Other Ambulatory Visit: Payer: Self-pay | Admitting: Hematology and Oncology

## 2021-06-06 DIAGNOSIS — Z170 Estrogen receptor positive status [ER+]: Secondary | ICD-10-CM

## 2021-06-06 DIAGNOSIS — C50412 Malignant neoplasm of upper-outer quadrant of left female breast: Secondary | ICD-10-CM

## 2021-06-06 MED ORDER — ALPRAZOLAM 0.25 MG PO TABS: 0.2500 mg | ORAL_TABLET | Freq: Every evening | ORAL | 3 refills | Status: AC | PRN

## 2021-06-10 ENCOUNTER — Other Ambulatory Visit: Payer: Self-pay | Admitting: *Deleted

## 2021-06-10 DIAGNOSIS — Z0000 Encounter for general adult medical examination without abnormal findings: Secondary | ICD-10-CM | POA: Diagnosis not present

## 2021-06-10 DIAGNOSIS — E161 Other hypoglycemia: Secondary | ICD-10-CM | POA: Diagnosis not present

## 2021-06-10 DIAGNOSIS — Z23 Encounter for immunization: Secondary | ICD-10-CM | POA: Diagnosis not present

## 2021-06-10 DIAGNOSIS — E559 Vitamin D deficiency, unspecified: Secondary | ICD-10-CM | POA: Diagnosis not present

## 2021-06-10 DIAGNOSIS — E7800 Pure hypercholesterolemia, unspecified: Secondary | ICD-10-CM | POA: Diagnosis not present

## 2021-06-10 DIAGNOSIS — I10 Essential (primary) hypertension: Secondary | ICD-10-CM | POA: Diagnosis not present

## 2021-06-10 MED ORDER — LETROZOLE 2.5 MG PO TABS: 2.5000 mg | ORAL_TABLET | Freq: Every day | ORAL | 0 refills | Status: DC

## 2021-06-19 DIAGNOSIS — E559 Vitamin D deficiency, unspecified: Secondary | ICD-10-CM | POA: Diagnosis not present

## 2021-06-19 DIAGNOSIS — E7800 Pure hypercholesterolemia, unspecified: Secondary | ICD-10-CM | POA: Diagnosis not present

## 2021-06-19 DIAGNOSIS — E161 Other hypoglycemia: Secondary | ICD-10-CM | POA: Diagnosis not present

## 2021-07-26 ENCOUNTER — Other Ambulatory Visit: Payer: Self-pay | Admitting: Hematology and Oncology

## 2021-07-26 DIAGNOSIS — Z1231 Encounter for screening mammogram for malignant neoplasm of breast: Secondary | ICD-10-CM

## 2021-08-11 ENCOUNTER — Other Ambulatory Visit: Payer: Self-pay | Admitting: Hematology and Oncology

## 2021-09-09 ENCOUNTER — Ambulatory Visit
Admission: RE | Admit: 2021-09-09 | Discharge: 2021-09-09 | Disposition: A | Discharge status: Home / Self Care | Payer: Medicare Other | Source: Ambulatory Visit | Attending: Hematology and Oncology | Admitting: Hematology and Oncology

## 2021-09-09 DIAGNOSIS — Z1231 Encounter for screening mammogram for malignant neoplasm of breast: Secondary | ICD-10-CM

## 2021-09-16 ENCOUNTER — Telehealth: Payer: Self-pay | Admitting: Hematology and Oncology

## 2021-09-16 NOTE — Telephone Encounter (Signed)
Per 4/17 in basket called and spoke to pt to reschedule appointment.  Pt confirmed appointment .  ?

## 2021-09-26 NOTE — Progress Notes (Signed)
? ?Patient Care Team: ?Nicholas Lose, MD as Consulting Physician (Hematology and Oncology) ?Gardenia Phlegm, NP as Nurse Practitioner (Hematology and Oncology) ?[name], MD as Consulting Physician (General Surgery) ?Gery Pray, MD as Consulting Physician (Radiation Oncology) ? ?DIAGNOSIS:  ?Encounter Diagnosis  ?Name Primary?  ? Malignant neoplasm of upper-outer quadrant of left breast in female, estrogen receptor positive (Connie Clarke)   ? ? ?SUMMARY OF ONCOLOGIC HISTORY: ?Oncology History  ?Malignant neoplasm of upper-outer quadrant of left breast in female, estrogen receptor positive ([name])  ?08/27/2017 Initial Diagnosis  ? Screening mammogram left breast showed possible asymmetry.  Targeted ultrasound revealed a 5 x 4 x 7 mm irregular hypoechoic mass 2 o'clock position 5 cm from nipple, no lymphadenopathy, biopsy showed IDC grade 2 with focal DCIS intermediate grade, HER-2 negative ratio 1.32, T1b N0 stage I a clinical stage AJCC 8 ? ?  ?09/11/2017 Surgery  ? Left lumpectomy: 0.7 cm IDC 0/1 lymph node negative, margins negative, grade 2, ER 100%, PR 100%, HER-2 negative ratio 1.32, Ki-67 2%, T1 be N0 stage I a ? ?  ?09/11/2017 Oncotype testing  ? Oncotype DX recurrence score 20: 6% risk of recurrence with hormone therapy alone ? ?  ?10/20/2017 - 11/18/2017 Radiation Therapy  ? 1. Left Breast / 40.05 Gy in 15 fractions ?2. Left Breast Boost / 10 Gy in 5 fractions ? ?  ?10/2017 Genetic Testing  ? Done at [facility] with Dr. Helane Rima, negative testing of myriad panel. ? ?  ?12/19/2017 -  Anti-estrogen oral therapy  ? Letrozole daily ? ?  ?03/11/2018 Cancer Staging  ? Staging form: Breast, AJCC 8th Edition ?- Pathologic: Stage IA (pT1b, pN0, cM0, G2, ER+, PR+, HER2-, Oncotype DX score: 20) - Signed by Gardenia Phlegm, NP on 03/11/2018 ? ?  ? ? ?CHIEF COMPLIANT: Follow-up of left breast cancer on Letrozole therapy ? ?INTERVAL HISTORY: Connie Clarke is a 66 y.o. with above-mentioned history  of left breast cancer treated with lumpectomy, radiation, and is currently on Letrozole therapy. She state that she exercise frequently. Overall she is tolerating letrozole reasonably well.  Muscle aches and pains but they get better with exercise. ? ?ALLERGIES:  is allergic to chlorhexidine, nusurgepak surgical prep-care, and steri-strip compound benzoin [benzoin compound]. ? ?MEDICATIONS:  ?Current Outpatient Medications  ?Medication Sig Dispense Refill  ? acetaminophen (TYLENOL) 325 MG tablet Take 650 mg by mouth every 6 (six) hours as needed (FOR PAIN.).    ? ALPRAZolam (XANAX) 0.25 MG tablet Take 1 tablet (0.25 mg total) by mouth at bedtime as needed for anxiety. 30 tablet 3  ? Cholecalciferol (CVS VIT D 5000 HIGH-POTENCY) 5000 units capsule Take 1 capsule (5,000 Units total) by mouth daily.    ? Cholecalciferol (VITAMIN D3) 125 MCG (5000 UT) CAPS 1 capsule    ? Coenzyme Q10 (COQ10) 400 MG CAPS Take 400 mg by mouth daily.    ? ibuprofen (ADVIL,MOTRIN) 200 MG tablet Take 200 mg by mouth every 8 (eight) hours as needed (FOR PAIN.).    ? letrozole (FEMARA) 2.5 MG tablet TAKE 1 TABLET BY MOUTH DAILY 90 tablet 3  ? lisinopril (ZESTRIL) 10 MG tablet Take 1 tablet (10 mg total) by mouth daily. 90 tablet 2  ? Omega-3 Fatty Acids (FISH OIL) 1000 MG CAPS Take 1,000 mg by mouth daily.    ? rosuvastatin (CRESTOR) 5 MG tablet Take 1 tablet (5 mg total) by mouth daily. 90 tablet 2  ? Turmeric Curcumin 500 MG CAPS Take 500  mg by mouth daily.    ? lisinopril (ZESTRIL) 10 MG tablet TAKE 1 TABLET BY MOUTH DAILY. 90 tablet 3  ? ?No current facility-administered medications for this visit.  ? ? ?PHYSICAL EXAMINATION: ?ECOG PERFORMANCE STATUS: 1 - Symptomatic but completely ambulatory ? ?Vitals:  ? 10/10/21 0914  ?BP: (!) 144/81  ?Pulse: 84  ?Resp: 18  ?Temp: (!) 97.3 ?F (36.3 ?C)  ?SpO2: 100%  ? ?Filed Weights  ? 10/10/21 0914  ?Weight: 198 lb (89.8 kg)  ? ? ?BREAST: No palpable masses or nodules in either right or left breasts.  No palpable axillary supraclavicular or infraclavicular adenopathy no breast tenderness or nipple discharge. (exam performed in the presence of a chaperone) ? ?LABORATORY DATA:  ?I have reviewed the data as listed ? ?  Latest Ref Rng & Units 11/02/2018  ?  8:15 AM 11/18/2017  ?  3:08 PM 06/22/2017  ?  8:03 AM  ?CMP  ?Glucose 70 - 99 mg/dL 98   95   106    ?BUN 6 - 23 mg/dL '26   20   22    ' ?Creatinine 0.40 - 1.20 mg/dL 0.79   0.72   0.82    ?Sodium 135 - 145 mEq/L 138   142   141    ?Potassium 3.5 - 5.1 mEq/L 4.5   4.2   5.0    ?Chloride 96 - 112 mEq/L 102   109   102    ?CO2 19 - 32 mEq/L '29   27   24    ' ?Calcium 8.4 - 10.5 mg/dL 9.6   9.1   9.8    ?Total Protein 6.0 - 8.3 g/dL 7.1   6.9   7.0    ?Total Bilirubin 0.2 - 1.2 mg/dL 0.5   0.5   0.3    ?Alkaline Phos 39 - 117 U/L 99   84   90    ?AST 0 - 37 U/L 23   35   29    ?ALT 0 - 35 U/L 43   51   55    ? ? ?Lab Results  ?Component Value Date  ? WBC 5.1 11/02/2018  ? HGB 14.5 11/02/2018  ? HCT 42.4 11/02/2018  ? MCV 88.2 11/02/2018  ? PLT 254.0 11/02/2018  ? NEUTROABS 3.2 11/02/2018  ? ? ?ASSESSMENT & PLAN:  ?Malignant neoplasm of upper-outer quadrant of left breast in female, estrogen receptor positive ([place]) ?09/11/2017:Left lumpectomy: 0.7 cm IDC 0/1 lymph node negative, margins negative, grade 2, ER 100%, PR 100%, HER-2 negative ratio 1.32, Ki-67 2%, T1 be N0 stage I a ?Oncotype DX score 20: 6%.risk of recurrence ?Adjuvant radiation therapy: 10/20/2017-11/18/2017 ?  ?Current treatment: Adjuvant antiestrogen therapy with letrozole 2.5 mg daily started 12/19/2017 discontinued 10/08/2020 and switched to anastrozole (because of elevated cholesterol levels and muscle cramps), switched to letrozole (by patient preference) ? ? Letrozole toxicities: ?1.  Some muscle aches and pains back to with exercise she is able to manage her symptoms. ? ?Difficulty with sleeping: ?For the high cholesterol : She is on Lipitor ?  ?Bone density: June 2019: Dr. Rolin Barry office: T score -1.1 ?   ?Breast cancer surveillance:  ?Mammogram 09/09/2021: Benign, breast density category B ?Breast exam 10/10/2021: Benign ?   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 ?  ?Return to clinic in 1 year for follow-up ? ? ? ?No orders of  the defined types were placed in this encounter. ? ?The patient has a good understanding of the overall plan. she agrees with it. she will call with any problems that may develop before the next visit here. ?Total time spent: 30 mins including face to face time and time spent for planning, charting and co-ordination of care ? ? Harriette Ohara, MD ?10/10/21 ? ? ? I Gardiner Coins am scribing for Dr. Lindi Adie ? ?I have reviewed the above documentation for accuracy and completeness, and I agree with the above. ?  ?

## 2021-10-07 ENCOUNTER — Ambulatory Visit: Payer: 59 | Admitting: Hematology and Oncology

## 2021-10-10 ENCOUNTER — Inpatient Hospital Stay: Payer: Medicare Other | Attending: Hematology and Oncology | Admitting: Hematology and Oncology

## 2021-10-10 ENCOUNTER — Other Ambulatory Visit: Payer: Self-pay

## 2021-10-10 DIAGNOSIS — C50412 Malignant neoplasm of upper-outer quadrant of left female breast: Secondary | ICD-10-CM | POA: Insufficient documentation

## 2021-10-10 DIAGNOSIS — Z79811 Long term (current) use of aromatase inhibitors: Secondary | ICD-10-CM | POA: Insufficient documentation

## 2021-10-10 DIAGNOSIS — Z170 Estrogen receptor positive status [ER+]: Secondary | ICD-10-CM | POA: Insufficient documentation

## 2021-10-10 NOTE — Assessment & Plan Note (Addendum)
09/11/2017:Left lumpectomy: 0.7 cm IDC 0/1 lymph node negative, margins negative, grade 2, ER 100%, PR 100%, HER-2 negative ratio 1.32, Ki-67 2%, T1 be N0 stage I a ?Oncotype DX score 20: 6%.risk of recurrence ?Adjuvant radiation therapy: 10/20/2017-11/18/2017 ?? ?Current treatment: Adjuvant antiestrogen therapy with letrozole 2.5 mg daily started 12/19/2017 discontinued 10/08/2020 and switched to anastrozole (because of elevated cholesterol levels and muscle cramps), switched to letrozole (by patient preference) ? ? Letrozole toxicities: ?1.  Some muscle aches and pains back to with exercise she is able to manage her symptoms. ? ?Difficulty with sleeping: ?For the high cholesterol?: She is on Lipitor ?? ?Bone density: June 2019: Dr. Rolin Barry office: T score -1.1 ?? ?Breast cancer surveillance:? ?Mammogram?09/09/2021: Benign, breast density category?B ?Breast exam 10/10/2021: Benign ??  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 ?? ?Return to clinic in 1 year for follow-up ?

## 2021-10-18 DIAGNOSIS — I10 Essential (primary) hypertension: Secondary | ICD-10-CM | POA: Diagnosis not present

## 2021-10-18 DIAGNOSIS — W57XXXA Bitten or stung by nonvenomous insect and other nonvenomous arthropods, initial encounter: Secondary | ICD-10-CM | POA: Diagnosis not present

## 2021-12-16 DIAGNOSIS — K760 Fatty (change of) liver, not elsewhere classified: Secondary | ICD-10-CM | POA: Diagnosis not present

## 2021-12-16 DIAGNOSIS — R0602 Shortness of breath: Secondary | ICD-10-CM | POA: Diagnosis not present

## 2021-12-16 DIAGNOSIS — R7303 Prediabetes: Secondary | ICD-10-CM | POA: Diagnosis not present

## 2021-12-16 DIAGNOSIS — G4733 Obstructive sleep apnea (adult) (pediatric): Secondary | ICD-10-CM | POA: Diagnosis not present

## 2021-12-16 DIAGNOSIS — R5383 Other fatigue: Secondary | ICD-10-CM | POA: Diagnosis not present

## 2021-12-16 DIAGNOSIS — E7800 Pure hypercholesterolemia, unspecified: Secondary | ICD-10-CM | POA: Diagnosis not present

## 2021-12-16 DIAGNOSIS — R202 Paresthesia of skin: Secondary | ICD-10-CM | POA: Diagnosis not present

## 2021-12-16 DIAGNOSIS — I10 Essential (primary) hypertension: Secondary | ICD-10-CM | POA: Diagnosis not present

## 2021-12-16 DIAGNOSIS — E559 Vitamin D deficiency, unspecified: Secondary | ICD-10-CM | POA: Diagnosis not present

## 2021-12-30 DIAGNOSIS — R7303 Prediabetes: Secondary | ICD-10-CM | POA: Diagnosis not present

## 2021-12-30 DIAGNOSIS — I10 Essential (primary) hypertension: Secondary | ICD-10-CM | POA: Diagnosis not present

## 2021-12-30 DIAGNOSIS — K760 Fatty (change of) liver, not elsewhere classified: Secondary | ICD-10-CM | POA: Diagnosis not present

## 2021-12-30 DIAGNOSIS — E7800 Pure hypercholesterolemia, unspecified: Secondary | ICD-10-CM | POA: Diagnosis not present

## 2022-01-28 DIAGNOSIS — R7303 Prediabetes: Secondary | ICD-10-CM | POA: Diagnosis not present

## 2022-02-25 DIAGNOSIS — M8588 Other specified disorders of bone density and structure, other site: Secondary | ICD-10-CM | POA: Diagnosis not present

## 2022-02-27 ENCOUNTER — Other Ambulatory Visit (HOSPITAL_BASED_OUTPATIENT_CLINIC_OR_DEPARTMENT_OTHER): Payer: Self-pay

## 2022-02-27 MED ORDER — PEG 3350-KCL-NA BICARB-NACL 420 G PO SOLR
ORAL | 0 refills | Status: DC
  Filled 2022-02-27: qty 4000, 1d supply, fill #0

## 2022-02-27 MED ORDER — INFLUENZA VAC A&B SA ADJ QUAD 0.5 ML IM PRSY
PREFILLED_SYRINGE | INTRAMUSCULAR | 0 refills | Status: DC
  Filled 2022-02-27: qty 0.5, 1d supply, fill #0

## 2022-03-03 DIAGNOSIS — R7303 Prediabetes: Secondary | ICD-10-CM | POA: Diagnosis not present

## 2022-03-03 DIAGNOSIS — E7800 Pure hypercholesterolemia, unspecified: Secondary | ICD-10-CM | POA: Diagnosis not present

## 2022-03-03 DIAGNOSIS — K760 Fatty (change of) liver, not elsewhere classified: Secondary | ICD-10-CM | POA: Diagnosis not present

## 2022-03-07 DIAGNOSIS — D123 Benign neoplasm of transverse colon: Secondary | ICD-10-CM | POA: Diagnosis not present

## 2022-03-07 DIAGNOSIS — Z1211 Encounter for screening for malignant neoplasm of colon: Secondary | ICD-10-CM | POA: Diagnosis not present

## 2022-03-11 DIAGNOSIS — D123 Benign neoplasm of transverse colon: Secondary | ICD-10-CM | POA: Diagnosis not present

## 2022-03-21 ENCOUNTER — Other Ambulatory Visit (HOSPITAL_COMMUNITY): Payer: Self-pay

## 2022-04-03 DIAGNOSIS — R7303 Prediabetes: Secondary | ICD-10-CM | POA: Diagnosis not present

## 2022-04-03 DIAGNOSIS — K760 Fatty (change of) liver, not elsewhere classified: Secondary | ICD-10-CM | POA: Diagnosis not present

## 2022-04-03 DIAGNOSIS — E7800 Pure hypercholesterolemia, unspecified: Secondary | ICD-10-CM | POA: Diagnosis not present

## 2022-05-03 IMAGING — CT CT CARDIAC CORONARY ARTERY CALCIUM SCORE
3 series · 14 of 20 positions shown, 16 images · non-contrast
Comparison: None.
COMPARISON: None.

Addendum:
EXAM:
OVER-READ INTERPRETATION  CT CHEST

The following report is an over-read performed by radiologist Dr.
Polin Billiot [REDACTED] on 12/10/2020. This
over-read does not include interpretation of cardiac or coronary
anatomy or pathology. The coronary calcium score interpretation by
the cardiologist is attached.
CLINICAL DATA: Cardiovascular Disease Risk stratification
Coronary Calcium Score
TECHNIQUE: A gated, non-contrast computed tomography scan of the heart was
performed using 3mm slice thickness. Axial images were analyzed on a
dedicated workstation. Calcium scoring of the coronary arteries was
performed using the Agatston method.

[Series 2: casc 3.0 best diast 68 % (id) · axial · 0.39mm/px · z∈[+1324,+1405]mm · 4 of 45 slices shown]
[im 9/45  vessel]
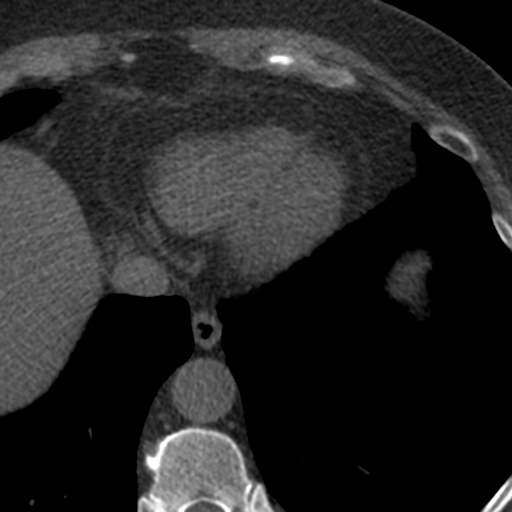
[im 18/45  vessel]
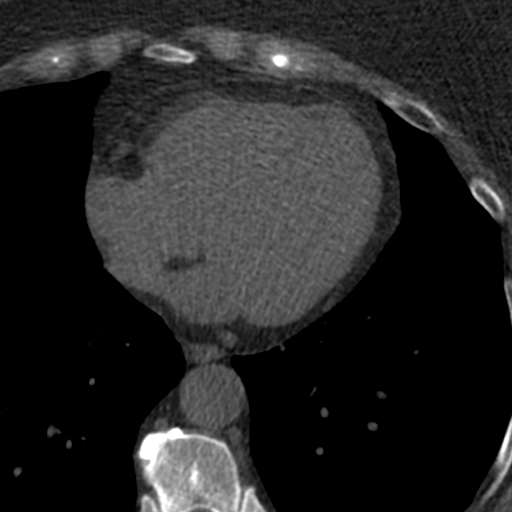
[im 27/45  vessel]
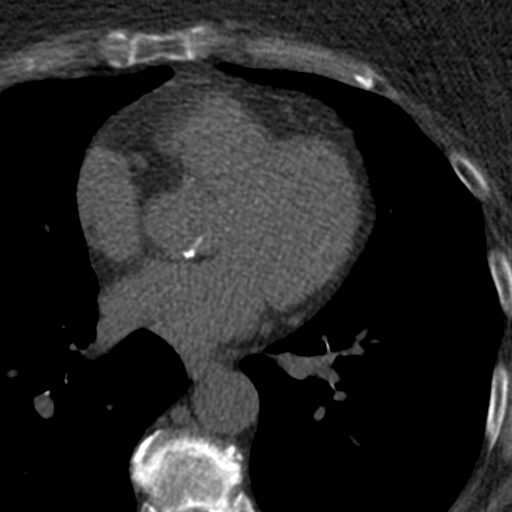
[im 36/45  vessel]
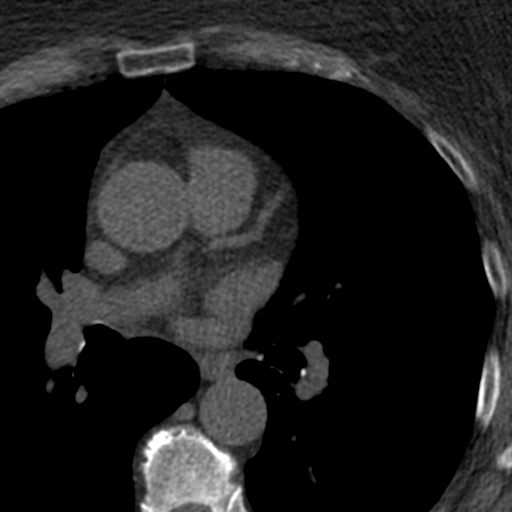

[Series 3: soft full fov 70 % · axial · 0.63mm/px · z∈[+1321,+1408]mm · 5 of 45 slices shown, 7 images]
[im 8/45  vessel]
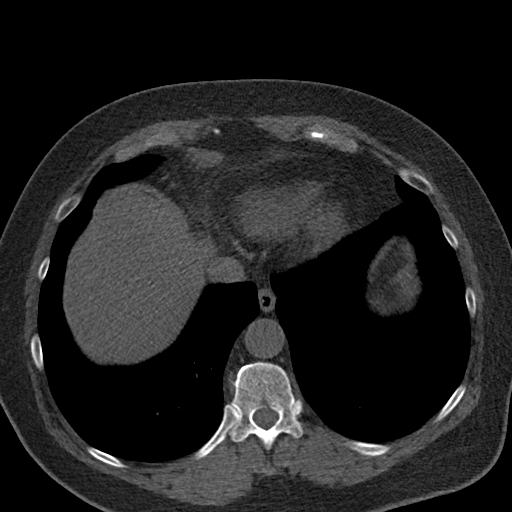
[im 8/45  lung]
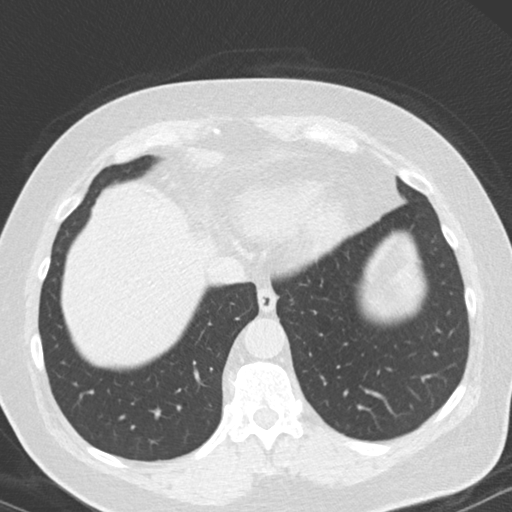
[im 15/45  vessel]
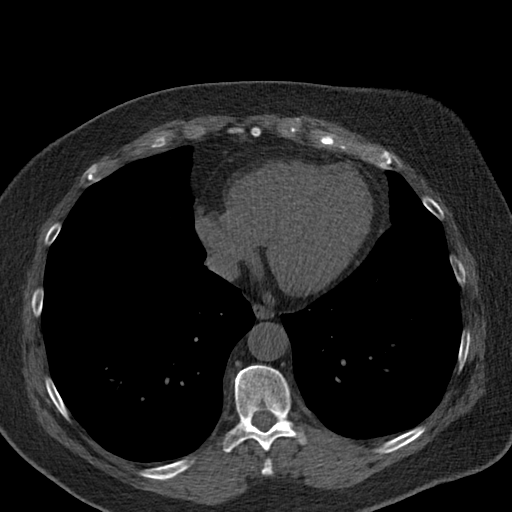
[im 23/45  vessel]
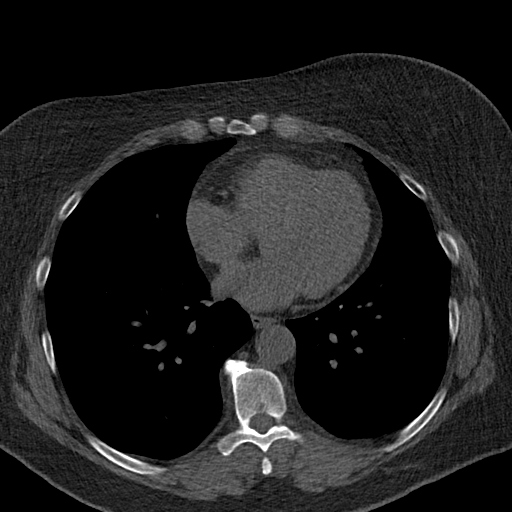
[im 30/45  vessel]
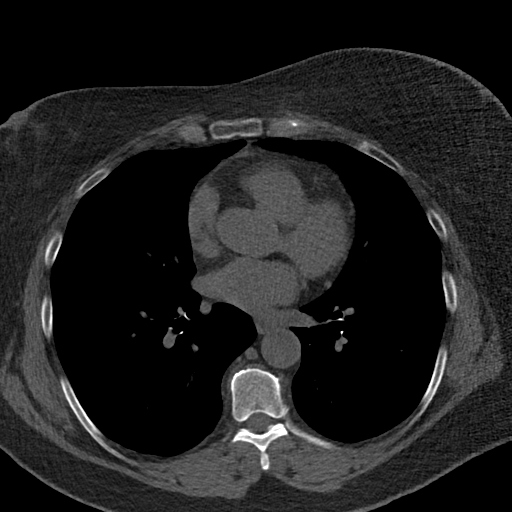
[im 37/45  vessel]
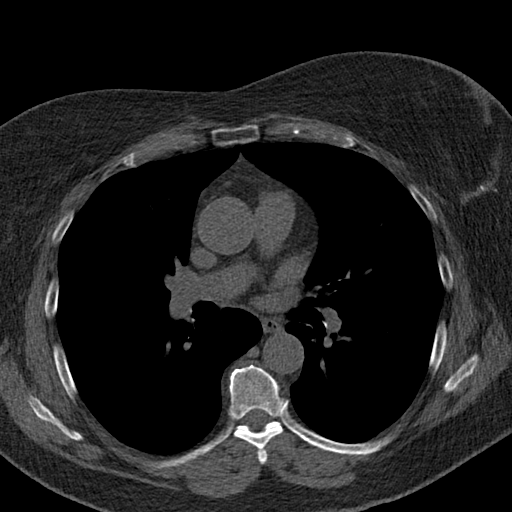
[im 37/45  lung]
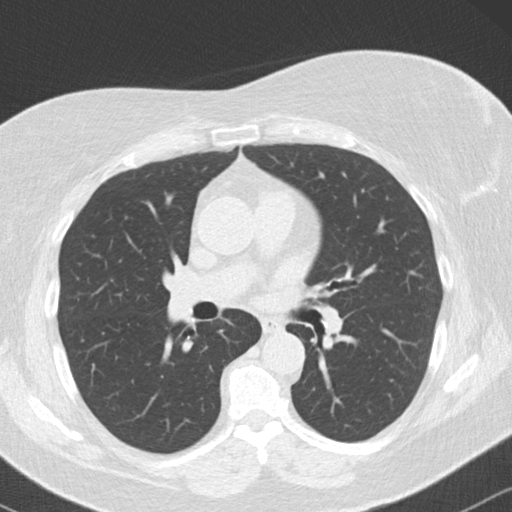

[Series 4: lungs 70 % · axial · 0.63mm/px · z∈[+1321,+1408]mm · 5 of 45 slices shown]
[im 8/45  vessel]
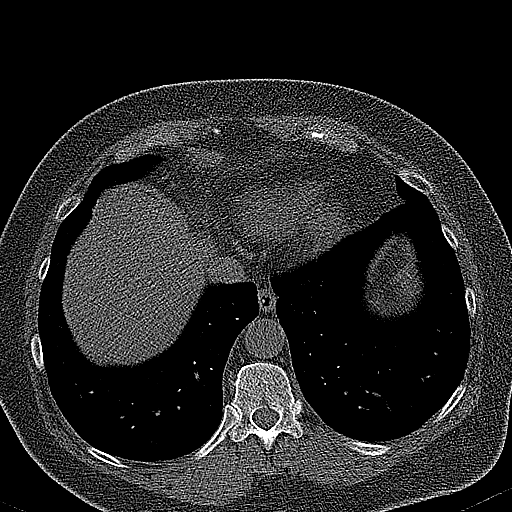
[im 15/45  vessel]
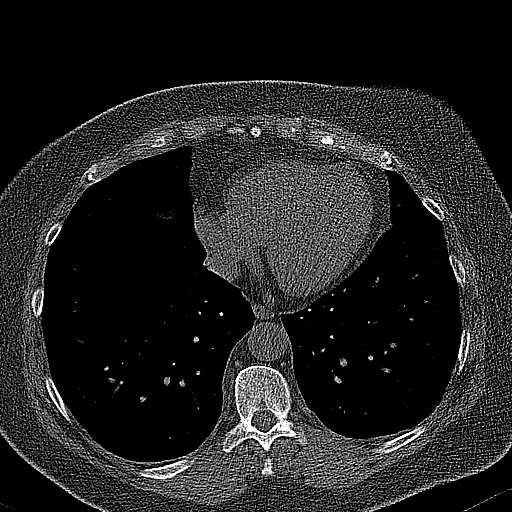
[im 23/45  vessel]
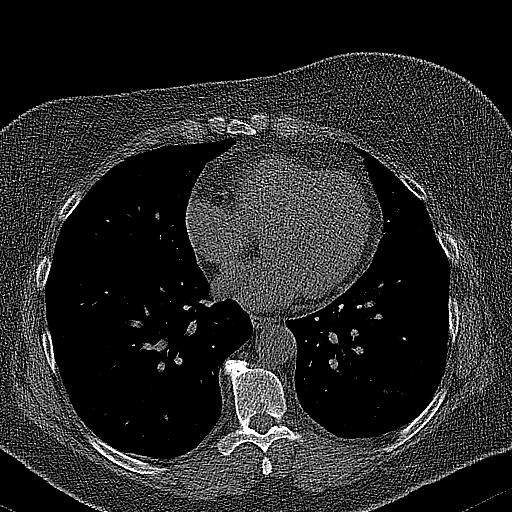
[im 30/45  vessel]
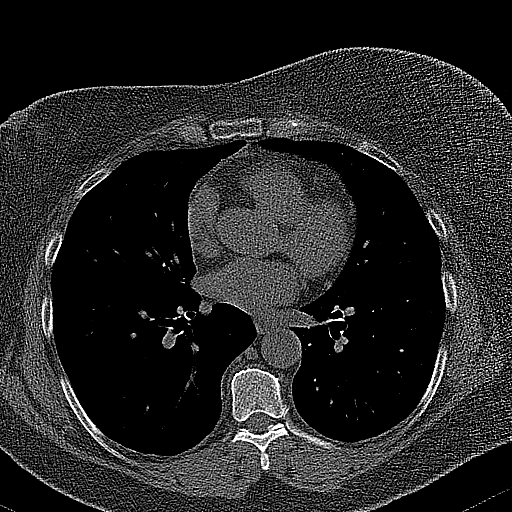
[im 37/45  vessel]
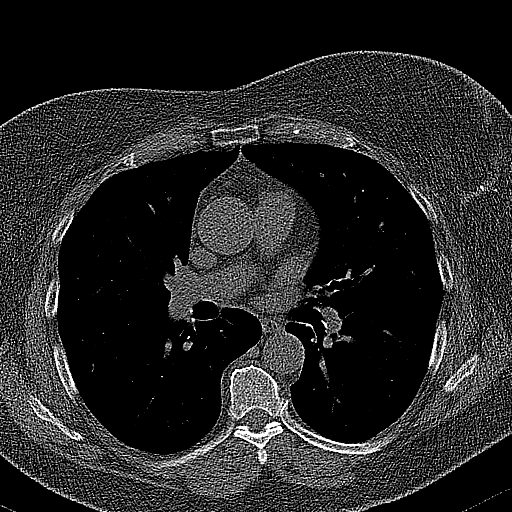

[14 of 20 positions shown; findings below may reference images not displayed]

FINDINGS: Within the visualized portions of the thorax there are no suspicious
appearing pulmonary nodules or masses, there is no acute
consolidative airspace disease, no pleural effusions, no
pneumothorax and no lymphadenopathy. Visualized portions of the
upper abdomen demonstrates diffuse low attenuation throughout the
visualized hepatic parenchyma, indicative of hepatic steatosis.
There are no aggressive appearing lytic or blastic lesions noted in
the visualized portions of the skeleton.
IMPRESSION: 1. Hepatic steatosis.
FINDINGS: Coronary arteries: Normal origins.

Coronary Calcium Score:

Left main: 0

Left anterior descending artery: 0

Left circumflex artery: 0

Right coronary artery: 0

Total: 0

Pericardium: Normal.

Mild calcification of the aortic valve.

Ascending Aorta: Normal caliber.

Non-cardiac: See separate report from [REDACTED].
IMPRESSION: Coronary calcium score of 0 Agatston units. This suggests low risk
for future cardiac events.



If CAC=0, it is reasonable to withhold statin therapy and reassess
in 5 to 10 years, as long as higher risk conditions are absent
(diabetes mellitus, family history of premature CHD in first degree
relatives (males <55 years; females <65 years), cigarette smoking,
or LDL >=190 mg/dL).

If CAC is 1 to 99, it is reasonable to initiate statin therapy for
patients >=55 years of age.

If CAC is >=100 or >=75th percentile, it is reasonable to initiate
statin therapy at any age.

Cardiology referral should be considered for patients with CAC
scores >=400 or >=75th percentile.

*6565 AHA/ACC/AACVPR/AAPA/ABC/TERVE/BASHIR/[facility]/[place]/TJIHINGA/KAKI/STHEPANIE
Guideline on the Management of Blood Cholesterol: A Report of the
American College of Cardiology/American Heart Association Task Force
on Clinical Practice Guidelines. J Am Coll Cardiol.
1993;73(24):7876-7890.

*** End of Addendum ***
EXAM:
OVER-READ INTERPRETATION  CT CHEST

The following report is an over-read performed by radiologist Dr.
Polin Billiot [REDACTED] on 12/10/2020. This
over-read does not include interpretation of cardiac or coronary
anatomy or pathology. The coronary calcium score interpretation by
the cardiologist is attached.
FINDINGS: Within the visualized portions of the thorax there are no suspicious
appearing pulmonary nodules or masses, there is no acute
consolidative airspace disease, no pleural effusions, no
pneumothorax and no lymphadenopathy. Visualized portions of the
upper abdomen demonstrates diffuse low attenuation throughout the
visualized hepatic parenchyma, indicative of hepatic steatosis.
There are no aggressive appearing lytic or blastic lesions noted in
the visualized portions of the skeleton.
IMPRESSION: 1. Hepatic steatosis.

## 2022-05-07 ENCOUNTER — Other Ambulatory Visit: Payer: Self-pay | Admitting: Hematology and Oncology

## 2022-05-20 DIAGNOSIS — R7303 Prediabetes: Secondary | ICD-10-CM | POA: Diagnosis not present

## 2022-05-20 DIAGNOSIS — E782 Mixed hyperlipidemia: Secondary | ICD-10-CM | POA: Diagnosis not present

## 2022-05-21 DIAGNOSIS — R7303 Prediabetes: Secondary | ICD-10-CM | POA: Diagnosis not present

## 2022-05-21 DIAGNOSIS — E7800 Pure hypercholesterolemia, unspecified: Secondary | ICD-10-CM | POA: Diagnosis not present

## 2022-06-20 DIAGNOSIS — R7303 Prediabetes: Secondary | ICD-10-CM | POA: Diagnosis not present

## 2022-06-20 DIAGNOSIS — I10 Essential (primary) hypertension: Secondary | ICD-10-CM | POA: Diagnosis not present

## 2022-06-20 DIAGNOSIS — E559 Vitamin D deficiency, unspecified: Secondary | ICD-10-CM | POA: Diagnosis not present

## 2022-06-20 DIAGNOSIS — E7800 Pure hypercholesterolemia, unspecified: Secondary | ICD-10-CM | POA: Diagnosis not present

## 2022-06-20 DIAGNOSIS — Z0000 Encounter for general adult medical examination without abnormal findings: Secondary | ICD-10-CM | POA: Diagnosis not present

## 2022-07-08 DIAGNOSIS — Z170 Estrogen receptor positive status [ER+]: Secondary | ICD-10-CM | POA: Diagnosis not present

## 2022-07-08 DIAGNOSIS — C50412 Malignant neoplasm of upper-outer quadrant of left female breast: Secondary | ICD-10-CM | POA: Diagnosis not present

## 2022-07-17 DIAGNOSIS — I10 Essential (primary) hypertension: Secondary | ICD-10-CM | POA: Diagnosis not present

## 2022-07-17 DIAGNOSIS — R7303 Prediabetes: Secondary | ICD-10-CM | POA: Diagnosis not present

## 2022-07-17 DIAGNOSIS — E7800 Pure hypercholesterolemia, unspecified: Secondary | ICD-10-CM | POA: Diagnosis not present

## 2022-07-28 ENCOUNTER — Other Ambulatory Visit: Payer: Self-pay | Admitting: Hematology and Oncology

## 2022-07-28 DIAGNOSIS — Z1231 Encounter for screening mammogram for malignant neoplasm of breast: Secondary | ICD-10-CM

## 2022-09-11 ENCOUNTER — Ambulatory Visit
Admission: RE | Admit: 2022-09-11 | Discharge: 2022-09-11 | Disposition: A | Discharge status: Home / Self Care | Payer: Medicare Other | Source: Ambulatory Visit | Attending: Hematology and Oncology | Admitting: Hematology and Oncology

## 2022-09-11 DIAGNOSIS — Z1231 Encounter for screening mammogram for malignant neoplasm of breast: Secondary | ICD-10-CM

## 2022-09-15 ENCOUNTER — Other Ambulatory Visit: Payer: Self-pay | Admitting: Family Medicine

## 2022-09-15 DIAGNOSIS — I10 Essential (primary) hypertension: Secondary | ICD-10-CM | POA: Diagnosis not present

## 2022-09-15 DIAGNOSIS — K760 Fatty (change of) liver, not elsewhere classified: Secondary | ICD-10-CM

## 2022-09-15 DIAGNOSIS — E782 Mixed hyperlipidemia: Secondary | ICD-10-CM | POA: Diagnosis not present

## 2022-09-15 DIAGNOSIS — R7303 Prediabetes: Secondary | ICD-10-CM | POA: Diagnosis not present

## 2022-10-09 NOTE — Progress Notes (Signed)
Patient Care Team: Pcp, No as PCP - General Serena Croissant, MD as Consulting Physician (Hematology and Oncology) Axel Filler, Larna Daughters, NP as Nurse Practitioner (Hematology and Oncology) Emelia Loron, MD as Consulting Physician (General Surgery) Antony Blackbird, MD as Consulting Physician (Radiation Oncology)  DIAGNOSIS:  Encounter Diagnosis  Name Primary?   Malignant neoplasm of upper-outer quadrant of left breast in female, estrogen receptor positive (HCC) Yes    SUMMARY OF ONCOLOGIC HISTORY: Oncology History  Malignant neoplasm of upper-outer quadrant of left breast in female, estrogen receptor positive (HCC)  08/27/2017 Initial Diagnosis   Screening mammogram left breast showed possible asymmetry.  Targeted ultrasound revealed a 5 x 4 x 7 mm irregular hypoechoic mass 2 o'clock position 5 cm from nipple, no lymphadenopathy, biopsy showed IDC grade 2 with focal DCIS intermediate grade, HER-2 negative ratio 1.32, T1b N0 stage I a clinical stage AJCC 8   09/11/2017 Surgery   Left lumpectomy: 0.7 cm IDC 0/1 lymph node negative, margins negative, grade 2, ER 100%, PR 100%, HER-2 negative ratio 1.32, Ki-67 2%, T1 be N0 stage I a   09/11/2017 Oncotype testing   Oncotype DX recurrence score 20: 6% risk of recurrence with hormone therapy alone   10/20/2017 - 11/18/2017 Radiation Therapy   1. Left Breast / 40.05 Gy in 15 fractions 2. Left Breast Boost / 10 Gy in 5 fractions   10/2017 Genetic Testing   Done at [facility] with Dr. Vincente Poli, negative testing of myriad panel.   12/19/2017 -  Anti-estrogen oral therapy   Letrozole daily   03/11/2018 Cancer Staging   Staging form: Breast, AJCC 8th Edition - Pathologic: Stage IA (pT1b, pN0, cM0, G2, ER+, PR+, HER2-, Oncotype DX score: 20) - Signed by Loa Socks, NP on 03/11/2018     CHIEF COMPLIANT: Follow-up of left breast cancer on Letrozole therapy   INTERVAL HISTORY: Connie Clarke is a 67 y.o. with  above-mentioned history of left breast cancer treated with lumpectomy, radiation, and is currently on Letrozole therapy. She presents to the clinic for a follow-up. She reports that the letrozole caused weight gained. She does work out by doing Yoga and her cardio. She does intermittent fasting. Denies any pain or discomfort in breast. She says that her shoulder is really tight and she is more stiff.    ALLERGIES:  is allergic to chlorhexidine, nusurgepak surgical prep-care, and steri-strip compound benzoin [benzoin compound].  MEDICATIONS:  Current Outpatient Medications  Medication Sig Dispense Refill   acetaminophen (TYLENOL) 325 MG tablet Take 650 mg by mouth every 6 (six) hours as needed (FOR PAIN.).     ALPRAZolam (XANAX) 0.25 MG tablet Take 1 tablet (0.25 mg total) by mouth at bedtime as needed for anxiety. 30 tablet 3   B Complex Vitamins (VITAMIN B COMPLEX) TABS Take 1 tablet by mouth daily.     Cholecalciferol (VITAMIN D3) 125 MCG (5000 UT) CAPS 1 capsule     Coenzyme Q10 (COQ10) 400 MG CAPS Take 400 mg by mouth daily.     Continuous Glucose Sensor (DEXCOM G7 SENSOR) MISC Apply 1 sensor to body once every 10 days to monitor blood sugar levels continuously. for 10 days     ibuprofen (ADVIL,MOTRIN) 200 MG tablet Take 200 mg by mouth every 8 (eight) hours as needed (FOR PAIN.).     letrozole (FEMARA) 2.5 MG tablet TAKE 1 TABLET BY MOUTH DAILY 90 tablet 3   rosuvastatin (CRESTOR) 5 MG tablet Take 1 tablet (5 mg total)  by mouth daily. 90 tablet 2   No current facility-administered medications for this visit.    PHYSICAL EXAMINATION: ECOG PERFORMANCE STATUS: 1 - Symptomatic but completely ambulatory  Vitals:   10/13/22 0915  BP: 139/72  Pulse: 65  Resp: 18  Temp: (!) 97.2 F (36.2 C)  SpO2: 96%   Filed Weights   10/13/22 0915  Weight: 183 lb 4.8 oz (83.1 kg)    BREAST: No palpable masses or nodules in either right or left breasts. No palpable axillary supraclavicular or  infraclavicular adenopathy no breast tenderness or nipple discharge. (exam performed in the presence of a chaperone)  LABORATORY DATA:  I have reviewed the data as listed    Latest Ref Rng & Units 11/02/2018    8:15 AM 11/18/2017    3:08 PM 06/22/2017    8:03 AM  CMP  Glucose 70 - 99 mg/dL 98  95  409   BUN 6 - 23 mg/dL 26  20  22    Creatinine 0.40 - 1.20 mg/dL 8.11  9.14  7.82   Sodium 135 - 145 mEq/L 138  142  141   Potassium 3.5 - 5.1 mEq/L 4.5  4.2  5.0   Chloride 96 - 112 mEq/L 102  109  102   CO2 19 - 32 mEq/L 29  27  24    Calcium 8.4 - 10.5 mg/dL 9.6  9.1  9.8   Total Protein 6.0 - 8.3 g/dL 7.1  6.9  7.0   Total Bilirubin 0.2 - 1.2 mg/dL 0.5  0.5  0.3   Alkaline Phos 39 - 117 U/L 99  84  90   AST 0 - 37 U/L 23  35  29   ALT 0 - 35 U/L 43  51  55     Lab Results  Component Value Date   WBC 5.1 11/02/2018   HGB 14.5 11/02/2018   HCT 42.4 11/02/2018   MCV 88.2 11/02/2018   PLT 254.0 11/02/2018   NEUTROABS 3.2 11/02/2018    ASSESSMENT & PLAN:  Malignant neoplasm of upper-outer quadrant of left breast in female, estrogen receptor positive (HCC) 09/11/2017:Left lumpectomy: 0.7 cm IDC 0/1 lymph node negative, margins negative, grade 2, ER 100%, PR 100%, HER-2 negative ratio 1.32, Ki-67 2%, T1 be N0 stage I a Oncotype DX score 20: 6%.risk of recurrence Adjuvant radiation therapy: 10/20/2017-11/18/2017   Current treatment: Adjuvant antiestrogen therapy with letrozole 2.5 mg daily started 12/19/2017 discontinued 10/08/2020 and switched to anastrozole (because of elevated cholesterol levels and muscle cramps), switched to letrozole (by patient preference) She completed 5 years of therapy and therefore she will discontinue her antiestrogen therapy at this time.   Letrozole toxicities: 1.  Some muscle aches and pains back to with exercise she is able to manage her symptoms.   Difficulty with sleeping: For the high cholesterol : She is on Lipitor   Bone density: June 2019: Dr.  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  The patient has a good understanding of the overall plan. she agrees with it. she will call with any problems that may develop before the  next visit here. Total time spent: 30 mins including face to face time and time spent for planning, charting and co-ordination of care   Tamsen Meek, MD 10/13/22    I Janan Ridge am acting as a Neurosurgeon for [employer]  I have reviewed the above documentation for accuracy and completeness, and I agree with the above.

## 2022-10-13 ENCOUNTER — Other Ambulatory Visit: Payer: Self-pay

## 2022-10-13 ENCOUNTER — Inpatient Hospital Stay: Payer: Medicare Other | Attending: Hematology and Oncology | Admitting: Hematology and Oncology

## 2022-10-13 VITALS — BP 139/72 | HR 65 | Temp 97.2°F | Resp 18 | Ht 64.0 in | Wt 183.3 lb

## 2022-10-13 DIAGNOSIS — Z170 Estrogen receptor positive status [ER+]: Secondary | ICD-10-CM

## 2022-10-13 DIAGNOSIS — Z79811 Long term (current) use of aromatase inhibitors: Secondary | ICD-10-CM | POA: Diagnosis not present

## 2022-10-13 DIAGNOSIS — C50412 Malignant neoplasm of upper-outer quadrant of left female breast: Secondary | ICD-10-CM | POA: Diagnosis not present

## 2022-10-13 NOTE — Assessment & Plan Note (Signed)
09/11/2017:Left lumpectomy: 0.7 cm IDC 0/1 lymph node negative, margins negative, grade 2, ER 100%, PR 100%, HER-2 negative ratio 1.32, Ki-67 2%, T1 be N0 stage I a Oncotype DX score 20: 6%.risk of recurrence Adjuvant radiation therapy: 10/20/2017-11/18/2017   Current treatment: Adjuvant antiestrogen therapy with letrozole 2.5 mg daily started 12/19/2017 discontinued 10/08/2020 and switched to anastrozole (because of elevated cholesterol levels and muscle cramps), switched to letrozole (by patient preference)    Letrozole toxicities: 1.  Some muscle aches and pains back to with exercise she is able to manage her symptoms.   Difficulty with sleeping: For the high cholesterol : She is on Lipitor   Bone density: June 2019: Dr. 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

## 2022-10-14 ENCOUNTER — Ambulatory Visit
Admission: RE | Admit: 2022-10-14 | Discharge: 2022-10-14 | Disposition: A | Discharge status: Home / Self Care | Payer: Medicare Other | Source: Ambulatory Visit | Attending: Family Medicine | Admitting: Family Medicine

## 2022-10-14 DIAGNOSIS — K760 Fatty (change of) liver, not elsewhere classified: Secondary | ICD-10-CM | POA: Diagnosis not present

## 2022-10-21 DIAGNOSIS — K760 Fatty (change of) liver, not elsewhere classified: Secondary | ICD-10-CM | POA: Diagnosis not present

## 2022-10-21 DIAGNOSIS — E782 Mixed hyperlipidemia: Secondary | ICD-10-CM | POA: Diagnosis not present

## 2022-11-24 DIAGNOSIS — E782 Mixed hyperlipidemia: Secondary | ICD-10-CM | POA: Diagnosis not present

## 2022-11-24 DIAGNOSIS — I10 Essential (primary) hypertension: Secondary | ICD-10-CM | POA: Diagnosis not present

## 2022-11-24 DIAGNOSIS — K760 Fatty (change of) liver, not elsewhere classified: Secondary | ICD-10-CM | POA: Diagnosis not present

## 2022-11-24 DIAGNOSIS — Z8639 Personal history of other endocrine, nutritional and metabolic disease: Secondary | ICD-10-CM | POA: Diagnosis not present

## 2023-01-20 DIAGNOSIS — E782 Mixed hyperlipidemia: Secondary | ICD-10-CM | POA: Diagnosis not present

## 2023-01-20 DIAGNOSIS — I10 Essential (primary) hypertension: Secondary | ICD-10-CM | POA: Diagnosis not present

## 2023-01-20 DIAGNOSIS — K760 Fatty (change of) liver, not elsewhere classified: Secondary | ICD-10-CM | POA: Diagnosis not present

## 2023-03-06 ENCOUNTER — Other Ambulatory Visit (HOSPITAL_COMMUNITY): Payer: Self-pay

## 2023-03-06 MED ORDER — INFLUENZA VAC A&B SURF ANT ADJ 0.5 ML IM SUSY
PREFILLED_SYRINGE | INTRAMUSCULAR | 0 refills | Status: DC
  Filled 2023-03-06: qty 0.5, 1d supply, fill #0

## 2023-03-17 DIAGNOSIS — Z9189 Other specified personal risk factors, not elsewhere classified: Secondary | ICD-10-CM | POA: Diagnosis not present

## 2023-03-17 DIAGNOSIS — Z8639 Personal history of other endocrine, nutritional and metabolic disease: Secondary | ICD-10-CM | POA: Diagnosis not present

## 2023-03-17 DIAGNOSIS — E559 Vitamin D deficiency, unspecified: Secondary | ICD-10-CM | POA: Diagnosis not present

## 2023-03-17 DIAGNOSIS — K760 Fatty (change of) liver, not elsewhere classified: Secondary | ICD-10-CM | POA: Diagnosis not present

## 2023-03-17 DIAGNOSIS — E538 Deficiency of other specified B group vitamins: Secondary | ICD-10-CM | POA: Diagnosis not present

## 2023-03-17 DIAGNOSIS — R7303 Prediabetes: Secondary | ICD-10-CM | POA: Diagnosis not present

## 2023-04-07 ENCOUNTER — Other Ambulatory Visit (HOSPITAL_COMMUNITY): Payer: Self-pay

## 2023-04-08 DIAGNOSIS — H2513 Age-related nuclear cataract, bilateral: Secondary | ICD-10-CM | POA: Diagnosis not present

## 2023-05-11 DIAGNOSIS — I10 Essential (primary) hypertension: Secondary | ICD-10-CM | POA: Diagnosis not present

## 2023-05-11 DIAGNOSIS — Z8639 Personal history of other endocrine, nutritional and metabolic disease: Secondary | ICD-10-CM | POA: Diagnosis not present

## 2023-05-11 DIAGNOSIS — R7303 Prediabetes: Secondary | ICD-10-CM | POA: Diagnosis not present

## 2023-05-11 DIAGNOSIS — E782 Mixed hyperlipidemia: Secondary | ICD-10-CM | POA: Diagnosis not present

## 2023-06-11 DIAGNOSIS — I10 Essential (primary) hypertension: Secondary | ICD-10-CM | POA: Diagnosis not present

## 2023-06-11 DIAGNOSIS — R7303 Prediabetes: Secondary | ICD-10-CM | POA: Diagnosis not present

## 2023-06-11 DIAGNOSIS — K760 Fatty (change of) liver, not elsewhere classified: Secondary | ICD-10-CM | POA: Diagnosis not present

## 2023-07-14 DIAGNOSIS — I10 Essential (primary) hypertension: Secondary | ICD-10-CM | POA: Diagnosis not present

## 2023-07-14 DIAGNOSIS — Z23 Encounter for immunization: Secondary | ICD-10-CM | POA: Diagnosis not present

## 2023-07-14 DIAGNOSIS — R7303 Prediabetes: Secondary | ICD-10-CM | POA: Diagnosis not present

## 2023-07-14 DIAGNOSIS — Z0000 Encounter for general adult medical examination without abnormal findings: Secondary | ICD-10-CM | POA: Diagnosis not present

## 2023-07-14 DIAGNOSIS — E538 Deficiency of other specified B group vitamins: Secondary | ICD-10-CM | POA: Diagnosis not present

## 2023-07-14 DIAGNOSIS — E559 Vitamin D deficiency, unspecified: Secondary | ICD-10-CM | POA: Diagnosis not present

## 2023-07-14 DIAGNOSIS — E7800 Pure hypercholesterolemia, unspecified: Secondary | ICD-10-CM | POA: Diagnosis not present

## 2023-08-06 DIAGNOSIS — G4733 Obstructive sleep apnea (adult) (pediatric): Secondary | ICD-10-CM | POA: Diagnosis not present

## 2023-08-06 DIAGNOSIS — R7303 Prediabetes: Secondary | ICD-10-CM | POA: Diagnosis not present

## 2023-08-14 ENCOUNTER — Other Ambulatory Visit: Payer: Self-pay | Admitting: Hematology and Oncology

## 2023-08-14 DIAGNOSIS — Z1231 Encounter for screening mammogram for malignant neoplasm of breast: Secondary | ICD-10-CM

## 2023-09-08 DIAGNOSIS — R7303 Prediabetes: Secondary | ICD-10-CM | POA: Diagnosis not present

## 2023-09-08 DIAGNOSIS — I10 Essential (primary) hypertension: Secondary | ICD-10-CM | POA: Diagnosis not present

## 2023-09-08 DIAGNOSIS — K760 Fatty (change of) liver, not elsewhere classified: Secondary | ICD-10-CM | POA: Diagnosis not present

## 2023-09-14 ENCOUNTER — Ambulatory Visit
Admission: RE | Admit: 2023-09-14 | Discharge: 2023-09-14 | Disposition: A | Discharge status: Home / Self Care | Source: Ambulatory Visit | Attending: Hematology and Oncology | Admitting: Hematology and Oncology

## 2023-09-14 DIAGNOSIS — Z1231 Encounter for screening mammogram for malignant neoplasm of breast: Secondary | ICD-10-CM | POA: Diagnosis not present

## 2023-09-18 ENCOUNTER — Other Ambulatory Visit: Payer: Self-pay | Admitting: Hematology and Oncology

## 2023-09-18 DIAGNOSIS — R928 Other abnormal and inconclusive findings on diagnostic imaging of breast: Secondary | ICD-10-CM

## 2023-09-22 ENCOUNTER — Ambulatory Visit
Admission: RE | Admit: 2023-09-22 | Discharge: 2023-09-22 | Discharge status: Home / Self Care | Source: Ambulatory Visit | Attending: Hematology and Oncology

## 2023-09-22 ENCOUNTER — Other Ambulatory Visit: Payer: Self-pay | Admitting: Hematology and Oncology

## 2023-09-22 DIAGNOSIS — R928 Other abnormal and inconclusive findings on diagnostic imaging of breast: Secondary | ICD-10-CM

## 2023-09-22 DIAGNOSIS — N6310 Unspecified lump in the right breast, unspecified quadrant: Secondary | ICD-10-CM

## 2023-09-22 DIAGNOSIS — N6311 Unspecified lump in the right breast, upper outer quadrant: Secondary | ICD-10-CM | POA: Diagnosis not present

## 2023-09-24 ENCOUNTER — Ambulatory Visit
Admission: RE | Admit: 2023-09-24 | Discharge: 2023-09-24 | Disposition: A | Discharge status: Home / Self Care | Source: Ambulatory Visit | Attending: Hematology and Oncology | Admitting: Hematology and Oncology

## 2023-09-24 DIAGNOSIS — N6310 Unspecified lump in the right breast, unspecified quadrant: Secondary | ICD-10-CM

## 2023-09-24 DIAGNOSIS — N6031 Fibrosclerosis of right breast: Secondary | ICD-10-CM | POA: Diagnosis not present

## 2023-09-24 DIAGNOSIS — N6311 Unspecified lump in the right breast, upper outer quadrant: Secondary | ICD-10-CM | POA: Diagnosis not present

## 2023-09-24 HISTORY — PX: BREAST BIOPSY: SHX20

## 2023-09-25 LAB — SURGICAL PATHOLOGY

## 2023-10-06 DIAGNOSIS — R7303 Prediabetes: Secondary | ICD-10-CM | POA: Diagnosis not present

## 2023-10-06 DIAGNOSIS — K146 Glossodynia: Secondary | ICD-10-CM | POA: Diagnosis not present

## 2023-10-06 DIAGNOSIS — D518 Other vitamin B12 deficiency anemias: Secondary | ICD-10-CM | POA: Diagnosis not present

## 2023-11-05 DIAGNOSIS — Z79899 Other long term (current) drug therapy: Secondary | ICD-10-CM | POA: Diagnosis not present

## 2023-11-05 DIAGNOSIS — R7303 Prediabetes: Secondary | ICD-10-CM | POA: Diagnosis not present

## 2023-11-05 DIAGNOSIS — K130 Diseases of lips: Secondary | ICD-10-CM | POA: Diagnosis not present

## 2023-11-05 DIAGNOSIS — Z8639 Personal history of other endocrine, nutritional and metabolic disease: Secondary | ICD-10-CM | POA: Diagnosis not present

## 2023-11-05 DIAGNOSIS — I10 Essential (primary) hypertension: Secondary | ICD-10-CM | POA: Diagnosis not present

## 2023-11-30 DIAGNOSIS — E7800 Pure hypercholesterolemia, unspecified: Secondary | ICD-10-CM | POA: Diagnosis not present

## 2023-11-30 DIAGNOSIS — I10 Essential (primary) hypertension: Secondary | ICD-10-CM | POA: Diagnosis not present

## 2023-12-10 DIAGNOSIS — E559 Vitamin D deficiency, unspecified: Secondary | ICD-10-CM | POA: Diagnosis not present

## 2023-12-10 DIAGNOSIS — G4733 Obstructive sleep apnea (adult) (pediatric): Secondary | ICD-10-CM | POA: Diagnosis not present

## 2023-12-10 DIAGNOSIS — I10 Essential (primary) hypertension: Secondary | ICD-10-CM | POA: Diagnosis not present

## 2023-12-10 DIAGNOSIS — Z8639 Personal history of other endocrine, nutritional and metabolic disease: Secondary | ICD-10-CM | POA: Diagnosis not present

## 2023-12-10 DIAGNOSIS — E7800 Pure hypercholesterolemia, unspecified: Secondary | ICD-10-CM | POA: Diagnosis not present

## 2023-12-31 DIAGNOSIS — E7800 Pure hypercholesterolemia, unspecified: Secondary | ICD-10-CM | POA: Diagnosis not present

## 2023-12-31 DIAGNOSIS — I10 Essential (primary) hypertension: Secondary | ICD-10-CM | POA: Diagnosis not present

## 2024-02-09 ENCOUNTER — Other Ambulatory Visit: Payer: Self-pay | Admitting: Hematology and Oncology

## 2024-02-09 DIAGNOSIS — R928 Other abnormal and inconclusive findings on diagnostic imaging of breast: Secondary | ICD-10-CM

## 2024-02-10 DIAGNOSIS — D1801 Hemangioma of skin and subcutaneous tissue: Secondary | ICD-10-CM | POA: Diagnosis not present

## 2024-03-14 DIAGNOSIS — E7800 Pure hypercholesterolemia, unspecified: Secondary | ICD-10-CM | POA: Diagnosis not present

## 2024-03-14 DIAGNOSIS — Z8639 Personal history of other endocrine, nutritional and metabolic disease: Secondary | ICD-10-CM | POA: Diagnosis not present

## 2024-03-14 DIAGNOSIS — R7303 Prediabetes: Secondary | ICD-10-CM | POA: Diagnosis not present

## 2024-03-14 DIAGNOSIS — E559 Vitamin D deficiency, unspecified: Secondary | ICD-10-CM | POA: Diagnosis not present

## 2024-03-14 DIAGNOSIS — I10 Essential (primary) hypertension: Secondary | ICD-10-CM | POA: Diagnosis not present

## 2024-03-14 DIAGNOSIS — K760 Fatty (change of) liver, not elsewhere classified: Secondary | ICD-10-CM | POA: Diagnosis not present

## 2024-03-29 ENCOUNTER — Ambulatory Visit
Admission: RE | Admit: 2024-03-29 | Discharge: 2024-03-29 | Disposition: A | Discharge status: Home / Self Care | Source: Ambulatory Visit | Attending: Hematology and Oncology | Admitting: Hematology and Oncology

## 2024-03-29 DIAGNOSIS — R928 Other abnormal and inconclusive findings on diagnostic imaging of breast: Secondary | ICD-10-CM | POA: Diagnosis not present

## 2024-03-29 DIAGNOSIS — N6489 Other specified disorders of breast: Secondary | ICD-10-CM | POA: Diagnosis not present

## 2024-06-16 ENCOUNTER — Ambulatory Visit: Admitting: Family Medicine

## 2024-06-16 ENCOUNTER — Encounter: Payer: Self-pay | Admitting: Family Medicine

## 2024-06-16 VITALS — BP 128/80 | HR 70 | Ht 64.0 in | Wt 162.4 lb

## 2024-06-16 DIAGNOSIS — E7800 Pure hypercholesterolemia, unspecified: Secondary | ICD-10-CM | POA: Diagnosis not present

## 2024-06-16 DIAGNOSIS — K760 Fatty (change of) liver, not elsewhere classified: Secondary | ICD-10-CM | POA: Diagnosis not present

## 2024-06-16 DIAGNOSIS — R7303 Prediabetes: Secondary | ICD-10-CM | POA: Diagnosis not present

## 2024-06-16 DIAGNOSIS — Z780 Asymptomatic menopausal state: Secondary | ICD-10-CM

## 2024-06-16 DIAGNOSIS — Z8639 Personal history of other endocrine, nutritional and metabolic disease: Secondary | ICD-10-CM

## 2024-06-16 DIAGNOSIS — E559 Vitamin D deficiency, unspecified: Secondary | ICD-10-CM | POA: Diagnosis not present

## 2024-06-16 DIAGNOSIS — I10 Essential (primary) hypertension: Secondary | ICD-10-CM | POA: Diagnosis not present

## 2024-06-16 DIAGNOSIS — Z6827 Body mass index (BMI) 27.0-27.9, adult: Secondary | ICD-10-CM

## 2024-06-16 NOTE — Progress Notes (Incomplete)
 "    Patient Care Team: Prentiss Frieze, DO as PCP - General (Family Medicine) Odean Potts, MD as Consulting Physician (Hematology and Oncology) Crawford, Morna Pickle, NP as Nurse Practitioner (Hematology and Oncology) Ebbie Cough, MD as Consulting Physician (General Surgery) Shannon Agent, MD as Consulting Physician (Radiation Oncology) Prentiss Frieze, DO as Referring Physician (Family Medicine)  Weight Management:   Starting weight: 195 lb Starting date: 12/16/21 Today's weight: 162 lb Today's date: 06/16/2024 Total lbs lost to date: 33 lb Total lbs lost since last in-office visit: -5lb Total weight loss percentage to date: -16.92% Body mass index is 27.88 kg/m.   Resting Metabolic Rate: *** Nutrition Plan: {EW NUTRITION HNJOD:65522}. Anti-obesity medications (including off-label): ***. Reported side effects: ***. Hunger is {EWCONTROLASSESSMENT:24261}. Cravings are {EWCONTROLASSESSMENT:24261}. Activity: {MWM EXERCISE RECS:23473}. Sleep: Number of hours slept each night: ***. Sleep {ACTION; IS/IS NOT:21021397} restful.   Diagnoses and Orders:   No diagnosis found. No orders of the defined types were placed in this encounter.  No orders of the defined types were placed in this encounter.  Assessment & Plan:   Assessment and Plan Assessment & Plan Metabolic syndrome Insulin  resistance. Weight and fat mass decreased. Engaging in lifestyle modifications. Using Zepbound for weight management and insulin  sensitivity. Discussed potential future use of oral gliptin for maintenance. - Continue Zepbound 7.5 mg weekly. - Encouraged continued lifestyle modifications including physical activity and dietary changes. - Monitor fasting insulin  levels in three months.  Hyperlipidemia Managed with Crestor . Discussed statins' impact on insulin  resistance. Considering reducing Crestor  frequency if fasting insulin  levels improve. - Continue Crestor  5 mg daily. - Consider  reducing Crestor  to three times a week if fasting insulin  levels improve.  Vitamin D  deficiency Previous levels lower than desired. Discussed importance of vitamin D  supplementation and absorption with dietary fat. - Start vitamin D  5000 IU with K daily with food.  General Health Maintenance Routine health maintenance discussed. Mammograms up to date. Bone density scan scheduled. Flu shot received. Colonoscopy not due until age 65. Discussed HPV risk and cervical health. Consideration of vaginal estrogen for pelvic health discussed. - Continue regular mammograms every six months. - Proceed with bone density scan in March. - Consider vaginal estrogen for pelvic health if needed.    Frieze Prentiss, DO, MS, FAAFP, Dipl. [facility] [place] [address] [place], [address] Dept: [phone] Dept Fax: [phone]  Subjective:   History of Present Illness Connie Clarke is a 69 year old female who presents for follow-up on weight management and metabolic health.  Weight management and insulin  resistance - Insulin  resistance managed with Zepbound 7.5 mg weekly, resulting in appetite suppression and weight control. - Weight plateaued at 164 lb; fat mass decreased from 64 to 61 lb since October. - Utilizes fiber supplements and strengthening exercises. - Focuses on healthy eating, including salads and lean protein. - Tends to overeat in social settings, particularly consuming large amounts of sweets. - Interested in improving fasting insulin , previously measured at 19.  Hyperlipidemia - Takes Crestor  5 mg for cholesterol management. - Desires to reduce cholesterol medication dosage.  Breast and cervical health surveillance - Abnormal mammogram last year led to biopsy showing atypical but non-cancerous cells. - Undergoing mammograms every six months for ongoing surveillance. - History of hysterectomy with retained cervix. - Previous abnormal  cervical cells not related to HPV. - No current pelvic concerns or urinary symptoms.  Menopausal status and hormone therapy - Postmenopausal. - Not receiving hormone replacement  therapy.  Hpv exposure risk - Husband had HPV-related throat cancer. - Aware of HPV-associated risks.  Vitamin d  deficiency - History of low vitamin D . - Restarting vitamin D  5000 IU with K, taken with food.  Review of Systems: Negative, with the exception of above mentioned in HPI.  History:   Reviewed by clinician on day of visit: allergies, medications, problem list, medical history, surgical history, family history, social history, and previous encounter notes.  Medications:   Show/hide medication list[1] Allergies[2]  Objective:   BP 128/80 (BP Location: Right Arm, Cuff Size: Normal)   Pulse 70   Ht 5' 4 (1.626 m)   Wt 162 lb 6.4 oz (73.7 kg)   LMP 06/02/2005   SpO2 98%   BMI 27.88 kg/m  {Insert last BP/Wt (optional):23777}{See vitals history (optional):1}   Physical Exam {Insert previous labs (optional):23779} {See past labs  Heme  Chem  Endocrine  Serology  Results Review (optional):1}  Results for orders placed or performed during the hospital encounter of 09/24/23  Surgical pathology   Collection Time: 09/24/23 12:00 AM  Result Value Ref Range   SURGICAL PATHOLOGY      SURGICAL PATHOLOGY [facility] [address], [address] [place], [place] [address] Telephone [phone] or [phone] Fax [phone]  REPORT OF SURGICAL PATHOLOGY   Accession #: [id number] Patient Name: Connie Clarke Visit # : [id number]  MRN: [id number] Physician: Grayson Ned DOB/Age 04/22/1956 (Age: 43) Gender: F Collected Date: 09/24/2023 Received Date: 09/24/2023  FINAL DIAGNOSIS       1. Breast, right, needle core biopsy, 11:00 11cmfn :       BENIGN BREAST TISSUE WITH STROMAL FIBROSIS.      NEGATIVE FOR ATYPIA OR MALIGNANCY.       DATE SIGNED OUT:  09/25/2023 ELECTRONIC SIGNATURE : Pepper Dutton Md, Pathologist, Electronic Signature  MICROSCOPIC DESCRIPTION  CASE COMMENTS STAINS USED IN DIAGNOSIS: H&E-2 H&E-3 H&E-4 H&E    CLINICAL HISTORY  SPECIMEN(S) OBTAINED 1. Breast, right, needle core biopsy, 11:00 11cmfn  SPECIMEN COMMENTS: 1. TIF 1:30 CIT less than 1 min SPECIMEN CLINICAL INFORMATION: 1. 69 yo F with 0.3  upper outer right breast mass    Gross Description 1. Received in formalin labeled with the patient's name and right breast 11 o'clock, 11 cm from nipple upper outer are three cores of fibroadipose tissue ranging from 1.3 to 1.6 cm in length, each measuring 0.1 cm in diameter.  The specimen is entirely submitted in one block.      TIF 1:30 p.m. CIT less than 1 min. (KW:gt, 09/25/23)        Report signed out from the following location(s) [place] [address] [place], [place] [address] CLIA #: [id number]  [facility] [address] [place], [place] [address] CLIA #: [id number]     06/16/2024    PHQ2-9 Depression Screening   Little interest or pleasure in doing things    Feeling down, depressed, or hopeless    PHQ-2 - Total Score    Trouble falling or staying asleep, or sleeping too much    Feeling tired or having little energy    Poor appetite or overeating     Feeling bad about yourself - or that you are a failure or have let yourself or your family down    Trouble concentrating on things, such as reading the newspaper or watching television    Moving or speaking so slowly that other people could  have noticed.  Or the opposite - being so fidgety or restless that you have been moving around a lot more than usual    Thoughts that you would be better off dead, or hurting yourself in some way    PHQ2-9 Total Score    If you checked off any problems, how difficult have these problems made it for you to do your work, take care of things at home, or get along with  other people    Depression Interventions/Treatment         11/01/2018    8:19 AM  GAD 7 : Generalized Anxiety Score  Nervous, Anxious, on Edge 2  Control/stop worrying 1  Worry too much - different things 1  Trouble relaxing 2  Restless 0  Easily annoyed or irritable 1  Afraid - awful might happen 2  Total GAD 7 Score 9  Anxiety Difficulty Somewhat difficult   Attestations:   {EW ATTESTATIONS:34266}  Geni Shutter, DO, MS, FAAFP, Dipl. [facility] [place] [facility] [address] [place], [address] Dept: [phone] Dept Fax: [phone]     [1]  Outpatient Medications Prior to Visit  Medication Sig   acetaminophen  (TYLENOL ) 325 MG tablet Take 650 mg by mouth every 6 (six) hours as needed (FOR PAIN.).   ALPRAZolam  (XANAX ) 0.25 MG tablet Take 1 tablet (0.25 mg total) by mouth at bedtime as needed for anxiety.   Cholecalciferol  (VITAMIN D3) 125 MCG (5000 UT) CAPS 1 capsule   Coenzyme Q10 (COQ10) 400 MG CAPS Take 400 mg by mouth daily.   ibuprofen (ADVIL,MOTRIN) 200 MG tablet Take 200 mg by mouth every 8 (eight) hours as needed (FOR PAIN.).   rosuvastatin  (CRESTOR ) 5 MG tablet Take 1 tablet (5 mg total) by mouth daily.   Tirzepatide-Weight Management (ZEPBOUND) 15 MG/0.5ML SOLN Inject 15 mg into the skin once a week.   lisinopril  (ZESTRIL ) 10 MG tablet Take 10 mg by mouth daily.   [DISCONTINUED] B Complex Vitamins (VITAMIN B COMPLEX) TABS Take 1 tablet by mouth daily.   [DISCONTINUED] Continuous Glucose Sensor (DEXCOM G7 SENSOR) MISC Apply 1 sensor to body once every 10 days to monitor blood sugar levels continuously. for 10 days   [DISCONTINUED] influenza vaccine adjuvanted (FLUAD ) 0.5 ML injection Inject into the muscle.   [DISCONTINUED] letrozole  (FEMARA ) 2.5 MG tablet TAKE 1 TABLET BY MOUTH DAILY   No facility-administered medications prior to visit.  [2]  Allergies Allergen Reactions   Chlorhexidine  Hives    Surgical prep scrub    Nusurgepak Surgical Prep-Care Hives    Other reaction(s): hives   Steri-Strip Compound Benzoin [Benzoin] Hives   "

## 2024-06-27 ENCOUNTER — Encounter: Payer: Self-pay | Admitting: Family Medicine

## 2024-06-27 DIAGNOSIS — Z6827 Body mass index (BMI) 27.0-27.9, adult: Secondary | ICD-10-CM | POA: Insufficient documentation

## 2024-06-27 DIAGNOSIS — R7303 Prediabetes: Secondary | ICD-10-CM | POA: Insufficient documentation

## 2024-06-27 DIAGNOSIS — F50819 Binge eating disorder, unspecified: Secondary | ICD-10-CM | POA: Insufficient documentation

## 2024-06-27 DIAGNOSIS — R87629 Unspecified abnormal cytological findings in specimens from vagina: Secondary | ICD-10-CM | POA: Insufficient documentation

## 2024-06-27 DIAGNOSIS — Z780 Asymptomatic menopausal state: Secondary | ICD-10-CM | POA: Insufficient documentation

## 2024-06-27 DIAGNOSIS — R202 Paresthesia of skin: Secondary | ICD-10-CM | POA: Insufficient documentation

## 2024-06-27 DIAGNOSIS — F419 Anxiety disorder, unspecified: Secondary | ICD-10-CM | POA: Insufficient documentation

## 2024-06-27 DIAGNOSIS — K760 Fatty (change of) liver, not elsewhere classified: Secondary | ICD-10-CM | POA: Insufficient documentation

## 2024-06-27 DIAGNOSIS — G4733 Obstructive sleep apnea (adult) (pediatric): Secondary | ICD-10-CM | POA: Insufficient documentation

## 2024-06-27 DIAGNOSIS — Z8639 Personal history of other endocrine, nutritional and metabolic disease: Secondary | ICD-10-CM | POA: Insufficient documentation

## 2024-06-27 DIAGNOSIS — Z853 Personal history of malignant neoplasm of breast: Secondary | ICD-10-CM | POA: Insufficient documentation

## 2024-06-27 DIAGNOSIS — F329 Major depressive disorder, single episode, unspecified: Secondary | ICD-10-CM | POA: Insufficient documentation

## 2024-06-27 MED ORDER — ZEPBOUND 15 MG/0.5ML ~~LOC~~ SOLN: 15.0000 mg | SUBCUTANEOUS | 6 refills | Status: AC

## 2024-06-27 NOTE — Progress Notes (Signed)
 "    Patient Care Team: Prentiss Frieze, DO as PCP - General (Family Medicine) Odean Potts, MD as Consulting Physician (Hematology and Oncology) Crawford, Morna Pickle, NP as Nurse Practitioner (Hematology and Oncology) Ebbie Cough, MD as Consulting Physician (General Surgery) Shannon Agent, MD as Consulting Physician (Radiation Oncology) Prentiss Frieze, DO as Referring Physician (Family Medicine)  Weight Management:   Starting weight: 195 lb Starting date: 12/16/21 Today's weight: 162 lb Today's date: 06/27/2024 Total lbs lost to date: 33 lb Total lbs lost since last in-office visit: -5lb Total weight loss percentage to date: -16.92% Body mass index is 27.88 kg/m.   Nutrition Plan: Eat protein with every meal/snack , Prioritize whole, minimally processed foods , and Mindful portions. Anti-obesity medications (including off-label): Zepbound . Reported side effects: None.. Hunger is moderately controlled. Cravings are moderately controlled.  Patient is under the care of an Obesity Medicine-certified physician providing longitudinal care using a comprehensive, pillar-based obesity treatment model (nutrition therapy, physical activity counseling, behavioral modification, pharmacotherapy when indicated, and medical monitoring), consistent with standards outlined by the Obesity Medicine Association. Obesity is treated as a medical disease, not a cosmetic condition. Weight, BMI, waist circumference, blood pressure, relevant metabolic markers (as applicable), medication tolerability, and adherence will be monitored at regular follow-up intervals. Discontinuation after initial response is associated with weight regain and disease progression. Ongoing therapy is medically appropriate for chronic disease management. Medication is prescribed for treatment of chronic obesity disease and associated medical conditions, not for cosmetic weight loss.   Frieze Prentiss, DO, MS (Nutrition), FAAFP,  Dipl. ABOM Fellow, American Academy of Family Physicians Diplomate, American Board of Obesity Medicine [facility] at [facility] [address] [place], [place] [address] Dept: [phone] Fax: [phone]  Diagnoses and Orders:   1. Hypercholesterolemia   2. Prediabetes   3. Essential hypertension   4. Metabolic dysfunction-associated steatotic liver disease (MASLD)   5. Vitamin D  deficiency   6. Postmenopausal, GYN is Dr. Mat, DXA and mammogram   7. History of obesity   8. Body mass index (BMI) of 27.0 to 27.9 in adult    Meds ordered this encounter  Medications   Tirzepatide -Weight Management (ZEPBOUND ) 15 MG/0.5ML SOLN    Sig: Inject 15 mg into the skin once a week.    Dispense:  2 mL    Refill:  6   Assessment & Plan:   Assessment & Plan Metabolic syndrome Insulin  resistance. Weight and fat mass decreased. Engaging in lifestyle modifications. Using Zepbound  for weight management and insulin  sensitivity.  - Continue Zepbound  7.5 mg weekly. - Encouraged continued lifestyle modifications including physical activity and dietary changes. - Monitor fasting insulin  levels in three months.  Hyperlipidemia Managed with Crestor .  - Continue Crestor  5 mg daily.  Vitamin D  deficiency Previous levels lower than desired. Discussed importance of vitamin D  supplementation and absorption with dietary fat. - Start vitamin D  5000 IU with K daily with food.  General Health Maintenance Routine health maintenance discussed. Mammograms up to date. Bone density scan scheduled. Flu shot received. Colonoscopy not due until age 36. Discussed HPV risk and cervical health. Consideration of vaginal estrogen for pelvic health discussed. - Continue regular mammograms every six months. - Proceed with bone density scan in March. - Consider vaginal estrogen for pelvic health if needed.  Subjective:   History of Present Illness Connie Clarke is a 69  year old female who presents for follow-up on weight management and metabolic health.  Weight management and  insulin  resistance - Insulin  resistance managed with Zepbound  7.5 mg weekly, resulting in appetite suppression and weight control. - Weight plateaued at 164 lb; fat mass decreased from 64 to 61 lb since October. - Utilizes fiber supplements and strengthening exercises. - Focuses on healthy eating, including salads and lean protein. - Tends to overeat in social settings, particularly consuming large amounts of sweets. - Interested in improving fasting insulin , previously measured at 19.  Hyperlipidemia - Takes Crestor  5 mg for cholesterol management. - Desires to reduce cholesterol medication dosage.  Breast and cervical health surveillance - Abnormal mammogram last year led to biopsy showing atypical but non-cancerous cells. - Undergoing mammograms every six months for ongoing surveillance. - History of hysterectomy with retained cervix. - Previous abnormal cervical cells not related to HPV. - No current pelvic concerns or urinary symptoms.  Menopausal status and hormone therapy - Postmenopausal. - Not receiving hormone replacement therapy.  Hpv exposure risk - Husband had HPV-related throat cancer. - Aware of HPV-associated risks.  Vitamin d  deficiency - History of low vitamin D . - Restarting vitamin D  5000 IU with K, taken with food.  Review of Systems: Negative, with the exception of above mentioned in HPI.  History:   Reviewed by clinician on day of visit: allergies, medications, problem list, medical history, surgical history, family history, social history, and previous encounter notes.  Medications:   Show/hide medication list[1] Allergies[2]  Objective:   BP 128/80 (BP Location: Right Arm, Cuff Size: Normal)   Pulse 70   Ht 5' 4 (1.626 m)   Wt 162 lb 6.4 oz (73.7 kg)   LMP 06/02/2005   SpO2 98%   BMI 27.88 kg/m   Physical Exam Constitutional:       General: She is not in acute distress.    Appearance: She is well-developed.  HENT:     Head: Normocephalic and atraumatic.  Eyes:     Conjunctiva/sclera: Conjunctivae normal.  Cardiovascular:     Rate and Rhythm: Normal rate and regular rhythm.     Heart sounds: Normal heart sounds.  Pulmonary:     Effort: Pulmonary effort is normal.     Breath sounds: Normal breath sounds.  Neurological:     General: No focal deficit present.     Mental Status: She is alert.  Psychiatric:        Behavior: Behavior normal.    Attestations:   Patient is well-known to me from previous care setting and is establishing care in this system with me as PCP. Available records reviewed. Chart updated today with reconciliation of problem list, medications, allergies, and relevant history. Preventive care and chronic disease status reviewed.  Outside labs reviewed and will be abstracted. Portions of historical chart may remain incomplete; will update on an ongoing basis as clinically indicated.  Reviewed by clinician on day of visit: allergies, medications, problem list, medical history, surgical history, family history, social history, and previous encounter notes. Discussed the use of AI scribe software for clinical note transcription with the patient, who gave verbal consent to proceed. As the patient's primary care physician, board-certified in Family Medicine and Obesity Medicine, I am providing ongoing, comprehensive obesity care based on the pillars of obesity medicine, including nutrition therapy, physical activity, behavioral modification, and pharmacologic treatment.  Geni Shutter, DO, MS, FAAFP, Dipl. [facility] at [facility] [address] [place], [address] Dept: [phone] Dept Fax: [phone]      [1]  Outpatient Medications Prior to Visit  Medication  Sig   acetaminophen  (TYLENOL ) 325 MG tablet Take 650 mg by mouth every 6 (six) hours as needed  (FOR PAIN.).   ALPRAZolam  (XANAX ) 0.25 MG tablet Take 1 tablet (0.25 mg total) by mouth at bedtime as needed for anxiety.   Cholecalciferol  (VITAMIN D3) 125 MCG (5000 UT) CAPS 1 capsule   Coenzyme Q10 (COQ10) 400 MG CAPS Take 400 mg by mouth daily.   ibuprofen (ADVIL,MOTRIN) 200 MG tablet Take 200 mg by mouth every 8 (eight) hours as needed (FOR PAIN.).   rosuvastatin  (CRESTOR ) 5 MG tablet Take 1 tablet (5 mg total) by mouth daily.   lisinopril  (ZESTRIL ) 10 MG tablet Take 10 mg by mouth daily.   [DISCONTINUED] B Complex Vitamins (VITAMIN B COMPLEX) TABS Take 1 tablet by mouth daily.   [DISCONTINUED] Continuous Glucose Sensor (DEXCOM G7 SENSOR) MISC Apply 1 sensor to body once every 10 days to monitor blood sugar levels continuously. for 10 days   [DISCONTINUED] influenza vaccine adjuvanted (FLUAD ) 0.5 ML injection Inject into the muscle.   [DISCONTINUED] letrozole  (FEMARA ) 2.5 MG tablet TAKE 1 TABLET BY MOUTH DAILY   [DISCONTINUED] Tirzepatide -Weight Management (ZEPBOUND ) 15 MG/0.5ML SOLN Inject 15 mg into the skin once a week.   No facility-administered medications prior to visit.  [2]  Allergies Allergen Reactions   Chlorhexidine  Hives    Surgical prep scrub   Nusurgepak Surgical Prep-Care Hives    Other reaction(s): hives   Steri-Strip Compound Benzoin [Benzoin] Hives   "

## 2024-07-05 ENCOUNTER — Other Ambulatory Visit (HOSPITAL_COMMUNITY): Payer: Self-pay

## 2024-07-05 ENCOUNTER — Telehealth: Payer: Self-pay | Admitting: Pharmacy Technician

## 2024-07-05 NOTE — Telephone Encounter (Signed)
 Pharmacy Patient Advocate Encounter   Received notification from [facility] KEY that prior authorization for Zepbound  15mg  is required/requested.   Insurance verification completed.   The patient is insured through [facility] MEDICAID.   Per test claim, medication is not covered due to plan/benefit exclusion, PA not submitted at this time. Archived Key: [id number]  It appears the pt is paing for medication out of pocket through [employer] order.

## 2024-09-06 ENCOUNTER — Encounter: Admitting: Family Medicine

## 2024-10-04 ENCOUNTER — Encounter: Admitting: Family Medicine
